# Patient Record
Sex: Female | Born: 1955 | Race: White | Hispanic: No | Marital: Married | Smoking: Current every day smoker
Health system: Southern US, Community
[De-identification: ages and names within clinical notes are randomized; demographics above are authoritative.]

## PROBLEM LIST (undated history)

## (undated) DIAGNOSIS — R7303 Prediabetes: Secondary | ICD-10-CM

## (undated) DIAGNOSIS — R112 Nausea with vomiting, unspecified: Secondary | ICD-10-CM

## (undated) DIAGNOSIS — Z8489 Family history of other specified conditions: Secondary | ICD-10-CM

## (undated) DIAGNOSIS — I10 Essential (primary) hypertension: Secondary | ICD-10-CM

## (undated) DIAGNOSIS — M1990 Unspecified osteoarthritis, unspecified site: Secondary | ICD-10-CM

## (undated) DIAGNOSIS — Z9889 Other specified postprocedural states: Secondary | ICD-10-CM

## (undated) HISTORY — PX: NECK SURGERY: SHX720

## (undated) HISTORY — PX: TONSILLECTOMY: SUR1361

## (undated) HISTORY — PX: SALPINGOOPHORECTOMY: SHX82

## (undated) HISTORY — PX: TUBAL LIGATION: SHX77

## (undated) HISTORY — PX: APPENDECTOMY: SHX54

## (undated) HISTORY — DX: Essential (primary) hypertension: I10

---

## 1997-07-06 HISTORY — PX: LIPOMA EXCISION: SHX5283

## 2004-04-05 ENCOUNTER — Encounter: Payer: Self-pay | Admitting: General Practice

## 2004-05-09 ENCOUNTER — Ambulatory Visit: Payer: Self-pay | Admitting: General Practice

## 2004-05-19 ENCOUNTER — Encounter: Payer: Self-pay | Admitting: Unknown Physician Specialty

## 2004-06-05 ENCOUNTER — Encounter: Payer: Self-pay | Admitting: Unknown Physician Specialty

## 2004-07-06 ENCOUNTER — Encounter: Payer: Self-pay | Admitting: Unknown Physician Specialty

## 2004-08-06 ENCOUNTER — Encounter: Payer: Self-pay | Admitting: Unknown Physician Specialty

## 2004-09-03 ENCOUNTER — Encounter: Payer: Self-pay | Admitting: Unknown Physician Specialty

## 2004-10-04 ENCOUNTER — Encounter: Payer: Self-pay | Admitting: Unknown Physician Specialty

## 2004-11-03 ENCOUNTER — Encounter: Payer: Self-pay | Admitting: Unknown Physician Specialty

## 2004-12-04 ENCOUNTER — Encounter: Payer: Self-pay | Admitting: Unknown Physician Specialty

## 2005-01-03 ENCOUNTER — Encounter: Payer: Self-pay | Admitting: Unknown Physician Specialty

## 2005-02-03 ENCOUNTER — Encounter: Payer: Self-pay | Admitting: Unknown Physician Specialty

## 2005-03-06 ENCOUNTER — Encounter: Payer: Self-pay | Admitting: Unknown Physician Specialty

## 2005-04-05 ENCOUNTER — Encounter: Payer: Self-pay | Admitting: Unknown Physician Specialty

## 2005-05-06 ENCOUNTER — Encounter: Payer: Self-pay | Admitting: Unknown Physician Specialty

## 2005-07-06 HISTORY — PX: KNEE SURGERY: SHX244

## 2008-11-07 ENCOUNTER — Ambulatory Visit: Payer: Self-pay

## 2008-11-20 ENCOUNTER — Encounter: Payer: Self-pay | Admitting: General Practice

## 2008-12-04 ENCOUNTER — Encounter: Payer: Self-pay | Admitting: General Practice

## 2009-01-03 ENCOUNTER — Encounter: Payer: Self-pay | Admitting: General Practice

## 2009-02-03 ENCOUNTER — Encounter: Payer: Self-pay | Admitting: General Practice

## 2009-03-06 ENCOUNTER — Encounter: Payer: Self-pay | Admitting: General Practice

## 2012-07-06 HISTORY — PX: ABDOMINAL HYSTERECTOMY: SHX81

## 2012-07-06 HISTORY — PX: HERNIA REPAIR: SHX51

## 2012-12-02 ENCOUNTER — Ambulatory Visit: Payer: Self-pay | Admitting: General Practice

## 2012-12-06 ENCOUNTER — Ambulatory Visit: Payer: Self-pay | Admitting: Gynecologic Oncology

## 2012-12-14 ENCOUNTER — Ambulatory Visit: Payer: Self-pay | Admitting: Gynecologic Oncology

## 2012-12-14 LAB — BASIC METABOLIC PANEL
BUN: 11 mg/dL (ref 7–18)
Calcium, Total: 9 mg/dL (ref 8.5–10.1)
Chloride: 108 mmol/L — ABNORMAL HIGH (ref 98–107)
Co2: 28 mmol/L (ref 21–32)
EGFR (African American): >60
EGFR (Non-African Amer.): >60
Osmolality: 278 (ref 275–301)
Potassium: 3.6 mmol/L (ref 3.5–5.1)

## 2012-12-14 LAB — CBC
HCT: 42.2 % (ref 35.0–47.0)
HGB: 14.5 g/dL (ref 12.0–16.0)
MCHC: 34.2 g/dL (ref 32.0–36.0)
Platelet: 187 10*3/uL (ref 150–440)
RDW: 14.2 % (ref 11.5–14.5)
WBC: 6.7 10*3/uL (ref 3.6–11.0)

## 2012-12-20 ENCOUNTER — Inpatient Hospital Stay: Payer: Self-pay | Admitting: Surgery

## 2012-12-21 LAB — BASIC METABOLIC PANEL
Chloride: 104 mmol/L (ref 98–107)
Co2: 28 mmol/L (ref 21–32)
EGFR (African American): >60
EGFR (Non-African Amer.): >60
Glucose: 135 mg/dL — ABNORMAL HIGH (ref 65–99)
Osmolality: 274 (ref 275–301)
Potassium: 4 mmol/L (ref 3.5–5.1)
Sodium: 137 mmol/L (ref 136–145)

## 2012-12-21 LAB — CBC WITH DIFFERENTIAL/PLATELET
Basophil #: 0 10*3/uL (ref 0.0–0.1)
Eosinophil #: 0 10*3/uL (ref 0.0–0.7)
HGB: 13 g/dL (ref 12.0–16.0)
Lymphocyte #: 1.1 10*3/uL (ref 1.0–3.6)
MCH: 29.9 pg (ref 26.0–34.0)
MCHC: 33.8 g/dL (ref 32.0–36.0)
MCV: 88 fL (ref 80–100)
Monocyte %: 7.2 %
Neutrophil #: 10.7 10*3/uL — ABNORMAL HIGH (ref 1.4–6.5)
Neutrophil %: 84.2 %
Platelet: 173 10*3/uL (ref 150–440)
RBC: 4.35 10*6/uL (ref 3.80–5.20)
WBC: 12.7 10*3/uL — ABNORMAL HIGH (ref 3.6–11.0)

## 2013-01-03 ENCOUNTER — Ambulatory Visit: Payer: Self-pay | Admitting: Gynecologic Oncology

## 2013-02-03 ENCOUNTER — Ambulatory Visit: Payer: Self-pay | Admitting: Gynecologic Oncology

## 2013-02-16 ENCOUNTER — Ambulatory Visit: Payer: Self-pay | Admitting: Gynecologic Oncology

## 2013-02-25 ENCOUNTER — Emergency Department: Payer: Self-pay | Admitting: Emergency Medicine

## 2013-02-25 LAB — URINALYSIS, COMPLETE
Bilirubin,UR: NEGATIVE
Blood: NEGATIVE
Leukocyte Esterase: NEGATIVE
Nitrite: NEGATIVE
Protein: NEGATIVE
RBC,UR: 1 /HPF (ref 0–5)
WBC UR: 1 /HPF (ref 0–5)

## 2013-02-25 LAB — CBC
HCT: 44.6 % (ref 35.0–47.0)
HGB: 15.2 g/dL (ref 12.0–16.0)
MCH: 30.2 pg (ref 26.0–34.0)
MCV: 89 fL (ref 80–100)
RBC: 5.03 10*6/uL (ref 3.80–5.20)

## 2013-02-25 LAB — COMPREHENSIVE METABOLIC PANEL
Albumin: 3.7 g/dL (ref 3.4–5.0)
Anion Gap: 5 — ABNORMAL LOW (ref 7–16)
BUN: 17 mg/dL (ref 7–18)
Calcium, Total: 9 mg/dL (ref 8.5–10.1)
Co2: 25 mmol/L (ref 21–32)
EGFR (Non-African Amer.): >60
Glucose: 121 mg/dL — ABNORMAL HIGH (ref 65–99)
Osmolality: 282 (ref 275–301)
Potassium: 3.5 mmol/L (ref 3.5–5.1)
Sodium: 140 mmol/L (ref 136–145)
Total Protein: 6.9 g/dL (ref 6.4–8.2)

## 2013-02-25 LAB — TROPONIN I: Troponin-I: <0.02 ng/mL

## 2013-08-01 LAB — LIPID PANEL
CHOLESTEROL: 191 mg/dL (ref 0–200)
HDL: 29 mg/dL — AB (ref 35–70)
Triglycerides: 261 mg/dL — AB (ref 40–160)

## 2013-08-01 LAB — HEPATIC FUNCTION PANEL
ALT: 26 U/L (ref 7–35)
AST: 20 U/L (ref 13–35)

## 2013-08-01 LAB — CBC AND DIFFERENTIAL
HEMATOCRIT: 43 % (ref 36–46)
Hemoglobin: 14.5 g/dL (ref 12.0–16.0)
Neutrophils Absolute: 6 /uL
PLATELETS: 225 10*3/uL (ref 150–399)
WBC: 10.3 10*3/mL

## 2013-08-01 LAB — BASIC METABOLIC PANEL
BUN: 24 mg/dL — AB (ref 4–21)
Creatinine: 1.5 mg/dL — AB (ref 0.5–1.1)
GLUCOSE: 84 mg/dL
Potassium: 3.9 mmol/L (ref 3.4–5.3)
Sodium: 141 mmol/L (ref 137–147)

## 2013-08-01 LAB — TSH: TSH: 3.05 u[IU]/mL (ref 0.41–5.90)

## 2014-10-26 NOTE — Discharge Summary (Signed)
PATIENT NAME:  [name], [name] MR#:  [id number] DATE OF BIRTH:  April 21, 1956  DATE OF ADMISSION:  06/17/201406/17/2014 DATE OF DISCHARGE:  12/24/2012  BRIEF HISTORY:  Ruth Higgins is a 59 year old woman recently identified with a large intra-abdominal mass, suspected to be related to her ovary. It measured approximately 40 cm in width. It appeared to be a large multiseptated lesion. Initial concern was for possible ovarian neoplasm she was seen by Dr. Jacquelyne Balint in the gynecologic oncology division. Dr. Sabra Heck recommended surgical intervention. After appropriate preoperative preparation and informed consent, she was taken to surgery on the morning of 12/20/2012 where she underwent a laparotomy. The lesion was removed without difficulty. A total abdominal hysterectomy and bilateral salpingo-oophorectomy was performed. The patient is admitted to the hospital. Postoperative course was largely unremarkable. She _____ return of bowel function, tolerating a regular diet with no particular complaints. The wounds look good. There is no sign of any infection. She is discharged home today, to be followed in the cancer center in 1 to 2 weeks.   DISCHARGE MEDICATIONS INCLUDE:   1.  Percocet 5/325 every 6 hours p.r.n.  2.  Nicotine patch 7 mg transdermally daily x 2 weeks p.r.n.   FINAL DISCHARGE DIAGNOSIS:  Ovarian tumor. Pathology pending.   SURGERY:  Transabdominal hysterectomy with bilateral salpingo-oophorectomy.  ____________________________ Micheline Maze, MD rle:nts D: 12/24/2012 06:48:00 ET T: 12/24/2012 07:29:17 ET JOB#: [id number]  cc: Weber Cooks, MD Nelda Severe. Burt Ek, MD Rodena Goldmann III, MD, <Dictator>  Rodena Goldmann MD ELECTRONICALLY SIGNED 12/25/2012 20:09

## 2014-10-26 NOTE — Op Note (Signed)
PATIENT NAME:  Ruth Higgins, Ruth Higgins MR#:  [id number] DATE OF BIRTH:  29-Jan-1956  DATE OF PROCEDURE:  12/20/2012  PREOPERATIVE DIAGNOSIS: Mainly cystic pelvic abdominal mass.   POSTOPERATIVE DIAGNOSIS: Simple cystadenoma of the left ovary.   PROCEDURE PERFORMED: Exploratory laparotomy. Total abdominal hysterectomy with bilateral salpingo-oophorectomy and appendectomy.   SURGEON: Weber Cooks, M.D.   ASSISTANT:  Dr. Pat Patrick.  ANESTHESIA: General.   COMPLICATIONS: None.   ESTIMATED BLOOD LOSS: 100 mL.   INDICATION FOR SURGERY: The patient is a 59 year old patient who presented with a large pelvic mass reaching into the abdomen. CT scan revealed a mainly cystic structure without evidence of extra ovarian disease. Therefore, the decision was made to proceed with surgery.   FINDINGS AT TIME OF SURGERY: Large mainly cystic enlargement of the left ovary. No adhesions. No papillations, no excrescences. The tumor was mobile. The right adnexa within normal limits. Uterus within normal limits. No peritoneal lesions or other pathology seen or felt in the pelvis and upper abdomen.   OPERATIVE REPORT: After adequate general anesthesia had been obtained, the patient was prepped and draped in supine position. A midline incision was placed with a sharp knife and carried down through the fascia. The peritoneum was entered. The incision was extended cephalad and caudad. Exploration was performed with the above-mentioned findings. Pelvic cytology was obtained. A Balfour retractor was placed. Then, the cystic tumor was visualized. A pursestring suture was then placed into the cyst through a 5 mm port over 4 liters of fluids were removed without spillage.  Then the left adnexa could be exteriorized. The round ligament on the left side was stitch ligated and transected. The pelvic sidewall was entered. Vessels and ureter were identified. The infundibulopelvic ligament was clamped, cut and cut. The utero-ovarian  ligament was clamped and cut and the tumor was sent for frozen section analysis. The utero-ovarian ligament was simply ligated using 0 Vicryl. The infundibulopelvic ligament was then ligated and stitch ligated again using 0 Vicryl. The bowel was then packed away from the pelvic cavity. The round ligament on the right side was stitch ligated and transected. The pelvic sidewall was entered. Vessels and ureter were identified. The infundibulopelvic ligament was clamped, cut, simply ligated and stitch ligated using 0 Vicryl. The adnexa were mobilized towards the uterus. Then the anterior fold of the peritoneum was incised. The bladder was freed from lower uterine segment, cervix, and upper vagina. Dissection was advanced as necessary. The uterine vessels were skeletonized on either side, clamped, cut and stitch ligated with 0 Vicryl. The uterus was then freed from the remainder of the cardinal and uterosacral ligaments by serially placing clamps and cutting pedicles which were stitch ligated with 0 Vicryl. The last two pedicles contained the vaginal fornix. Thus, the uterus and right adnexa were removed completely. The vagina was closed with interrupted sutures using 0 Vicryl. Irrigation of the pelvis was performed and adequate hemostasis was confirmed.   Frozen section revealed a mucinous tumor. Therefore, the decision was made to proceed with appendectomy. The appendix appeared normal. The mesenteriolum was clamped in pedicles, cut and ligated. The base of the appendix was ligated x 2, using 0 Vicryl. Two pursestring sutures were placed. The appendix was then removed and the stump buried in the pursestring suture. Hemostasis was noted to be adequate.   Lap, sponges and retractors were removed. Again, hemostasis was noted to be adequate in all areas. The fascia was closed with a running loop PDS suture starting from the top  as well as the bottom and joining in the midline. Irrigation of the subcutaneous tissue was  obtained and adequate hemostasis confirmed before it was reapproximated with 2-0 Vicryl. 4-0 Vicryl was used to close the skin in a subcuticular fashion. Dermabond was applied.   The patient tolerated the procedure well and was taken to the recovery room in satisfactory condition. Postoperative urine was clear. Pad, sponge, needle, and instrument counts were correct x 2.     ____________________________ Weber Cooks, MD bem:rw D: 12/21/2012 10:30:34 ET T: 12/21/2012 12:19:44 ET JOB#: [id number]  cc: Weber Cooks, MD, <Dictator> Weber Cooks MD ELECTRONICALLY SIGNED 12/27/2012 8:47

## 2015-03-15 DIAGNOSIS — F419 Anxiety disorder, unspecified: Secondary | ICD-10-CM | POA: Insufficient documentation

## 2015-03-15 DIAGNOSIS — J309 Allergic rhinitis, unspecified: Secondary | ICD-10-CM | POA: Insufficient documentation

## 2015-03-15 DIAGNOSIS — I10 Essential (primary) hypertension: Secondary | ICD-10-CM | POA: Insufficient documentation

## 2015-03-15 DIAGNOSIS — Z780 Asymptomatic menopausal state: Secondary | ICD-10-CM | POA: Insufficient documentation

## 2015-03-15 DIAGNOSIS — E785 Hyperlipidemia, unspecified: Secondary | ICD-10-CM | POA: Insufficient documentation

## 2015-03-15 DIAGNOSIS — Z720 Tobacco use: Secondary | ICD-10-CM | POA: Insufficient documentation

## 2015-03-15 DIAGNOSIS — F320 Major depressive disorder, single episode, mild: Secondary | ICD-10-CM | POA: Insufficient documentation

## 2015-03-15 DIAGNOSIS — M1712 Unilateral primary osteoarthritis, left knee: Secondary | ICD-10-CM | POA: Insufficient documentation

## 2015-03-15 DIAGNOSIS — G4700 Insomnia, unspecified: Secondary | ICD-10-CM | POA: Insufficient documentation

## 2015-03-15 DIAGNOSIS — Z8271 Family history of polycystic kidney: Secondary | ICD-10-CM | POA: Insufficient documentation

## 2015-03-18 ENCOUNTER — Encounter: Payer: Self-pay | Admitting: Family Medicine

## 2015-03-18 ENCOUNTER — Ambulatory Visit (INDEPENDENT_AMBULATORY_CARE_PROVIDER_SITE_OTHER): Payer: 59 | Admitting: Family Medicine

## 2015-03-18 VITALS — BP 140/84 | HR 64 | Temp 98.4°F | Resp 16 | Wt 236.0 lb

## 2015-03-18 DIAGNOSIS — I10 Essential (primary) hypertension: Secondary | ICD-10-CM | POA: Diagnosis not present

## 2015-03-18 DIAGNOSIS — F419 Anxiety disorder, unspecified: Secondary | ICD-10-CM | POA: Diagnosis not present

## 2015-03-18 DIAGNOSIS — R7309 Other abnormal glucose: Secondary | ICD-10-CM | POA: Diagnosis not present

## 2015-03-18 DIAGNOSIS — R7303 Prediabetes: Secondary | ICD-10-CM

## 2015-03-18 MED ORDER — LORAZEPAM 0.5 MG PO TABS: 0.5000 mg | ORAL_TABLET | Freq: Every day | ORAL | Status: DC

## 2015-03-18 NOTE — Progress Notes (Signed)
Patient ID: Ruth Higgins, female   DOB: 1956-06-10, 59 y.o.   MRN: [id number]       Patient: Ruth Higgins Female    DOB: December 10, 1955   59 y.o.   MRN: [id number] Visit Date: 03/18/2015  Today's Provider: [name], MD   Chief Complaint  Patient presents with  . Hypertension    follow-up  . Anxiety    follow-up  . glucose intolerance    follow-up   Subjective:    Hypertension This is a chronic problem. The current episode started more than 1 year ago. The problem has been gradually improving since onset. The problem is controlled. Associated symptoms include anxiety and sweats. Pertinent negatives include no blurred vision, chest pain, headaches, palpitations or shortness of breath. (Anxiety as much" pt stated) Risk factors for coronary artery disease include obesity (pre-diabetes). Past treatments include beta blockers (metoprolol xl 25 mg daily).  Anxiety Presents for follow-up visit. Patient reports no chest pain, insomnia, irritability, nervous/anxious behavior, palpitations or shortness of breath. The quality of sleep is fair.     patient is feeling much less stress since retiring from [employer] [facility] [employer] [employer] [facility] pediatrics in the past month. 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 She is having also marital issues and has moved out and living with her husband of 8 years. She denies any physical or verbal abuse. She is very nonchalant about the situation.  Labs were ordered on 09/20/2014 which were not drawn.  Last bas on 08/01/2013      Allergies  Allergen Reactions  . Ceclor  [Cefaclor] Hives  . Demerol  [Meperidine]   . Erythromycin   . Flagyl  [Metronidazole]   . Hydrochlorothiazide     severe cramping in her legs/knee/calves  . Hydrocodone-Acetaminophen   . Penicillin G Hives  . Penicillins   . Sulfa Antibiotics Swelling   Previous Medications   IBUPROFEN (ADVIL,MOTRIN) 400 MG TABLET    Take by mouth.   LISINOPRIL (PRINIVIL,ZESTRIL) 40 MG TABLET    Take by mouth.   LORAZEPAM (ATIVAN) 0.5 MG TABLET    Take by mouth.   METOPROLOL SUCCINATE (TOPROL-XL) 25 MG 24 HR TABLET    Take by mouth.   VENLAFAXINE (EFFEXOR) 75 MG TABLET    Take by mouth.    Review of Systems  Constitutional: Negative.  Negative for irritability.  HENT: Negative.        After going to sleep, a lot of saliva, at night" pt stated  Eyes: Negative.  Negative for blurred vision.  Respiratory: Positive for cough. Negative for shortness of breath.   Cardiovascular: Positive for leg swelling. Negative for chest pain and palpitations.  Endocrine: Negative.   Allergic/Immunologic: Negative.   Neurological: Negative for headaches.  Hematological: Negative.   Psychiatric/Behavioral: Negative.  The patient is not nervous/anxious and does not have insomnia.     Social History  Substance Use Topics  . Smoking status: Current Every Day Smoker -- 1.00 packs/day for 45 years    Types: Cigarettes  . Smokeless tobacco: Not on file  . Alcohol Use: No   Objective:   BP 140/84 mmHg  Pulse 64  Temp(Src) 98.4 F (36.9 C) (Oral)  Resp 16  Wt 236 lb (107.049 kg)  Physical Exam  Constitutional: She is oriented to person, place, and time. She appears well-developed and well-nourished.  Obese white female in no acute distress. Pear shaped.  HENT:  Head: Normocephalic and atraumatic.  Right Ear: External ear normal.  Left Ear: External ear normal.  Nose: Nose normal.  Eyes: Conjunctivae are normal.  Neck: Neck supple.  Cardiovascular: Normal rate, regular rhythm and normal heart sounds.   Pulmonary/Chest: Effort normal and breath sounds normal.  Abdominal: Soft.  Neurological: She is alert and oriented to person, place, and time.  Skin: Skin is warm and dry.  Psychiatric: She has a normal mood and affect. Her behavior is normal. Thought content normal.        Assessment & Plan:     1. Essential  hypertension Improving  2. Anxiety Markedly improved - LORazepam (ATIVAN) 0.5 MG tablet; Take 1 tablet (0.5 mg total) by mouth at bedtime.  Dispense: 30 tablet; Refill: 5  3. Glucose intolerance (pre-diabetes)  Patient instructed to work hard on diet exercise and weight loss now that she has time from her work schedule.  4. Anxiety, mild  5. Obesity 6. Osteoarthritis       [name], MD  [facility]

## 2015-03-28 ENCOUNTER — Encounter: Payer: Self-pay | Admitting: Family Medicine

## 2015-05-20 ENCOUNTER — Ambulatory Visit (INDEPENDENT_AMBULATORY_CARE_PROVIDER_SITE_OTHER): Payer: 59 | Admitting: Family Medicine

## 2015-05-20 ENCOUNTER — Encounter: Payer: Self-pay | Admitting: Family Medicine

## 2015-05-20 VITALS — BP 132/84 | HR 62 | Temp 97.7°F | Resp 16 | Wt 231.0 lb

## 2015-05-20 DIAGNOSIS — E668 Other obesity: Secondary | ICD-10-CM | POA: Diagnosis not present

## 2015-05-20 DIAGNOSIS — E785 Hyperlipidemia, unspecified: Secondary | ICD-10-CM

## 2015-05-20 DIAGNOSIS — R739 Hyperglycemia, unspecified: Secondary | ICD-10-CM | POA: Diagnosis not present

## 2015-05-20 DIAGNOSIS — F320 Major depressive disorder, single episode, mild: Secondary | ICD-10-CM | POA: Diagnosis not present

## 2015-05-20 DIAGNOSIS — IMO0002 Reserved for concepts with insufficient information to code with codable children: Secondary | ICD-10-CM

## 2015-05-20 DIAGNOSIS — I10 Essential (primary) hypertension: Secondary | ICD-10-CM | POA: Diagnosis not present

## 2015-05-20 NOTE — Progress Notes (Signed)
Patient ID: Ruth Higgins, female   DOB: [birth year], 59 y.o.   MRN: [id number]    Subjective:  HPI  Hypertension, follow-up:  BP Readings from Last 3 Encounters:  05/20/15 132/84  03/18/15 140/84  10/01/14 152/90    She was last seen for hypertension 2 months ago.  BP at that visit was 140/84. Management since that visit includes none. She reports good compliance with treatment. She is not having side effects.  She is exercising. Yoga once a week. Outside blood pressures are not being checked. Patient denies chest pain, chest pressure/discomfort, dyspnea, exertional chest pressure/discomfort, fatigue, irregular heart beat, lower extremity edema and palpitations.   Cardiovascular risk factors include dyslipidemia, hypertension, obesity (BMI >= 30 kg/m2) and smoking/ tobacco exposure.    Wt Readings from Last 3 Encounters:  05/20/15 231 lb (104.781 kg)  03/18/15 236 lb (107.049 kg)  10/01/14 219 lb (99.338 kg)   ------------------------------------------------------------------------ Pt reports that she is tired all the time.     Prior to Admission medications   Medication Sig Start Date End Date Taking? Authorizing Provider  ibuprofen (ADVIL,MOTRIN) 400 MG tablet Take by mouth.   Yes Historical Provider, MD  lisinopril (PRINIVIL,ZESTRIL) 40 MG tablet Take by mouth. 06/12/14  Yes Historical Provider, MD  LORazepam (ATIVAN) 0.5 MG tablet Take 1 tablet (0.5 mg total) by mouth at bedtime. 03/18/15  Yes Bodie Abernethy Maceo Pro., MD  metoprolol succinate (TOPROL-XL) 25 MG 24 hr tablet Take by mouth. 08/21/14  Yes Historical Provider, MD  venlafaxine (EFFEXOR) 75 MG tablet Take by mouth. 06/12/14  Yes Historical Provider, MD    Patient Active Problem List   Diagnosis Date Noted  . Anxiety, mild 03/15/2015  . Allergic rhinitis 03/15/2015  . Depression, major, single episode, mild ([place]) 03/15/2015  . Essential (primary) hypertension 03/15/2015  . Fam hx-polycystic kidney  03/15/2015  . HLD (hyperlipidemia) 03/15/2015  . Cannot sleep 03/15/2015  . Menopause 03/15/2015  . Extreme obesity ([place]) 03/15/2015  . Arthritis of knee, degenerative 03/15/2015  . Adult BMI 30+ 03/15/2015  . Arthritis, degenerative 03/15/2015  . Current tobacco use 03/15/2015    History reviewed. No pertinent past medical history.  Social History   Social History  . Marital Status: Married    Spouse Name: N/A  . Number of Children: N/A  . Years of Education: N/A   Occupational History  . Not on file.   Social History Main Topics  . Smoking status: Current Every Day Smoker -- 1.00 packs/day for 45 years    Types: Cigarettes  . Smokeless tobacco: Not on file  . Alcohol Use: No  . Drug Use: No  . Sexual Activity: Not on file   Other Topics Concern  . Not on file   Social History Narrative    Allergies  Allergen Reactions  . Ceclor  [Cefaclor] Hives  . Demerol  [Meperidine]   . Erythromycin   . Flagyl  [Metronidazole]   . Hydrochlorothiazide     severe cramping in her legs/knee/calves  . Hydrocodone-Acetaminophen   . Penicillin G Hives  . Penicillins   . Sulfa Antibiotics Swelling    Review of Systems  Constitutional: Positive for malaise/fatigue.  HENT: Negative.   Eyes: Negative.   Respiratory: Negative.   Cardiovascular: Negative.   Gastrointestinal: Negative.   Genitourinary: Negative.   Musculoskeletal: Negative.   Skin: Negative.   Neurological: Negative.   Endo/Heme/Allergies: Negative.   Psychiatric/Behavioral: Negative.     Immunization History  Administered Date(s) Administered  .  Tdap 07/26/2013   Objective:  BP 132/84 mmHg  Pulse 62  Temp(Src) 97.7 F (36.5 C) (Oral)  Resp 16  Wt 231 lb (104.781 kg)  Physical Exam  Constitutional: She is oriented to person, place, and time and well-developed, well-nourished, and in no distress.  HENT:  Head: Normocephalic and atraumatic.  Right Ear: External ear normal.  Left Ear: External  ear normal.  Nose: Nose normal.  Eyes: Conjunctivae are normal.  Neck: Neck supple.  Cardiovascular: Normal rate, regular rhythm and normal heart sounds.   Pulmonary/Chest: Effort normal and breath sounds normal.  Abdominal: Soft.  Neurological: She is alert and oriented to person, place, and time.  Skin: Skin is warm and dry.  Psychiatric: Mood, memory, affect and judgment normal.    Lab Results  Component Value Date   WBC 10.3 08/01/2013   HGB 14.5 08/01/2013   HCT 43 08/01/2013   PLT 225 08/01/2013   GLUCOSE 121* 02/25/2013   CHOL 191 08/01/2013   TRIG 261* 08/01/2013   HDL 29* 08/01/2013   TSH 3.05 08/01/2013    CMP     Component Value Date/Time   NA 141 08/01/2013   NA 140 02/25/2013 1746   K 3.9 08/01/2013   K 3.5 02/25/2013 1746   CL 110* 02/25/2013 1746   CO2 25 02/25/2013 1746   GLUCOSE 121* 02/25/2013 1746   BUN 24* 08/01/2013   BUN 17 02/25/2013 1746   CREATININE 1.5* 08/01/2013   CREATININE 0.96 02/25/2013 1746   CALCIUM 9.0 02/25/2013 1746   PROT 6.9 02/25/2013 1746   ALBUMIN 3.7 02/25/2013 1746   AST 20 08/01/2013   AST 21 02/25/2013 1746   ALT 26 08/01/2013   ALT 32 02/25/2013 1746   ALKPHOS 89 02/25/2013 1746   BILITOT 0.4 02/25/2013 1746   GFRNONAA >60 02/25/2013 1746   GFRAA >60 02/25/2013 1746    Assessment and Plan :  1. Essential (primary) hypertension  - TSH - CBC with Differential/Platelet - Comprehensive metabolic panel  2. 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  3. HLD (hyperlipidemia)  - Lipid Panel With LDL/HDL Ratio - Comprehensive metabolic panel  4. Adult BMI 30+/morbid obesity   5. Hyperglycemia  - Hemoglobin A1c - Comprehensive metabolic panel 6.Fatigue Likely multifactorial.Pt does not follow any diet or exercise program. She has retired from her regular job and is encouraged to exercise daily.MDD cpi;d be a major contributor. I have done the exam and reviewed the above chart and it is  accurate to the best of my knowledge.  Miguel Aschoff MD [facility] 05/20/2015 4:30 PM

## 2015-07-03 ENCOUNTER — Other Ambulatory Visit: Payer: Self-pay | Admitting: Family Medicine

## 2015-08-27 DIAGNOSIS — L821 Other seborrheic keratosis: Secondary | ICD-10-CM | POA: Diagnosis not present

## 2015-08-27 DIAGNOSIS — L7211 Pilar cyst: Secondary | ICD-10-CM | POA: Diagnosis not present

## 2015-08-27 DIAGNOSIS — L670 Trichorrhexis nodosa: Secondary | ICD-10-CM | POA: Diagnosis not present

## 2015-09-12 ENCOUNTER — Other Ambulatory Visit: Payer: Self-pay | Admitting: Family Medicine

## 2015-11-18 ENCOUNTER — Ambulatory Visit (INDEPENDENT_AMBULATORY_CARE_PROVIDER_SITE_OTHER): Payer: 59 | Admitting: Family Medicine

## 2015-11-18 VITALS — BP 140/82 | HR 84 | Temp 98.1°F | Resp 16 | Wt 237.0 lb

## 2015-11-18 DIAGNOSIS — Z800 Family history of malignant neoplasm of digestive organs: Secondary | ICD-10-CM | POA: Diagnosis not present

## 2015-11-18 DIAGNOSIS — I10 Essential (primary) hypertension: Secondary | ICD-10-CM

## 2015-11-18 DIAGNOSIS — E785 Hyperlipidemia, unspecified: Secondary | ICD-10-CM

## 2015-11-18 DIAGNOSIS — F320 Major depressive disorder, single episode, mild: Secondary | ICD-10-CM | POA: Diagnosis not present

## 2015-11-18 DIAGNOSIS — F17200 Nicotine dependence, unspecified, uncomplicated: Secondary | ICD-10-CM

## 2015-11-18 DIAGNOSIS — Z720 Tobacco use: Secondary | ICD-10-CM

## 2015-11-18 DIAGNOSIS — R739 Hyperglycemia, unspecified: Secondary | ICD-10-CM

## 2015-11-18 DIAGNOSIS — R140 Abdominal distension (gaseous): Secondary | ICD-10-CM | POA: Diagnosis not present

## 2015-11-18 NOTE — Progress Notes (Signed)
Patient ID: Ruth Higgins, female   DOB: [birth year], 60 y.o.   MRN: [id number]   Ruth Higgins  MRN: [id number] DOB: 07-Jan-1956  Subjective:  HPI   The patient is a 60 year old female who presents for follow up of her hypertension, hyperglycemia, cholesterol and hyperglycemia.  The patient was supposed to have her labs done after the last visit.  She went last night and had some of the tests done but not all.   Patient continues to smoke. She has smoked at least one pack per day for the past 45 years. She gets no regular exercise. Her 43 year old brother was recently diagnosed with colon cancer. She  has always declined screening colonoscopy in past years. Patient Active Problem List   Diagnosis Date Noted  . Hyperglycemia 05/20/2015  . Anxiety, mild 03/15/2015  . Allergic rhinitis 03/15/2015  . Depression, major, single episode, mild ([place]) 03/15/2015  . Essential (primary) hypertension 03/15/2015  . Fam hx-polycystic kidney 03/15/2015  . HLD (hyperlipidemia) 03/15/2015  . Cannot sleep 03/15/2015  . Menopause 03/15/2015  . Extreme obesity ([place]) 03/15/2015  . Arthritis of knee, degenerative 03/15/2015  . Adult BMI 30+ 03/15/2015  . Arthritis, degenerative 03/15/2015  . Current tobacco use 03/15/2015    No past medical history on file.  Social History   Social History  . Marital Status: Married    Spouse Name: N/A  . Number of Children: N/A  . Years of Education: N/A   Occupational History  . Not on file.   Social History Main Topics  . Smoking status: Current Every Day Smoker -- 1.00 packs/day for 45 years    Types: Cigarettes  . Smokeless tobacco: Not on file  . Alcohol Use: No  . Drug Use: No  . Sexual Activity: Not on file   Other Topics Concern  . Not on file   Social History Narrative    Outpatient Prescriptions Prior to Visit  Medication Sig Dispense Refill  . ibuprofen (ADVIL,MOTRIN) 400 MG tablet Take by mouth.    Marland Kitchen lisinopril  (PRINIVIL,ZESTRIL) 40 MG tablet TAKE 1 TABLET BY MOUTH DAILY 30 tablet 12  . LORazepam (ATIVAN) 0.5 MG tablet Take 1 tablet (0.5 mg total) by mouth at bedtime. 30 tablet 5  . metoprolol succinate (TOPROL-XL) 25 MG 24 hr tablet TAKE 1 TABLET BY MOUTH ONCE DAILY 30 tablet 5  . venlafaxine (EFFEXOR) 75 MG tablet TAKE 1 TABLET BY MOUTH DAILY 30 tablet 5   No facility-administered medications prior to visit.    Allergies  Allergen Reactions  . Ceclor  [Cefaclor] Hives  . Demerol  [Meperidine]   . Erythromycin   . Flagyl  [Metronidazole]   . Hydrochlorothiazide     severe cramping in her legs/knee/calves  . Hydrocodone-Acetaminophen   . Penicillin G Hives  . Penicillins   . Sulfa Antibiotics Swelling    Review of Systems  Constitutional: Positive for malaise/fatigue. Negative for fever.  Eyes: Negative.   Respiratory: Positive for cough. Negative for sputum production, shortness of breath and wheezing.   Cardiovascular: Positive for leg swelling. Negative for chest pain, palpitations and orthopnea.  Gastrointestinal: Negative.        Patient complains of bloating  Genitourinary: Negative.   Musculoskeletal: Positive for back pain and joint pain. Negative for myalgias and neck pain.  Skin: Negative.   Neurological: Negative for dizziness, weakness and headaches.  Endo/Heme/Allergies: Negative.   Psychiatric/Behavioral: Negative.    Objective:  BP 140/82 mmHg  Pulse 84  Temp(Src) 98.1 F (36.7 C) (Oral)  Resp 16  Wt 237 lb (107.502 kg)  Physical Exam  Constitutional: She is oriented to person, place, and time and well-developed, well-nourished, and in no distress.  Morbidly obese white female in no acute distress.  HENT:  Head: Normocephalic and atraumatic.  Right Ear: External ear normal.  Left Ear: External ear normal.  Nose: Nose normal.  Eyes: Conjunctivae are normal. Pupils are equal, round, and reactive to light.  Neck: Normal range of motion. Neck supple.    Cardiovascular: Normal rate, regular rhythm and normal heart sounds.   Pulmonary/Chest: Effort normal and breath sounds normal.  Abdominal: Soft.  Musculoskeletal: She exhibits edema (1+ bilaterally).  Neurological: She is alert and oriented to person, place, and time.  Skin: Skin is warm and dry.  Psychiatric: Mood, memory, affect and judgment normal.    Assessment and Plan :   1. Essential (primary) hypertension   2. HLD (hyperlipidemia)  - Lipid Panel With LDL/HDL Ratio  3. Depression, major, single episode, mild ([facility]) Apparently stable.  4. Hyperglycemia  - Hemoglobin A1c  5. Bloating  - Ambulatory referral to General Surgery  6. Family history of colon cancer Patient has declined screening colonoscopy in the past. She agrees to this now. - Ambulatory referral to General Surgery More than 50% of this visit is spent in counseling regarding these issues. 7. Smoker Would like to do a low dose CT chest if insurance will allow. Patient does not really have any interest in stopping smoking.  I have done the exam and reviewed the above chart and it is accurate to the best of my knowledge.  Miguel Aschoff MD [facility] 11/18/2015 4:19 PM

## 2015-11-19 ENCOUNTER — Telehealth: Payer: Self-pay | Admitting: Family Medicine

## 2015-11-19 DIAGNOSIS — Z87891 Personal history of nicotine dependence: Secondary | ICD-10-CM

## 2015-11-19 NOTE — Telephone Encounter (Signed)
Pt states that she would like to go ahead with low dose chest CT because she has been a smoker for years.She states this was talked about at last office visit

## 2015-11-19 NOTE — Telephone Encounter (Signed)
Order put in-aa 

## 2015-11-21 ENCOUNTER — Encounter: Payer: Self-pay | Admitting: General Surgery

## 2015-11-21 ENCOUNTER — Ambulatory Visit (INDEPENDENT_AMBULATORY_CARE_PROVIDER_SITE_OTHER): Payer: 59 | Admitting: General Surgery

## 2015-11-21 VITALS — BP 132/80 | HR 68 | Resp 12 | Ht 61.0 in | Wt 233.0 lb

## 2015-11-21 DIAGNOSIS — R1900 Intra-abdominal and pelvic swelling, mass and lump, unspecified site: Secondary | ICD-10-CM

## 2015-11-21 DIAGNOSIS — R221 Localized swelling, mass and lump, neck: Secondary | ICD-10-CM | POA: Diagnosis not present

## 2015-11-21 NOTE — Patient Instructions (Signed)
Patient has been scheduled for a CT chest/abdomen/pelvis with contrast at [facility] for 11-28-15 at 11 am (arrive 10:30 am). Prep: only water 4 hours prior and pick up prep kit. Patient verbalizes understanding.

## 2015-11-21 NOTE — Progress Notes (Signed)
Patient ID: Ruth Higgins, female   DOB: July 09, 1955, 60 y.o.   MRN: [id number]  Chief Complaint  Patient presents with  . Other    Abdominal pain    HPI Ruth Higgins is a 60 y.o. female here today for an evaluation for abdominal bloating.  The bloating has been going on for at least a year. She feels full all the time. She does eat one meal a day. She feels she is gaining weight.  No nausea. Denies pain. 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 It was removed in 1999 by Dr. Pryor Ochoa. The patient reports that the mass was described as extending down to the top of the left lung. It apparently recurred shortly after her original resection but has remained stable for the last several years.  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  The patient reports she is not one to go to the doctor's. The increasing abdominal girth prompted her desire for assessment. In 2014 a large unilocular cyst was removed that involved the left ovaries. Operative note describes 4 L of fluid. Cytology and pathology were negative for malignancy. The patient reports that she lost "35 pounds" prior to and after surgery. At that time she was experiencing significant early satiety, not clearly noted at this time.  I personally reviewed the patient's history HPI  Past Medical History  Diagnosis Date  . Hypertension     Past Surgical History  Procedure Laterality Date  . Knee surgery Bilateral 2007  . Neck surgery    . Tonsillectomy    . Lipoma excision  1999    of the left side of the neck  . Appendectomy    . Salpingoophorectomy Bilateral   . Tubal ligation    . Abdominal hysterectomy  2014    Dr. Sabra Heck  . Hernia repair  2014    Dr. Pat Patrick    Family History  Problem Relation Age of Onset  . Stroke Mother   . Alzheimer's disease Father   . Heart disease Father   . Hypothyroidism Sister   . Hypertension Brother    . Polycystic kidney disease Brother   . Depression Sister   . Hypertension Sister   . Anxiety disorder Sister     Social History Social History  Substance Use Topics  . Smoking status: Current Every Day Smoker -- 1.00 packs/day for 45 years    Types: Cigarettes  . Smokeless tobacco: None  . Alcohol Use: No    Allergies  Allergen Reactions  . Ceclor  [Cefaclor] Hives  . Demerol  [Meperidine]   . Erythromycin   . Flagyl  [Metronidazole]   . Hydrochlorothiazide     severe cramping in her legs/knee/calves  . Hydrocodone-Acetaminophen   . Penicillin G Hives  . Penicillins   . Sulfa Antibiotics Swelling    Current Outpatient Prescriptions  Medication Sig Dispense Refill  . ibuprofen (ADVIL,MOTRIN) 400 MG tablet Take by mouth.    Marland Kitchen lisinopril (PRINIVIL,ZESTRIL) 40 MG tablet TAKE 1 TABLET BY MOUTH DAILY 30 tablet 12  . LORazepam (ATIVAN) 0.5 MG tablet Take 1 tablet (0.5 mg total) by mouth at bedtime. 30 tablet 5  . metoprolol succinate (TOPROL-XL) 25 MG 24 hr tablet TAKE 1 TABLET BY MOUTH ONCE DAILY 30 tablet 5  . venlafaxine (EFFEXOR) 75 MG tablet TAKE 1 TABLET BY MOUTH DAILY 30 tablet 5   No current facility-administered  medications for this visit.    Review of Systems Review of Systems  Constitutional: Negative.   Respiratory: Negative.   Cardiovascular: Negative.   Gastrointestinal: Positive for abdominal distention. Negative for vomiting and rectal pain.  Endocrine: Negative for cold intolerance.  Genitourinary: Negative.   Musculoskeletal: Negative.   Allergic/Immunologic: Negative.   Neurological: Negative.   Hematological: Negative.   Psychiatric/Behavioral: Negative for agitation.    Blood pressure 132/80, pulse 68, resp. rate 12, height '5\' 1"'$  (1.549 m), weight 233 lb (105.688 kg).  The patient's weight is down 4 pounds from her September 2016 exam with Miguel Aschoff, M.D.  The patient's weight on admission prior to her GYN surgery on 12/14/2012 was 206  pounds  Physical Exam Physical Exam  Constitutional: She is oriented to person, place, and time. She appears well-developed and well-nourished.  HENT:  Head: Normocephalic and atraumatic.  Eyes: Conjunctivae are normal. No scleral icterus.  Neck: Neck supple.    9x5 lipoma left side of neck  Cardiovascular: Normal rate, regular rhythm and normal heart sounds.   Pulmonary/Chest: Effort normal and breath sounds normal.  Abdominal: Soft. Normal appearance. She exhibits distension. She exhibits no mass. There is no tenderness. There is no rebound and no guarding.    Diastasis recti   Musculoskeletal: Normal range of motion. She exhibits no edema.  Lymphadenopathy:    She has no cervical adenopathy.  Neurological: She is alert and oriented to person, place, and time.  Skin: Skin is warm and dry.  Psychiatric: She has a normal mood and affect. Her behavior is normal. Thought content normal.    Data Reviewed 12/09/2012 pathology: Diagnosis:  Part A: LEFT FALLOPIAN TUBE AND OVARY:  - OVARY WITH BENIGN UNILOCULAR MUCINOUS CYSTADENOMA.  - FALLOPIAN TUBE WITH NO PATHOLOGIC CHANGES.  .  Part B: UTERUS, RIGHT TUBE AND OVARY :  - CERVIX AND ENDOCERVIX WITH NABOTHIAN CYSTS AND SQUAMOUS  METAPLASIA.  - BENIGN ENDOMETRIAL POLYP.  - ATROPHIC AND FOCAL WEAKLY PROLIFERATIVE PHASE ENDOMETRIUM AND  SEROSAL ADHESIONS.  - LEIOMYOMATA, LARGEST MEASURES 1.0 CM, NO ATYPIA, NECROSIS OR  INCREASED MITOTIC COUNT.  - OVARY WITH STROMAL HYPERPLASIA.  - FALLOPIAN TUBE WITH NO PATHOLOGIC CHANGES.  - NO HYPERPLASIA OR MALIGNANCY IDENTIFIED.  Marland Kitchen  Part C: APPENDIX:  - BENIGN APPENDIX AND ADJACENT DIVERTICULUM.  - NEGATIVE FOR DYSPLASIA AND MALIGNANCY.   Assessment    Recurrent abdominal distention, some early satiety. Previous benign ovarian cystic lesion status post TAH/BSO.  Recurrent left neck mass, likely lipoma.    Plan    Will arrange for a CT scan to assess both the neck mass which is  unlikely related to the lung as well as the abdomen due to her marked abdominal bloating, weight gain and passed surgical history.     Patient has been scheduled for a CT chest/abdomen/pelvis with contrast at [facility] for 11-28-15 at 11 am (arrive 10:30 am). Prep: only water 4 hours prior and pick up prep kit. Patient verbalizes understanding.    PCP: Dr. Juliette Mangle, [place] Gleason 11/23/2015, 2:37 PM

## 2015-11-23 DIAGNOSIS — R221 Localized swelling, mass and lump, neck: Secondary | ICD-10-CM | POA: Insufficient documentation

## 2015-11-23 DIAGNOSIS — R1900 Intra-abdominal and pelvic swelling, mass and lump, unspecified site: Secondary | ICD-10-CM | POA: Insufficient documentation

## 2015-11-28 ENCOUNTER — Ambulatory Visit
Admission: RE | Admit: 2015-11-28 | Discharge: 2015-11-28 | Disposition: A | Discharge status: Home / Self Care | Payer: 59 | Source: Ambulatory Visit | Attending: General Surgery | Admitting: General Surgery

## 2015-11-28 DIAGNOSIS — R1900 Intra-abdominal and pelvic swelling, mass and lump, unspecified site: Secondary | ICD-10-CM | POA: Insufficient documentation

## 2015-11-28 DIAGNOSIS — D1779 Benign lipomatous neoplasm of other sites: Secondary | ICD-10-CM | POA: Diagnosis not present

## 2015-11-28 DIAGNOSIS — R221 Localized swelling, mass and lump, neck: Secondary | ICD-10-CM | POA: Diagnosis not present

## 2015-11-28 DIAGNOSIS — K5730 Diverticulosis of large intestine without perforation or abscess without bleeding: Secondary | ICD-10-CM | POA: Diagnosis not present

## 2015-11-28 DIAGNOSIS — R140 Abdominal distension (gaseous): Secondary | ICD-10-CM | POA: Diagnosis not present

## 2015-11-28 DIAGNOSIS — D3502 Benign neoplasm of left adrenal gland: Secondary | ICD-10-CM | POA: Diagnosis not present

## 2015-11-28 DIAGNOSIS — R6881 Early satiety: Secondary | ICD-10-CM | POA: Diagnosis not present

## 2015-11-28 DIAGNOSIS — R222 Localized swelling, mass and lump, trunk: Secondary | ICD-10-CM | POA: Diagnosis not present

## 2015-11-28 DIAGNOSIS — K429 Umbilical hernia without obstruction or gangrene: Secondary | ICD-10-CM | POA: Insufficient documentation

## 2015-11-28 LAB — POCT I-STAT CREATININE: CREATININE: 1.1 mg/dL — AB (ref 0.44–1.00)

## 2015-11-28 MED ORDER — IOPAMIDOL (ISOVUE-370) INJECTION 76%
100.0000 mL | Freq: Once | INTRAVENOUS | Status: AC | PRN
  Administered 2015-11-28: 100 mL via INTRAVENOUS

## 2015-12-05 ENCOUNTER — Ambulatory Visit (INDEPENDENT_AMBULATORY_CARE_PROVIDER_SITE_OTHER): Payer: 59 | Admitting: General Surgery

## 2015-12-05 ENCOUNTER — Encounter: Payer: Self-pay | Admitting: General Surgery

## 2015-12-05 VITALS — BP 132/74 | HR 78 | Resp 14 | Ht 61.0 in | Wt 232.0 lb

## 2015-12-05 DIAGNOSIS — R221 Localized swelling, mass and lump, neck: Secondary | ICD-10-CM | POA: Diagnosis not present

## 2015-12-05 DIAGNOSIS — Z1211 Encounter for screening for malignant neoplasm of colon: Secondary | ICD-10-CM | POA: Diagnosis not present

## 2015-12-05 NOTE — Patient Instructions (Signed)
Colonoscopy A colonoscopy is an exam to look at the entire large intestine (colon). This exam can help find problems such as tumors, polyps, inflammation, and areas of bleeding. The exam takes about 1 hour.  LET YOUR HEALTH CARE PROVIDER KNOW ABOUT:   Any allergies you have.  All medicines you are taking, including vitamins, herbs, eye drops, creams, and over-the-counter medicines.  Previous problems you or members of your family have had with the use of anesthetics.  Any blood disorders you have.  Previous surgeries you have had.  Medical conditions you have. RISKS AND COMPLICATIONS  Generally, this is a safe procedure. However, as with any procedure, complications can occur. Possible complications include:  Bleeding.  Tearing or rupture of the colon wall.  Reaction to medicines given during the exam.  Infection (rare). BEFORE THE PROCEDURE   Ask your health care provider about changing or stopping your regular medicines.  You may be prescribed an oral bowel prep. This involves drinking a large amount of medicated liquid, starting the day before your procedure. The liquid will cause you to have multiple loose stools until your stool is almost clear or light green. This cleans out your colon in preparation for the procedure.  Do not eat or drink anything else once you have started the bowel prep, unless your health care provider tells you it is safe to do so.  Arrange for someone to drive you home after the procedure. PROCEDURE   You will be given medicine to help you relax (sedative).  You will lie on your side with your knees bent.  A long, flexible tube with a light and camera on the end (colonoscope) will be inserted through the rectum and into the colon. The camera sends video back to a computer screen as it moves through the colon. The colonoscope also releases carbon dioxide gas to inflate the colon. This helps your health care provider see the area better.  During  the exam, your health care provider may take a small tissue sample (biopsy) to be examined under a microscope if any abnormalities are found.  The exam is finished when the entire colon has been viewed. AFTER THE PROCEDURE   Do not drive for 24 hours after the exam.  You may have a small amount of blood in your stool.  You may pass moderate amounts of gas and have mild abdominal cramping or bloating. This is caused by the gas used to inflate your colon during the exam.  Ask when your test results will be ready and how you will get your results. Make sure you get your test results.   This information is not intended to replace advice given to you by your health care provider. Make sure you discuss any questions you have with your health care provider.   Document Released: 06/19/2000 Document Revised: 04/12/2013 Document Reviewed: 02/27/2013 Elsevier Interactive Patient Education 2016 Elsevier Inc.  

## 2015-12-05 NOTE — Progress Notes (Signed)
Patient ID: DEASIAH MURAMOTO, female   DOB: 12-Apr-1956, 60 y.o.   MRN: [id number]  Chief Complaint  Patient presents with  . Follow-up    HPI AHNI GRUNKE is a 60 y.o. female here today to discuss her ct scan done on 11/28/15. Patient 's brother has rectal cancer.  HPI  Past Medical History  Diagnosis Date  . Hypertension     Past Surgical History  Procedure Laterality Date  . Knee surgery Bilateral 2007  . Neck surgery    . Tonsillectomy    . Lipoma excision  1999    of the left side of the neck  . Appendectomy    . Salpingoophorectomy Bilateral   . Tubal ligation    . Abdominal hysterectomy  2014    Dr. Sabra Heck  . Hernia repair  2014    Dr. Pat Patrick    Family History  Problem Relation Age of Onset  . Stroke Mother   . Alzheimer's disease Father   . Heart disease Father   . Hypothyroidism Sister   . Hypertension Brother   . Polycystic kidney disease Brother   . Depression Sister   . Hypertension Sister   . Anxiety disorder Sister   . Rectal cancer Brother     Social History Social History  Substance Use Topics  . Smoking status: Current Every Day Smoker -- 1.00 packs/day for 45 years    Types: Cigarettes  . Smokeless tobacco: None  . Alcohol Use: No    Allergies  Allergen Reactions  . Ceclor  [Cefaclor] Hives  . Demerol  [Meperidine]   . Erythromycin   . Flagyl  [Metronidazole]   . Hydrochlorothiazide     severe cramping in her legs/knee/calves  . Hydrocodone-Acetaminophen   . Penicillin G Hives  . Penicillins   . Sulfa Antibiotics Swelling    Current Outpatient Prescriptions  Medication Sig Dispense Refill  . ibuprofen (ADVIL,MOTRIN) 400 MG tablet Take by mouth.    Marland Kitchen lisinopril (PRINIVIL,ZESTRIL) 40 MG tablet TAKE 1 TABLET BY MOUTH DAILY 30 tablet 12  . LORazepam (ATIVAN) 0.5 MG tablet Take 1 tablet (0.5 mg total) by mouth at bedtime. 30 tablet 5  . metoprolol succinate (TOPROL-XL) 25 MG 24 hr tablet TAKE 1 TABLET BY MOUTH ONCE DAILY 30 tablet 5   . venlafaxine (EFFEXOR) 75 MG tablet TAKE 1 TABLET BY MOUTH DAILY 30 tablet 5   No current facility-administered medications for this visit.    Review of Systems Review of Systems  Constitutional: Negative.   Respiratory: Negative.   Cardiovascular: Negative.     Blood pressure 132/74, pulse 78, resp. rate 14, height 5\' 1"  (1.549 m), weight 232 lb (105.235 kg).  Physical Exam Physical Exam  Constitutional: She is oriented to person, place, and time. She appears well-developed and well-nourished.  Eyes: Conjunctivae are normal. No scleral icterus.  Neck: Neck supple.  Cardiovascular: Normal rate, regular rhythm and normal heart sounds.   Pulmonary/Chest: Effort normal and breath sounds normal.  Lymphadenopathy:    She has no cervical adenopathy.  Neurological: She is alert and oriented to person, place, and time.  Skin: Skin is warm and dry.    Data Reviewed CT of the chest and abdomen reviewed. Lipoma in the left supraclavicular fossa poorly imaged. Solitary renal cyst. No evidence of recurrent GYN pathology.  Assessment    Early satiety, unrelated to any intra-abdominal process by CT imaging.  Stable left supraclavicular lipoma, asymptomatic.    Plan    The patient's  older brother was recently diagnosed with stage I, T1 carcinoma of the rectum. She's been encouraged to consider colonoscopy. Due to her upper GI symptoms, EGD would be completed at the same time.    Colonoscopy and upper  with possible biopsy/polypectomy prn: Information regarding the procedure, including its potential risks and complications (including but not limited to perforation of the bowel, which may require emergency surgery to repair, and bleeding) was verbally given to the patient. Educational information regarding lower intestinal endoscopy was given to the patient. Written instructions for how to complete the bowel prep using Miralax were provided. The importance of drinking ample fluids to avoid  dehydration as a result of the prep emphasized.  Patient to contact the office when she would like to arrange an upper and lower endoscopy. This patient was given prep instructions today. Miralax prescription will be sent in once date arranged.   This patient will not require a pre-op visit.   PCP:  Cranford Mon, Richard L This information has been scribed by Gaspar Cola CMA.     Robert Bellow 12/06/2015, 10:39 AM

## 2015-12-06 DIAGNOSIS — Z1211 Encounter for screening for malignant neoplasm of colon: Secondary | ICD-10-CM | POA: Insufficient documentation

## 2015-12-06 DIAGNOSIS — Z0000 Encounter for general adult medical examination without abnormal findings: Secondary | ICD-10-CM | POA: Insufficient documentation

## 2015-12-10 ENCOUNTER — Other Ambulatory Visit: Payer: Self-pay | Admitting: Family Medicine

## 2016-03-10 ENCOUNTER — Other Ambulatory Visit: Payer: Self-pay | Admitting: Family Medicine

## 2016-03-10 NOTE — Telephone Encounter (Signed)
LOV 11/18/2015. Has appointment 05/18/2016. Renaldo Fiddler, CMA

## 2016-05-18 ENCOUNTER — Ambulatory Visit (INDEPENDENT_AMBULATORY_CARE_PROVIDER_SITE_OTHER): Payer: 59 | Admitting: Family Medicine

## 2016-05-18 ENCOUNTER — Encounter: Payer: Self-pay | Admitting: Family Medicine

## 2016-05-18 VITALS — BP 118/82 | Temp 98.2°F | Resp 16 | Wt 232.0 lb

## 2016-05-18 DIAGNOSIS — Z720 Tobacco use: Secondary | ICD-10-CM

## 2016-05-18 DIAGNOSIS — E785 Hyperlipidemia, unspecified: Secondary | ICD-10-CM | POA: Diagnosis not present

## 2016-05-18 DIAGNOSIS — L659 Nonscarring hair loss, unspecified: Secondary | ICD-10-CM | POA: Diagnosis not present

## 2016-05-18 DIAGNOSIS — R739 Hyperglycemia, unspecified: Secondary | ICD-10-CM

## 2016-05-18 DIAGNOSIS — I10 Essential (primary) hypertension: Secondary | ICD-10-CM | POA: Diagnosis not present

## 2016-05-18 DIAGNOSIS — F419 Anxiety disorder, unspecified: Secondary | ICD-10-CM | POA: Diagnosis not present

## 2016-05-18 NOTE — Progress Notes (Signed)
Patient: Ruth Higgins Female    DOB: 1955/08/19   60 y.o.   MRN: [id number] Visit Date: 05/18/2016  Today's Provider: [name], MD   Chief Complaint  Patient presents with  . Hypertension  . Depression   Subjective:    HPI  Hypertension, follow-up:  BP Readings from Last 3 Encounters:  05/18/16 118/82  12/05/15 132/74  11/21/15 132/80    She was last seen for hypertension 6 months ago.  BP at that visit was 132/74. Management since that visit includes no changes. She reports good compliance with treatment. She is not having side effects.  She is not exercising. She is adherent to low salt diet.   Outside blood pressures are checked occasionally. She is experiencing none.  Patient denies fatigue.   Cardiovascular risk factors include dyslipidemia and hypertension.   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 I stressed to her that I would not refill her medications that she did not have this done. Once this is done I will refill everything that is not controlled for 1 year.      Allergies  Allergen Reactions  . Ceclor  [Cefaclor] Hives  . Demerol  [Meperidine]   . Erythromycin   . Flagyl  [Metronidazole]   . Hydrochlorothiazide     severe cramping in her legs/knee/calves  . Hydrocodone-Acetaminophen   . Penicillin G Hives  . Penicillins   . Sulfa Antibiotics Swelling     Current Outpatient Prescriptions:  .  ibuprofen (ADVIL,MOTRIN) 400 MG tablet, Take by mouth., Disp: , Rfl:  .  lisinopril (PRINIVIL,ZESTRIL) 40 MG tablet, TAKE 1 TABLET BY MOUTH DAILY, Disp: 30 tablet, Rfl: 12 .  LORazepam (ATIVAN) 0.5 MG tablet, TAKE 1 TABLET BY MOUTH EVERY AT BEDTIME, Disp: 30 tablet, Rfl: 5 .  metoprolol  succinate (TOPROL-XL) 25 MG 24 hr tablet, TAKE 1 TABLET BY MOUTH ONCE DAILY, Disp: 30 tablet, Rfl: 5 .  venlafaxine (EFFEXOR) 75 MG tablet, TAKE 1 TABLET BY MOUTH DAILY, Disp: 30 tablet, Rfl: 5  Review of Systems  Constitutional: Negative.   Eyes: Negative.   Respiratory: Negative.   Cardiovascular: Negative.   Endocrine: Negative.   Allergic/Immunologic: Negative.   Hematological: Negative.   Psychiatric/Behavioral: Negative.     Social History  Substance Use Topics  . Smoking status: Current Every Day Smoker    Packs/day: 1.00    Years: 45.00    Types: Cigarettes  . Smokeless tobacco: Not on file  . Alcohol use No   Objective:   BP 118/82 (BP Location: Left Arm, Patient Position: Sitting, Cuff Size: Large)   Temp 98.2 F (36.8 C)   Resp 16   Wt 232 lb (105.2 kg)   BMI 43.84 kg/m   Physical Exam  Constitutional: She is oriented to person, place, and time. She appears well-developed and well-nourished.  HENT:  Head: Normocephalic and atraumatic.  Eyes: Conjunctivae are normal. No scleral icterus.  Neck: No thyromegaly present.  Cardiovascular: Normal rate, regular rhythm and normal heart sounds.   Pulmonary/Chest: Effort normal and breath sounds normal.  Abdominal: Soft.  Neurological: She is alert and oriented to person, place, and time.  Skin: Skin is warm and dry.  Psychiatric: She has a normal mood and affect. Her behavior is normal. Judgment and thought content normal.        Assessment & Plan:     1. Essential (primary) hypertension  - Comprehensive metabolic panel  2. Anxiety, mild  - TSH  3. Current tobacco use   4. Hyperglycemia/Prediabetes 8 for dietary changes stress to patient. - Hemoglobin A1c  5. Hyperlipidemia, unspecified hyperlipidemia type  - Lipid panel  6. Hair loss  - TSH - VITAMIN D 25 Hydroxy (Vit-D Deficiency, Fractures) 7.Obesity 8. Multiple drug allergies 9. Osteoarthritis    I have done the exam and reviewed the  above chart and it is accurate to the best of my knowledge. Development worker, community has been used in this note in any air is in the dictation or transcription are unintentional.   [name], MD  [place]

## 2016-05-18 NOTE — Patient Instructions (Signed)
To wean off Effexor, take every other day all through November. Then, every 3rd day in December until Christmas.

## 2016-05-19 ENCOUNTER — Other Ambulatory Visit
Admission: RE | Admit: 2016-05-19 | Discharge: 2016-05-19 | Disposition: A | Discharge status: Home / Self Care | Payer: 59 | Source: Ambulatory Visit | Attending: Family Medicine | Admitting: Family Medicine

## 2016-05-19 DIAGNOSIS — F419 Anxiety disorder, unspecified: Secondary | ICD-10-CM | POA: Diagnosis not present

## 2016-05-19 DIAGNOSIS — E785 Hyperlipidemia, unspecified: Secondary | ICD-10-CM | POA: Diagnosis not present

## 2016-05-19 DIAGNOSIS — I10 Essential (primary) hypertension: Secondary | ICD-10-CM | POA: Insufficient documentation

## 2016-05-19 DIAGNOSIS — R739 Hyperglycemia, unspecified: Secondary | ICD-10-CM | POA: Diagnosis not present

## 2016-05-19 DIAGNOSIS — L659 Nonscarring hair loss, unspecified: Secondary | ICD-10-CM | POA: Insufficient documentation

## 2016-05-19 LAB — COMPREHENSIVE METABOLIC PANEL
ALBUMIN: 4 g/dL (ref 3.5–5.0)
ALT: 27 U/L (ref 14–54)
AST: 21 U/L (ref 15–41)
Alkaline Phosphatase: 74 U/L (ref 38–126)
Anion gap: 7 (ref 5–15)
BUN: 14 mg/dL (ref 6–20)
CHLORIDE: 107 mmol/L (ref 101–111)
CO2: 26 mmol/L (ref 22–32)
Calcium: 8.9 mg/dL (ref 8.9–10.3)
Creatinine, Ser: 0.94 mg/dL (ref 0.44–1.00)
GFR calc Af Amer: >60 mL/min (ref >60)
GLUCOSE: 139 mg/dL — AB (ref 65–99)
POTASSIUM: 3.8 mmol/L (ref 3.5–5.1)
SODIUM: 140 mmol/L (ref 135–145)
Total Bilirubin: 0.8 mg/dL (ref 0.3–1.2)
Total Protein: 7.1 g/dL (ref 6.5–8.1)

## 2016-05-19 LAB — LIPID PANEL
Cholesterol: 221 mg/dL — ABNORMAL HIGH (ref 0–200)
HDL: 28 mg/dL — ABNORMAL LOW (ref >40)
LDL CALC: 139 mg/dL — AB (ref 0–99)
Total CHOL/HDL Ratio: 7.9 RATIO
Triglycerides: 269 mg/dL — ABNORMAL HIGH (ref <150)
VLDL: 54 mg/dL — AB (ref 0–40)

## 2016-05-19 LAB — TSH: TSH: 2.909 u[IU]/mL (ref 0.350–4.500)

## 2016-05-20 ENCOUNTER — Telehealth: Payer: Self-pay

## 2016-05-20 DIAGNOSIS — R7303 Prediabetes: Secondary | ICD-10-CM

## 2016-05-20 LAB — HEMOGLOBIN A1C
Hgb A1c MFr Bld: 6.1 % — ABNORMAL HIGH (ref 4.8–5.6)
MEAN PLASMA GLUCOSE: 128 mg/dL

## 2016-05-20 LAB — VITAMIN D 25 HYDROXY (VIT D DEFICIENCY, FRACTURES): VIT D 25 HYDROXY: 8.8 ng/mL — AB (ref 30.0–100.0)

## 2016-05-20 MED ORDER — VITAMIN D (ERGOCALCIFEROL) 1.25 MG (50000 UNIT) PO CAPS: 50000.0000 [IU] | ORAL_CAPSULE | ORAL | 11 refills | Status: DC

## 2016-05-20 NOTE — Telephone Encounter (Signed)
-----   Message from Jerrol Banana., MD sent at 05/20/2016  8:24 AM EST ----- If this blood work is fasting patient is now diabetic. My recommendation is referral to lifestyle Center. Vitamin D is also low so would start 50,000 units weekly. Recheck 3 months for repeat A1c

## 2016-05-20 NOTE — Telephone Encounter (Signed)
Patient has been advised. KW 

## 2016-06-04 ENCOUNTER — Encounter: Payer: Self-pay | Admitting: Family Medicine

## 2016-06-04 ENCOUNTER — Ambulatory Visit (INDEPENDENT_AMBULATORY_CARE_PROVIDER_SITE_OTHER): Payer: 59 | Admitting: Family Medicine

## 2016-06-04 ENCOUNTER — Other Ambulatory Visit: Payer: Self-pay | Admitting: Family Medicine

## 2016-06-04 VITALS — BP 124/78 | HR 78 | Temp 97.8°F | Resp 16 | Wt 240.0 lb

## 2016-06-04 DIAGNOSIS — B9789 Other viral agents as the cause of diseases classified elsewhere: Secondary | ICD-10-CM | POA: Diagnosis not present

## 2016-06-04 DIAGNOSIS — J069 Acute upper respiratory infection, unspecified: Secondary | ICD-10-CM

## 2016-06-04 DIAGNOSIS — M7989 Other specified soft tissue disorders: Secondary | ICD-10-CM | POA: Diagnosis not present

## 2016-06-04 MED ORDER — FUROSEMIDE 20 MG PO TABS: 20.0000 mg | ORAL_TABLET | Freq: Every day | ORAL | 0 refills | Status: DC

## 2016-06-04 MED ORDER — HYDROCOD POLST-CPM POLST ER 10-8 MG/5ML PO SUER: 5.0000 mL | Freq: Two times a day (BID) | ORAL | 0 refills | Status: DC | PRN

## 2016-06-04 NOTE — Patient Instructions (Signed)
Let me know if your sinus congestion is not improving.

## 2016-06-04 NOTE — Progress Notes (Signed)
Subjective:     Patient ID: Ruth Higgins, female   DOB: March 02, 1956, 60 y.o.   MRN: [id number]  HPI  Chief Complaint  Patient presents with  . URI    x 2 weeks. C/O cough (mostly dry, worse at night), voice changes, nasal congestion. Afebrile. Pt also repors her ears were painful after flying to [place] and back. Pt also c/o LLE edema.   States she has persistent sinus pressure with clear PND and accompanying cough. Has used Tussionex in the past with improvement. States she has significant left knee arthritis and her left lower leg became more swollen during her recent trip. Wishes Lasix to help reduce swelling.    Review of Systems     Objective:   Physical Exam  Constitutional: She appears well-developed and well-nourished. No distress.  Musculoskeletal: Edema: no pitting edema of left lower extremity   Ears: T.M's intact without inflammation Throat: no tonsillar enlargement or exudate Neck: no cervical adenopathy Lungs: clear     Assessment:    1. Viral upper respiratory tract infection - chlorpheniramine-HYDROcodone (TUSSIONEX PENNKINETIC ER) 10-8 MG/5ML SUER; Take 5 mLs by mouth every 12 (twelve) hours as needed for cough.  Dispense: 120 mL; Refill: 0  2. Left leg swelling - furosemide (LASIX) 20 MG tablet; Take 1 tablet (20 mg total) by mouth daily. As needed for leg swelling Dispense: 7 tablet; Refill: 0    Plan:    Further f/u if not improving

## 2016-06-11 ENCOUNTER — Encounter: Payer: Self-pay | Admitting: Dietician

## 2016-06-11 ENCOUNTER — Encounter: Payer: 59 | Attending: Family Medicine | Admitting: Dietician

## 2016-06-11 VITALS — Ht 61.0 in | Wt 237.3 lb

## 2016-06-11 DIAGNOSIS — Z713 Dietary counseling and surveillance: Secondary | ICD-10-CM | POA: Diagnosis not present

## 2016-06-11 DIAGNOSIS — R7303 Prediabetes: Secondary | ICD-10-CM | POA: Diagnosis not present

## 2016-06-11 DIAGNOSIS — IMO0001 Reserved for inherently not codable concepts without codable children: Secondary | ICD-10-CM

## 2016-06-11 NOTE — Progress Notes (Signed)
Medical Nutrition Therapy: Visit start time: 1330  end time: 1430  Assessment:  Diagnosis: prediabers Past medical history: HTN, hysterectomy and cyst removal (30lb per patient), degenerative arthritis in knees Psychosocial issues/ stress concerns: sleep issues, has stopped venlafaxine Preferred learning method:  . Hands-on  Current weight: 237.3lbs with shoes  Height: 5 1  Medications, supplements: reviewed list in chart with patient  Progress and evaluation: Patient reports weight gain over past several years, despite working to control caloric intake. Has recently reduced amount of salad dressing used; avoids adding salt. Reports abdominal bloating even when eating very little during the day. Works Fri-Sunday nights, 6:30pm-7am. Some additional fill-in hours. Mother lives in home, some issue is making sure mother has a meal without food waste, so often gets takeout food, occasionally rotisserie chicken from grocery. Patient's main goal for MNT is weight loss to improve blood pressure, blood sugar, and knee pain. She feels that recently elevated BG might have been due to increased sugar intake and decreased physical activity for the 2 weeks prior to testing.    Physical activity: yoga once a week for 1 hour. Exercise options limited due to knees Sleep: sleeps about 8am - 1pm usually interrupted. Sleeps during the night on weekdays, but sleep is also interrupted several times a night.   Dietary Intake:  Usual eating pattern includes 1-2 meals and 0-1 snacks per day.  Dining out frequency: 10 meals per week.  Breakfast: 7-8am ham biscuit 2 times a week, otherwise skips. Drinks regular coffee with 1/2 and 1/2 and 3tsp. sugar, then sleeps   Snack: 1pm sips tea after sleeping Lunch: mother might cook a crock pot meal. occ hot dog with chili, slaw, mustard; sub at [employer], salad with grilled chicken taco salad, sandwich at [employer]. Leftovers, sometimes potatoes Snack: none; sometimes lite  meal at 5pm such as potatoes, soup and crackers Supper: 8-9pm shares cooked meal with coworker, such as spaghetti, chicken casserole, little bread.   Snack: none Beverages: averaging 1-2 glasses sweet tea. No sodas, does not like artificial sweeteners, low water intake  Nutrition Care Education: Topics covered: weight management, blood sugar control, hypertension Basic nutrition: basic food groups, appropriate nutrient balance, appropriate meal and snack schedule, general nutrition guidelines    Weight control: benefits of weight control, calorie needs for weight loss calculated at 1300kcal daily. Provided guidance on protein, carbohydrate and fat needs, importance of making low fat and low sugar food choices and limiting added fats and sugars; discussed meal options; discussed benefits of tracking food intake, particularly since there is no regular eating pattern at this point.  Blood sugar control: appropriate meal and snack schedule, appropriate carb intake and balance Hypertension:  importance of controlling BP, identifying high sodium foods, identifying food sources of, potassium, magnesium   Nutritional Diagnosis:  [place]-2.2 Altered nutrition-related laboratory As related to hyperglycemia.  As evidenced by MD diagnosis, patient report. [place]-3.3 Overweight/obesity As related to erratic eating pattern, excess calories, inadequate sleep, inactivity.  As evidenced by patient report.  Intervention: Patient often will have nothing to eat between 8:30 pm when she has break at work, to 3-5pm the next day.    Eating schedule is very sporadic; advised keeping food diary to increase eating awareness and help establish more structured eating.   Instruction as noted above.   Set goals with patient direction.   Education Materials given:  . Food lists/ Planning A Balanced Meal . Sample meal pattern/ menus . Snacking handout . Goals/ instructions  Learner/ who was taught:  . Patient   Level of  understanding: Marland Kitchen Verbalizes/ demonstrates competency  Demonstrated degree of understanding via:   Teach back Learning barriers: . None  Willingness to learn/ readiness for change: . Eager, change in progress  Monitoring and Evaluation:  Dietary intake, exercise, blood pressure and blood sugar control, and body weight      follow up: 07/16/16

## 2016-06-11 NOTE — Patient Instructions (Signed)
   Plan to eat at least a small something every 3-5 hours during the time you are awake.   Keep a diary of what and how much you eat. Record everything, including drinks and "nibbles".   Reduce portions of salad dressings and mayonnaise.   Work on strategies to improve sleep -- try decaf coffee especially before going to sleep or drink water before going to sleep and coffee when you wake up.

## 2016-07-16 ENCOUNTER — Ambulatory Visit: Payer: 59 | Admitting: Dietician

## 2016-07-24 ENCOUNTER — Other Ambulatory Visit: Payer: Self-pay | Admitting: Family Medicine

## 2016-08-07 ENCOUNTER — Encounter: Payer: Self-pay | Admitting: Dietician

## 2016-08-07 NOTE — Progress Notes (Signed)
Patient cancelled her follow-up appointment for 07/16/16, and has not yet rescheduled. Sent discharge letter to MD.

## 2016-08-13 DIAGNOSIS — M84375A Stress fracture, left foot, initial encounter for fracture: Secondary | ICD-10-CM | POA: Diagnosis not present

## 2016-08-13 DIAGNOSIS — M79672 Pain in left foot: Secondary | ICD-10-CM | POA: Diagnosis not present

## 2016-08-18 ENCOUNTER — Other Ambulatory Visit: Payer: Self-pay | Admitting: Family Medicine

## 2016-09-03 DIAGNOSIS — M79672 Pain in left foot: Secondary | ICD-10-CM | POA: Diagnosis not present

## 2016-09-03 DIAGNOSIS — M84375D Stress fracture, left foot, subsequent encounter for fracture with routine healing: Secondary | ICD-10-CM | POA: Diagnosis not present

## 2016-09-07 DIAGNOSIS — H5213 Myopia, bilateral: Secondary | ICD-10-CM | POA: Diagnosis not present

## 2016-10-01 DIAGNOSIS — M84375D Stress fracture, left foot, subsequent encounter for fracture with routine healing: Secondary | ICD-10-CM | POA: Diagnosis not present

## 2016-10-01 DIAGNOSIS — M79672 Pain in left foot: Secondary | ICD-10-CM | POA: Diagnosis not present

## 2016-11-17 ENCOUNTER — Ambulatory Visit (INDEPENDENT_AMBULATORY_CARE_PROVIDER_SITE_OTHER): Payer: 59 | Admitting: Family Medicine

## 2016-11-17 ENCOUNTER — Encounter: Payer: Self-pay | Admitting: Family Medicine

## 2016-11-17 VITALS — BP 136/84 | HR 78 | Temp 97.9°F | Resp 16 | Wt 237.0 lb

## 2016-11-17 DIAGNOSIS — Z720 Tobacco use: Secondary | ICD-10-CM

## 2016-11-17 DIAGNOSIS — H01005 Unspecified blepharitis left lower eyelid: Secondary | ICD-10-CM | POA: Diagnosis not present

## 2016-11-17 DIAGNOSIS — R7303 Prediabetes: Secondary | ICD-10-CM | POA: Diagnosis not present

## 2016-11-17 DIAGNOSIS — H01002 Unspecified blepharitis right lower eyelid: Secondary | ICD-10-CM | POA: Diagnosis not present

## 2016-11-17 DIAGNOSIS — I10 Essential (primary) hypertension: Secondary | ICD-10-CM

## 2016-11-17 DIAGNOSIS — G4700 Insomnia, unspecified: Secondary | ICD-10-CM

## 2016-11-17 DIAGNOSIS — J309 Allergic rhinitis, unspecified: Secondary | ICD-10-CM | POA: Diagnosis not present

## 2016-11-17 DIAGNOSIS — E785 Hyperlipidemia, unspecified: Secondary | ICD-10-CM | POA: Diagnosis not present

## 2016-11-17 MED ORDER — LORAZEPAM 0.5 MG PO TABS: 0.5000 mg | ORAL_TABLET | Freq: Three times a day (TID) | ORAL | 3 refills | Status: DC | PRN

## 2016-11-17 MED ORDER — LORATADINE 10 MG PO TABS: 10.0000 mg | ORAL_TABLET | Freq: Every day | ORAL | 11 refills | Status: DC

## 2016-11-17 MED ORDER — MONTELUKAST SODIUM 10 MG PO TABS: 10.0000 mg | ORAL_TABLET | Freq: Every day | ORAL | 3 refills | Status: DC

## 2016-11-17 NOTE — Progress Notes (Signed)
Patient: Ruth Higgins Female    DOB: 09/16/1955   61 y.o.   MRN: [id number] Visit Date: 11/17/2016  Today's Provider: [name], MD   Chief Complaint  Patient presents with  . Hypertension  . Diabetes   Subjective:    HPI  Hypertension, follow-up:  BP Readings from Last 3 Encounters:  11/17/16 136/84  06/04/16 124/78  05/18/16 118/82    She was last seen for hypertension 6 months ago.  BP at that visit was 124/78. Management since that visit includes no changes. She reports good compliance with treatment. She is not having side effects.  She is not exercising. She is adherent to low salt diet.   Outside blood pressures are checked occasionally. She is experiencing none.  Patient denies exertional chest pressure/discomfort and palpitations.   Cardiovascular risk factors include diabetes mellitus.      Weight trend: stable Wt Readings from Last 3 Encounters:  11/17/16 237 lb (107.5 kg)  06/11/16 237 lb 4.8 oz (107.6 kg)  06/04/16 240 lb (108.9 kg)    Current diet: well balanced    Diabetes Mellitus Type II, Follow-up:   Lab Results  Component Value Date   HGBA1C 6.1 (H) 05/19/2016    Last seen for diabetes 6 months ago.  Management since then includes referred to [facility] for new onset diabetes. She reports good compliance with treatment. She is not having side effects.  Current symptoms include none and have been stable. Home blood sugar records: not being checked  Episodes of hypoglycemia? no   Current Insulin Regimen: none Most Recent Eye Exam: 10/2016.  Weight trend: stable Prior visit with dietician: yes - in 06/2016. Current diet: well balanced Current exercise: walking at work, but no regular exercise.   Pertinent Labs:    Component Value Date/Time   CHOL 221 (H) 05/19/2016 0845   TRIG 269 (H) 05/19/2016 0845   HDL 28 (L) 05/19/2016 0845   LDLCALC 139 (H) 05/19/2016 0845   CREATININE 0.94 05/19/2016 0845     CREATININE 0.96 02/25/2013 1746    Wt Readings from Last 3 Encounters:  11/17/16 237 lb (107.5 kg)  06/11/16 237 lb 4.8 oz (107.6 kg)  06/04/16 240 lb (108.9 kg)           Allergies  Allergen Reactions  . Ceclor  [Cefaclor] Hives  . Demerol  [Meperidine]   . Erythromycin   . Flagyl  [Metronidazole]   . Hydrochlorothiazide     severe cramping in her legs/knee/calves  . Hydrocodone-Acetaminophen   . Penicillin G Hives  . Penicillins   . Sulfa Antibiotics Swelling     Current Outpatient Prescriptions:  .  ibuprofen (ADVIL,MOTRIN) 400 MG tablet, Take by mouth., Disp: , Rfl:  .  lisinopril (PRINIVIL,ZESTRIL) 40 MG tablet, TAKE 1 TABLET BY MOUTH DAILY, Disp: 30 tablet, Rfl: 12 .  LORazepam (ATIVAN) 0.5 MG tablet, TAKE 1 TABLET BY MOUTH EVERY AT BEDTIME, Disp: 30 tablet, Rfl: 5 .  metoprolol succinate (TOPROL-XL) 25 MG 24 hr tablet, TAKE 1 TABLET BY MOUTH ONCE DAILY, Disp: 90 tablet, Rfl: 3 .  Vitamin D, Ergocalciferol, (DRISDOL) 50000 units CAPS capsule, Take 1 capsule (50,000 Units total) by mouth every 7 (seven) days., Disp: 4 capsule, Rfl: 11 .  chlorpheniramine-HYDROcodone (TUSSIONEX PENNKINETIC ER) 10-8 MG/5ML SUER, Take 5 mLs by mouth every 12 (twelve) hours as needed for cough. (Patient not taking: Reported on 11/17/2016), Disp: 120 mL, Rfl: 0 .  furosemide (LASIX) 20  MG tablet, Take 1 tablet (20 mg total) by mouth daily. As needed for leg swellin (Patient not taking: Reported on 06/11/2016), Disp: 7 tablet, Rfl: 0  Review of Systems  Constitutional: Negative.   Eyes: Positive for pain, redness and itching.  Respiratory: Negative.   Cardiovascular: Negative for chest pain, palpitations and leg swelling.  Musculoskeletal: Positive for arthralgias.  Neurological: Negative for headaches.    Social History  Substance Use Topics  . Smoking status: Current Every Day Smoker    Packs/day: 1.00    Years: 45.00    Types: Cigarettes  . Smokeless tobacco: Never Used      Comment: smokes due to stress; has quit several times  . Alcohol use No   Objective:   BP 136/84 (BP Location: Right Arm, Patient Position: Sitting, Cuff Size: Normal)   Pulse 78   Temp 97.9 F (36.6 C)   Resp 16   Wt 237 lb (107.5 kg)   SpO2 96%   BMI 44.78 kg/m  Vitals:   11/17/16 1611  BP: 136/84  Pulse: 78  Resp: 16  Temp: 97.9 F (36.6 C)  SpO2: 96%  Weight: 237 lb (107.5 kg)     Physical Exam  Constitutional: She is oriented to person, place, and time. She appears well-developed and well-nourished.  HENT:  Head: Normocephalic and atraumatic.  Right Ear: External ear normal.  Left Ear: External ear normal.  Nose: Nose normal.  Eyes: Conjunctivae are normal. Pupils are equal, round, and reactive to light.  Cardiovascular: Normal rate, regular rhythm and normal heart sounds.   Pulmonary/Chest: Effort normal and breath sounds normal.  Abdominal: Soft.  Musculoskeletal: Normal range of motion.  Neurological: She is alert and oriented to person, place, and time.  Skin: Skin is warm and dry.  Psychiatric: She has a normal mood and affect. Her behavior is normal. Judgment and thought content normal.        Assessment & Plan:     1. Essential (primary) hypertension Stable. Continue current medications.  - Comprehensive metabolic panel  2. Hyperlipidemia, unspecified hyperlipidemia type F/U pending report. Consider starting on STATIN.  - Lipid panel  3. Pre-diabetes - Hemoglobin A1c  4. Blepharitis of lower eyelids of both eyes, unspecified type Patient will F/U with Ophthalmology   5. Allergic rhinitis, unspecified seasonality, unspecified trigger - montelukast (SINGULAIR) 10 MG tablet; Take 1 tablet (10 mg total) by mouth at bedtime.  Dispense: 30 tablet; Refill: 3 - loratadine (CLARITIN) 10 MG tablet; Take 1 tablet (10 mg total) by mouth daily.  Dispense: 30 tablet; Refill: 11  6. Current tobacco use Encouraged to stop smoking.   7. Insomnia,  unspecified type - LORazepam (ATIVAN) 0.5 MG tablet; Take 1 tablet (0.5 mg total) by mouth every 8 (eight) hours as needed for anxiety.  Dispense: 30 tablet; Refill: 3       I have done the exam and reviewed the above chart and it is accurate to the best of my knowledge. Development worker, community has been used in this note in any air is in the dictation or transcription are unintentional.  [name], MD  [place]

## 2016-11-23 ENCOUNTER — Telehealth: Payer: Self-pay | Admitting: *Deleted

## 2016-11-23 NOTE — Telephone Encounter (Signed)
Received referral for low dose lung cancer screening CT scan. Message left at phone number listed in EMR for patient to call me back to facilitate scheduling scan.  

## 2016-11-24 ENCOUNTER — Telehealth: Payer: Self-pay | Admitting: *Deleted

## 2016-11-24 DIAGNOSIS — Z87891 Personal history of nicotine dependence: Secondary | ICD-10-CM

## 2016-11-24 NOTE — Telephone Encounter (Signed)
Received referral for initial lung cancer screening scan. Contacted patient and obtained smoking history,(current, 47 pack year) as well as answering questions related to screening process. Patient denies signs of lung cancer such as weight loss or hemoptysis. Patient denies comorbidity that would prevent curative treatment if lung cancer were found. Patient is scheduled for shared decision making visit and CT scan on 12/01/16.

## 2016-12-01 ENCOUNTER — Encounter: Payer: Self-pay | Admitting: Oncology

## 2016-12-01 ENCOUNTER — Inpatient Hospital Stay: Payer: 59 | Attending: Oncology | Admitting: Oncology

## 2016-12-01 ENCOUNTER — Ambulatory Visit
Admission: RE | Admit: 2016-12-01 | Discharge: 2016-12-01 | Disposition: A | Discharge status: Home / Self Care | Payer: 59 | Source: Ambulatory Visit | Attending: Oncology | Admitting: Oncology

## 2016-12-01 DIAGNOSIS — Z87891 Personal history of nicotine dependence: Secondary | ICD-10-CM | POA: Diagnosis not present

## 2016-12-01 DIAGNOSIS — F17210 Nicotine dependence, cigarettes, uncomplicated: Secondary | ICD-10-CM | POA: Diagnosis not present

## 2016-12-01 DIAGNOSIS — D1722 Benign lipomatous neoplasm of skin and subcutaneous tissue of left arm: Secondary | ICD-10-CM | POA: Diagnosis not present

## 2016-12-01 DIAGNOSIS — Z122 Encounter for screening for malignant neoplasm of respiratory organs: Secondary | ICD-10-CM | POA: Insufficient documentation

## 2016-12-01 NOTE — Progress Notes (Signed)
In accordance with CMS guidelines, patient has met eligibility criteria including age, absence of signs or symptoms of lung cancer.  Social History  Substance Use Topics  . Smoking status: Current Every Day Smoker    Packs/day: 1.00    Years: 47.00    Types: Cigarettes  . Smokeless tobacco: Never Used     Comment: smokes due to stress; has quit several times  . Alcohol use No     A shared decision-making session was conducted prior to the performance of CT scan. This includes one or more decision aids, includes benefits and harms of screening, follow-up diagnostic testing, over-diagnosis, false positive rate, and total radiation exposure.  Counseling on the importance of adherence to annual lung cancer LDCT screening, impact of co-morbidities, and ability or willingness to undergo diagnosis and treatment is imperative for compliance of the program.  Counseling on the importance of continued smoking cessation for former smokers; the importance of smoking cessation for current smokers, and information about tobacco cessation interventions have been given to patient including [place] and 1800 quit [place] programs.  Written order for lung cancer screening with LDCT has been given to the patient and any and all questions have been answered to the best of my abilities.   Yearly follow up will be coordinated by Burgess Estelle, Thoracic Navigator.

## 2016-12-03 ENCOUNTER — Telehealth: Payer: Self-pay | Admitting: *Deleted

## 2016-12-03 DIAGNOSIS — M84375D Stress fracture, left foot, subsequent encounter for fracture with routine healing: Secondary | ICD-10-CM | POA: Diagnosis not present

## 2016-12-03 DIAGNOSIS — M79672 Pain in left foot: Secondary | ICD-10-CM | POA: Diagnosis not present

## 2016-12-03 NOTE — Telephone Encounter (Signed)
Notified patient of LDCT lung cancer screening program results with recommendation for 12 month follow up imaging. Also notified of incidental findings noted below and is encouraged to discuss further with PCP who will receive a copy of this note and/or the CT report. Patient verbalizes understanding.   IMPRESSION: Lung-RADS 2, benign appearance or behavior. Continue annual screening with low-dose chest CT without contrast in 12 months.  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

## 2016-12-22 ENCOUNTER — Other Ambulatory Visit
Admission: RE | Admit: 2016-12-22 | Discharge: 2016-12-22 | Disposition: A | Discharge status: Home / Self Care | Payer: 59 | Source: Ambulatory Visit | Attending: Family Medicine | Admitting: Family Medicine

## 2016-12-22 DIAGNOSIS — E785 Hyperlipidemia, unspecified: Secondary | ICD-10-CM | POA: Diagnosis not present

## 2016-12-22 DIAGNOSIS — R7303 Prediabetes: Secondary | ICD-10-CM | POA: Diagnosis not present

## 2016-12-22 DIAGNOSIS — I10 Essential (primary) hypertension: Secondary | ICD-10-CM | POA: Diagnosis not present

## 2016-12-22 LAB — COMPREHENSIVE METABOLIC PANEL
ALT: 20 U/L (ref 14–54)
AST: 19 U/L (ref 15–41)
Albumin: 3.8 g/dL (ref 3.5–5.0)
Alkaline Phosphatase: 63 U/L (ref 38–126)
Anion gap: 8 (ref 5–15)
BUN: 14 mg/dL (ref 6–20)
CALCIUM: 8.9 mg/dL (ref 8.9–10.3)
CO2: 25 mmol/L (ref 22–32)
Chloride: 110 mmol/L (ref 101–111)
Creatinine, Ser: 0.95 mg/dL (ref 0.44–1.00)
GFR calc non Af Amer: >60 mL/min (ref >60)
Glucose, Bld: 115 mg/dL — ABNORMAL HIGH (ref 65–99)
POTASSIUM: 3.4 mmol/L — AB (ref 3.5–5.1)
SODIUM: 143 mmol/L (ref 135–145)
Total Bilirubin: 0.8 mg/dL (ref 0.3–1.2)
Total Protein: 6.7 g/dL (ref 6.5–8.1)

## 2016-12-22 LAB — LIPID PANEL
Cholesterol: 209 mg/dL — ABNORMAL HIGH (ref 0–200)
HDL: 31 mg/dL — ABNORMAL LOW (ref >40)
LDL CALC: 140 mg/dL — AB (ref 0–99)
TRIGLYCERIDES: 189 mg/dL — AB (ref <150)
Total CHOL/HDL Ratio: 6.7 RATIO
VLDL: 38 mg/dL (ref 0–40)

## 2016-12-23 LAB — HEMOGLOBIN A1C
HEMOGLOBIN A1C: 6.3 % — AB (ref 4.8–5.6)
MEAN PLASMA GLUCOSE: 134 mg/dL

## 2016-12-23 NOTE — Progress Notes (Signed)
Advised  ED 

## 2017-01-28 DIAGNOSIS — L821 Other seborrheic keratosis: Secondary | ICD-10-CM | POA: Diagnosis not present

## 2017-01-28 DIAGNOSIS — L7211 Pilar cyst: Secondary | ICD-10-CM | POA: Diagnosis not present

## 2017-03-23 ENCOUNTER — Encounter: Payer: Self-pay | Admitting: Family Medicine

## 2017-03-23 ENCOUNTER — Ambulatory Visit (INDEPENDENT_AMBULATORY_CARE_PROVIDER_SITE_OTHER): Payer: 59 | Admitting: Family Medicine

## 2017-03-23 VITALS — BP 138/82 | HR 82 | Temp 98.0°F | Resp 16 | Wt 235.8 lb

## 2017-03-23 DIAGNOSIS — R7303 Prediabetes: Secondary | ICD-10-CM | POA: Diagnosis not present

## 2017-03-23 DIAGNOSIS — F17200 Nicotine dependence, unspecified, uncomplicated: Secondary | ICD-10-CM | POA: Diagnosis not present

## 2017-03-23 DIAGNOSIS — Z1231 Encounter for screening mammogram for malignant neoplasm of breast: Secondary | ICD-10-CM | POA: Diagnosis not present

## 2017-03-23 DIAGNOSIS — I10 Essential (primary) hypertension: Secondary | ICD-10-CM

## 2017-03-23 DIAGNOSIS — E785 Hyperlipidemia, unspecified: Secondary | ICD-10-CM | POA: Diagnosis not present

## 2017-03-23 DIAGNOSIS — Z5320 Procedure and treatment not carried out because of patient's decision for unspecified reasons: Secondary | ICD-10-CM

## 2017-03-23 DIAGNOSIS — E876 Hypokalemia: Secondary | ICD-10-CM

## 2017-03-23 DIAGNOSIS — Z1239 Encounter for other screening for malignant neoplasm of breast: Secondary | ICD-10-CM

## 2017-03-23 NOTE — Progress Notes (Signed)
Ruth Higgins  MRN: [id number] DOB: May 04, 1956  Subjective:  HPI   Patient is here for 4 months follow up. Last office visit was on 11/17/16. Had lab work done on 12/22/16 through work at [facility]. DM: patient is not checking her sugar. No numbness or tingling sensation. Lab Results  Component Value Date   HGBA1C 6.3 (H) 12/22/2016   B/P: patient is not checking her b/p. No chest pain or tightness issues. BP Readings from Last 3 Encounters:  03/23/17 138/82  11/17/16 136/84  06/04/16 124/78   Wt Readings from Last 3 Encounters:  03/23/17 235 lb 12.8 oz (107 kg)  12/01/16 234 lb (106.1 kg)  11/17/16 237 lb (107.5 kg)    Patient Active Problem List   Diagnosis Date Noted  . Personal history of tobacco use, presenting hazards to health 12/01/2016  . Encounter for screening colonoscopy 12/06/2015  . Abdominal mass 11/23/2015  . Neck mass 11/23/2015  . Hyperglycemia 05/20/2015  . Anxiety, mild 03/15/2015  . Allergic rhinitis 03/15/2015  . Depression, major, single episode, mild ([place]) 03/15/2015  . Essential (primary) hypertension 03/15/2015  . Fam hx-polycystic kidney 03/15/2015  . HLD (hyperlipidemia) 03/15/2015  . Cannot sleep 03/15/2015  . Menopause 03/15/2015  . Extreme obesity 03/15/2015  . Arthritis of knee, degenerative 03/15/2015  . Adult BMI 30+ 03/15/2015  . Arthritis, degenerative 03/15/2015  . Current tobacco use 03/15/2015    Past Medical History:  Diagnosis Date  . Hypertension     Social History   Social History  . Marital status: Married    Spouse name: N/A  . Number of children: N/A  . Years of education: N/A   Occupational History  . Not on file.   Social History Main Topics  . Smoking status: Current Every Day Smoker    Packs/day: 1.00    Years: 47.00    Types: Cigarettes  . Smokeless tobacco: Never Used     Comment: smokes due to stress; has quit several times  . Alcohol use No  . Drug use: No  . Sexual activity: Not on file    Other Topics Concern  . Not on file   Social History Narrative  . No narrative on file    Outpatient Encounter Prescriptions as of 03/23/2017  Medication Sig Note  . ibuprofen (ADVIL,MOTRIN) 400 MG tablet Take by mouth. 03/15/2015: Medication taken as needed.  Received from: [facility]  . lisinopril (PRINIVIL,ZESTRIL) 40 MG tablet TAKE 1 TABLET BY MOUTH DAILY   . loratadine (CLARITIN) 10 MG tablet Take 1 tablet (10 mg total) by mouth daily.   Marland Kitchen LORazepam (ATIVAN) 0.5 MG tablet Take 1 tablet (0.5 mg total) by mouth every 8 (eight) hours as needed for anxiety.   . metoprolol succinate (TOPROL-XL) 25 MG 24 hr tablet TAKE 1 TABLET BY MOUTH ONCE DAILY   . montelukast (SINGULAIR) 10 MG tablet Take 1 tablet (10 mg total) by mouth at bedtime.   . Vitamin D, Ergocalciferol, (DRISDOL) 50000 units CAPS capsule Take 1 capsule (50,000 Units total) by mouth every 7 (seven) days. (Patient not taking: Reported on 03/23/2017)   . [DISCONTINUED] chlorpheniramine-HYDROcodone (TUSSIONEX PENNKINETIC ER) 10-8 MG/5ML SUER Take 5 mLs by mouth every 12 (twelve) hours as needed for cough. (Patient not taking: Reported on 11/17/2016)   . [DISCONTINUED] furosemide (LASIX) 20 MG tablet Take 1 tablet (20 mg total) by mouth daily. As needed for leg swellin (Patient not taking: Reported on 06/11/2016)    No facility-administered encounter medications  on file as of 03/23/2017.     Allergies  Allergen Reactions  . Ceclor  [Cefaclor] Hives  . Demerol  [Meperidine]   . Erythromycin   . Flagyl  [Metronidazole]   . Hydrochlorothiazide     severe cramping in her legs/knee/calves  . Hydrocodone-Acetaminophen   . Penicillin G Hives  . Penicillins   . Sulfa Antibiotics Swelling    Review of Systems  Constitutional: Negative.   HENT: Positive for congestion.   Respiratory: Negative.   Cardiovascular: Negative.   Gastrointestinal: Negative.   Genitourinary: Positive for frequency (at night).   Musculoskeletal: Positive for joint pain.  Neurological: Positive for headaches.  Endo/Heme/Allergies: Negative.   Psychiatric/Behavioral: Negative for depression. The patient has insomnia.     Objective:  BP 138/82   Pulse 82   Temp 98 F (36.7 C)   Resp 16   Wt 235 lb 12.8 oz (107 kg)   BMI 44.55 kg/m   Physical Exam  Constitutional: She is oriented to person, place, and time and well-developed, well-nourished, and in no distress.  HENT:  Head: Normocephalic and atraumatic.  Right Ear: External ear normal.  Left Ear: External ear normal.  Nose: Nose normal.  Eyes: Pupils are equal, round, and reactive to light. Conjunctivae are normal.  Neck: Normal range of motion. Neck supple.  Cardiovascular: Normal rate, regular rhythm, normal heart sounds and intact distal pulses.  Exam reveals no gallop.   No murmur heard. Pulmonary/Chest: Effort normal and breath sounds normal. No respiratory distress. She has no wheezes.  Abdominal: Soft.  Neurological: She is alert and oriented to person, place, and time. GCS score is 15.  Skin: Skin is warm and dry.  Psychiatric: Mood, memory, affect and judgment normal.   Assessment and Plan :  1. Pre-diabetes Lab ordered to check A1C through her work. Pending results. Last A1C on 12/22/16 was 6.3. Work on habits.  2. Essential (primary) hypertension Stable. Patient declined getting Hepatitis C screening. 3. Hyperlipidemia, unspecified hyperlipidemia type 4. Colonoscopy refused Patient states she will contact Dr Fleet Contras and have colonoscopy done. Patient encouraged to get this done.  patient is noncompliant with almost  any recommendations on health maintenance and routine care 5. Breast cancer screening Order placed. - MM SCREENING BREAST TOMO BILATERAL; Future  6. Tobacco use disorder Patient encouraged to quit.   7. Low potassium Patient told to work on increasing potassium in her diet. Will follow. Patient does not want to take  Potassium supplements orally at this time.   HPI, Exam and A&P transcribed by Tiffany Kocher, RMA under direction and in the presence of Miguel Aschoff, MD. I have done the exam and reviewed the chart and it is accurate to the best of my knowledge. Development worker, community has been used and  any errors in dictation or transcription are unintentional. Miguel Aschoff M.D. [facility]

## 2017-03-23 NOTE — Patient Instructions (Signed)
Get mammogram and colonoscopy done please.

## 2017-05-24 ENCOUNTER — Other Ambulatory Visit
Admission: RE | Admit: 2017-05-24 | Discharge: 2017-05-24 | Disposition: A | Discharge status: Home / Self Care | Payer: 59 | Source: Ambulatory Visit | Attending: Family Medicine | Admitting: Family Medicine

## 2017-05-24 ENCOUNTER — Telehealth: Payer: Self-pay

## 2017-05-24 DIAGNOSIS — R7303 Prediabetes: Secondary | ICD-10-CM | POA: Diagnosis not present

## 2017-05-24 DIAGNOSIS — G4700 Insomnia, unspecified: Secondary | ICD-10-CM

## 2017-05-24 DIAGNOSIS — E876 Hypokalemia: Secondary | ICD-10-CM | POA: Insufficient documentation

## 2017-05-24 LAB — HEMOGLOBIN A1C
HEMOGLOBIN A1C: 6.3 % — AB (ref 4.8–5.6)
Mean Plasma Glucose: 134.11 mg/dL

## 2017-05-24 LAB — POTASSIUM: POTASSIUM: 3.7 mmol/L (ref 3.5–5.1)

## 2017-05-24 NOTE — Telephone Encounter (Signed)
-----   Message from Jerrol Banana., MD sent at 05/24/2017  1:49 PM EST ----- Stable.

## 2017-05-24 NOTE — Telephone Encounter (Signed)
Advised patient of results. Patient is requesting 90 day supply of all medications.

## 2017-05-25 MED ORDER — METOPROLOL SUCCINATE ER 25 MG PO TB24: 25.0000 mg | ORAL_TABLET | Freq: Every day | ORAL | 3 refills | Status: DC

## 2017-05-25 MED ORDER — LORAZEPAM 0.5 MG PO TABS: 0.5000 mg | ORAL_TABLET | Freq: Three times a day (TID) | ORAL | 0 refills | Status: DC | PRN

## 2017-05-25 MED ORDER — VITAMIN D (ERGOCALCIFEROL) 1.25 MG (50000 UNIT) PO CAPS: 50000.0000 [IU] | ORAL_CAPSULE | ORAL | 3 refills | Status: DC

## 2017-05-25 MED ORDER — LISINOPRIL 40 MG PO TABS: 40.0000 mg | ORAL_TABLET | Freq: Every day | ORAL | 3 refills | Status: DC

## 2017-05-25 NOTE — Telephone Encounter (Signed)
Done, called in Lorazepam for 90 day supply with no refill-Sabree Nuon V Shabria Egley, RMA

## 2017-05-25 NOTE — Telephone Encounter (Signed)
Ok --3 rf on everything but no rf on lorazepam.

## 2017-06-01 ENCOUNTER — Ambulatory Visit (INDEPENDENT_AMBULATORY_CARE_PROVIDER_SITE_OTHER): Payer: 59 | Admitting: Family Medicine

## 2017-06-01 ENCOUNTER — Encounter: Payer: Self-pay | Admitting: Family Medicine

## 2017-06-01 VITALS — BP 134/100 | HR 65 | Temp 97.8°F | Resp 16 | Wt 234.2 lb

## 2017-06-01 DIAGNOSIS — H1031 Unspecified acute conjunctivitis, right eye: Secondary | ICD-10-CM | POA: Diagnosis not present

## 2017-06-01 MED ORDER — POLYMYXIN B-TRIMETHOPRIM 10000-0.1 UNIT/ML-% OP SOLN: OPHTHALMIC | 0 refills | Status: DC

## 2017-06-01 NOTE — Patient Instructions (Signed)
Use frequent warm compresses several x day. If eye still crusting tomorrow, start the eye drops.

## 2017-06-01 NOTE — Progress Notes (Signed)
Subjective:     Patient ID: Ruth Higgins, female   DOB: 11/24/1955, 61 y.o.   MRN: [id number] Chief Complaint  Patient presents with  . Eye Drainage    Patient comes in office today with concerns of possible conjuctivitis in both her eyes since Thursday. Patient reports itching and crusting of lids in the AM, patient states that this morning she had pulled thick yellow discharge from her right eye.    HPI No change in vision or contact use. States eyelid is less swollen today.  Review of Systems     Objective:   Physical Exam  Constitutional: She appears well-developed and well-nourished. No distress.  Eyes: Pupils are equal, round, and reactive to light. Right eye exhibits no discharge. Left eye exhibits no discharge.  No sclera injection bilaterally.       Assessment:    1. Acute bacterial conjunctivitis of right eye; may resolve with warm compresses - trimethoprim-polymyxin b (POLYTRIM) ophthalmic solution; Two drops 3 x day in the right eye  Dispense: 10 mL; Refill: 0    Plan:    Start eye drops if not improving tomorrow AM. Start frequent warm compresses today.

## 2017-06-24 DIAGNOSIS — J069 Acute upper respiratory infection, unspecified: Secondary | ICD-10-CM | POA: Diagnosis not present

## 2017-08-17 ENCOUNTER — Ambulatory Visit: Payer: 59 | Admitting: Family Medicine

## 2017-08-17 VITALS — BP 152/84 | HR 72 | Temp 97.8°F | Resp 16 | Wt 233.0 lb

## 2017-08-17 DIAGNOSIS — I10 Essential (primary) hypertension: Secondary | ICD-10-CM | POA: Diagnosis not present

## 2017-08-17 DIAGNOSIS — Z720 Tobacco use: Secondary | ICD-10-CM | POA: Diagnosis not present

## 2017-08-17 DIAGNOSIS — G4700 Insomnia, unspecified: Secondary | ICD-10-CM | POA: Diagnosis not present

## 2017-08-17 DIAGNOSIS — J309 Allergic rhinitis, unspecified: Secondary | ICD-10-CM

## 2017-08-17 MED ORDER — VITAMIN D (ERGOCALCIFEROL) 1.25 MG (50000 UNIT) PO CAPS: 50000.0000 [IU] | ORAL_CAPSULE | ORAL | 3 refills | Status: DC

## 2017-08-17 MED ORDER — LORATADINE 10 MG PO TABS
10.0000 mg | ORAL_TABLET | Freq: Every day | ORAL | 11 refills | Status: DC
Start: 2017-08-17 — End: 2017-11-04

## 2017-08-17 MED ORDER — LORAZEPAM 0.5 MG PO TABS: 0.5000 mg | ORAL_TABLET | Freq: Two times a day (BID) | ORAL | 3 refills | Status: DC | PRN

## 2017-08-17 MED ORDER — LISINOPRIL 40 MG PO TABS: 40.0000 mg | ORAL_TABLET | Freq: Every day | ORAL | 3 refills | Status: DC

## 2017-08-17 MED ORDER — METOPROLOL SUCCINATE ER 50 MG PO TB24: 50.0000 mg | ORAL_TABLET | Freq: Every day | ORAL | 3 refills | Status: DC

## 2017-08-17 MED ORDER — METOPROLOL SUCCINATE ER 25 MG PO TB24: 25.0000 mg | ORAL_TABLET | Freq: Every day | ORAL | 3 refills | Status: DC

## 2017-08-17 NOTE — Progress Notes (Signed)
Ruth Higgins  MRN: [id number] DOB: 08-Apr-1956  Subjective:  HPI   The patient is a 62 year old female who presents for follow up of her hypertension.  She was last seen on 06/01/17 for an acute visit and her last chronic visit was on 03/23/17.   She does not check her blood pressure of glucose outside of the office.  She does not want to have labs checked but every 6 months instead of every 3 months.     Patient Active Problem List   Diagnosis Date Noted  . Personal history of tobacco use, presenting hazards to health 12/01/2016  . Encounter for screening colonoscopy 12/06/2015  . Abdominal mass 11/23/2015  . Neck mass 11/23/2015  . Hyperglycemia 05/20/2015  . Anxiety, mild 03/15/2015  . Allergic rhinitis 03/15/2015  . Depression, major, single episode, mild ([place]) 03/15/2015  . Essential (primary) hypertension 03/15/2015  . Fam hx-polycystic kidney 03/15/2015  . HLD (hyperlipidemia) 03/15/2015  . Cannot sleep 03/15/2015  . Menopause 03/15/2015  . Extreme obesity 03/15/2015  . Arthritis of knee, degenerative 03/15/2015  . Adult BMI 30+ 03/15/2015  . Arthritis, degenerative 03/15/2015  . Current tobacco use 03/15/2015    Past Medical History:  Diagnosis Date  . Hypertension     Social History   Socioeconomic History  . Marital status: Married    Spouse name: Not on file  . Number of children: Not on file  . Years of education: Not on file  . Highest education level: Not on file  Social Needs  . Financial resource strain: Not on file  . Food insecurity - worry: Not on file  . Food insecurity - inability: Not on file  . Transportation needs - medical: Not on file  . Transportation needs - non-medical: Not on file  Occupational History  . Not on file  Tobacco Use  . Smoking status: Current Every Day Smoker    Packs/day: 1.00    Years: 47.00    Pack years: 47.00    Types: Cigarettes  . Smokeless tobacco: Never Used  . Tobacco comment: smokes due to  stress; has quit several times  Substance and Sexual Activity  . Alcohol use: No    Alcohol/week: 0.0 oz  . Drug use: No  . Sexual activity: Not on file  Other Topics Concern  . Not on file  Social History Narrative  . Not on file    Outpatient Encounter Medications as of 08/17/2017  Medication Sig Note  . ibuprofen (ADVIL,MOTRIN) 400 MG tablet Take by mouth. 03/15/2015: Medication taken as needed.  Received from: [facility]  . lisinopril (PRINIVIL,ZESTRIL) 40 MG tablet Take 1 tablet (40 mg total) by mouth daily.   Marland Kitchen loratadine (CLARITIN) 10 MG tablet Take 1 tablet (10 mg total) by mouth daily.   Marland Kitchen LORazepam (ATIVAN) 0.5 MG tablet Take 1 tablet (0.5 mg total) by mouth every 8 (eight) hours as needed for anxiety.   . metoprolol succinate (TOPROL-XL) 25 MG 24 hr tablet Take 1 tablet (25 mg total) by mouth daily.   . Vitamin D, Ergocalciferol, (DRISDOL) 50000 units CAPS capsule Take 1 capsule (50,000 Units total) by mouth every 7 (seven) days.   . [DISCONTINUED] montelukast (SINGULAIR) 10 MG tablet Take 1 tablet (10 mg total) by mouth at bedtime.   . [DISCONTINUED] trimethoprim-polymyxin b (POLYTRIM) ophthalmic solution Two drops 3 x day in the right eye    No facility-administered encounter medications on file as of 08/17/2017.  Allergies  Allergen Reactions  . Ceclor  [Cefaclor] Hives  . Demerol  [Meperidine]   . Erythromycin   . Flagyl  [Metronidazole]   . Hydrochlorothiazide     severe cramping in her legs/knee/calves  . Hydrocodone-Acetaminophen   . Penicillin G Hives  . Penicillins   . Sulfa Antibiotics Swelling    Review of Systems  Constitutional: Negative for fever and malaise/fatigue.  Respiratory: Negative for cough, shortness of breath and wheezing.   Cardiovascular: Negative for chest pain, palpitations and leg swelling.  Gastrointestinal: Negative.   Genitourinary: Negative for frequency.  Musculoskeletal: Negative.   Neurological: Negative  for dizziness, weakness and headaches.  Endo/Heme/Allergies: Negative.  Negative for polydipsia.  Psychiatric/Behavioral: Negative.     Objective:  BP (!) 152/84 (BP Location: Left Arm, Patient Position: Sitting, Cuff Size: Large)   Pulse 72   Temp 97.8 F (36.6 C) (Oral)   Resp 16   Wt 233 lb (105.7 kg)   BMI 44.02 kg/m   Physical Exam  Constitutional: She is oriented to person, place, and time and well-developed, well-nourished, and in no distress.  Obese WF NAD  HENT:  Head: Normocephalic and atraumatic.  Eyes: Conjunctivae are normal.  Neck: Neck supple. No thyromegaly present.  Cardiovascular: Normal rate, regular rhythm and normal heart sounds.  Pulmonary/Chest: Effort normal and breath sounds normal.  Abdominal: Soft.  Neurological: She is alert and oriented to person, place, and time. Gait normal. GCS score is 15.  Skin: Skin is warm and dry.  Psychiatric: Mood, memory and affect normal.    Assessment and Plan :  1. Insomnia, unspecified type  - LORazepam (ATIVAN) 0.5 MG tablet; Take 1 tablet (0.5 mg total) by mouth 2 (two) times daily as needed for anxiety.  Dispense: 60 tablet; Refill: 3  2. Allergic rhinitis, unspecified seasonality, unspecified trigger  - loratadine (CLARITIN) 10 MG tablet; Take 1 tablet (10 mg total) by mouth daily.  Dispense: 30 tablet; Refill: 11 3.Tobacco Abuse Pt encouraged to stop. 4.Obesity 5.HTN Pt will follow.Increase Metoprolol to 50mg  daily. CPE 3-6 months.  I have done the exam and reviewed the chart and it is accurate to the best of my knowledge. Development worker, community has been used and  any errors in dictation or transcription are unintentional. Miguel Aschoff M.D. [facility]

## 2017-11-04 ENCOUNTER — Ambulatory Visit (INDEPENDENT_AMBULATORY_CARE_PROVIDER_SITE_OTHER): Payer: 59 | Admitting: Family Medicine

## 2017-11-04 ENCOUNTER — Encounter: Payer: Self-pay | Admitting: Family Medicine

## 2017-11-04 VITALS — BP 182/130 | HR 64 | Temp 98.0°F | Resp 16 | Ht 61.0 in | Wt 226.0 lb

## 2017-11-04 DIAGNOSIS — Z6841 Body Mass Index (BMI) 40.0 and over, adult: Secondary | ICD-10-CM

## 2017-11-04 DIAGNOSIS — R739 Hyperglycemia, unspecified: Secondary | ICD-10-CM

## 2017-11-04 DIAGNOSIS — Z720 Tobacco use: Secondary | ICD-10-CM

## 2017-11-04 DIAGNOSIS — I10 Essential (primary) hypertension: Secondary | ICD-10-CM | POA: Diagnosis not present

## 2017-11-04 DIAGNOSIS — E785 Hyperlipidemia, unspecified: Secondary | ICD-10-CM

## 2017-11-04 DIAGNOSIS — Z0000 Encounter for general adult medical examination without abnormal findings: Secondary | ICD-10-CM

## 2017-11-04 NOTE — Assessment & Plan Note (Signed)
Discussed low-carb diet Recheck A1c 

## 2017-11-04 NOTE — Assessment & Plan Note (Addendum)
Quite elevated today Patient truly believes this is related to anxiety about physical today She is taking medication with good compliance Will come back for BP check in few weeks Is asymptomatic but knows red flag symptoms to seek care for

## 2017-11-04 NOTE — Progress Notes (Signed)
Patient: Ruth Higgins, Female    DOB: [birth year], 62 y.o.   MRN: [id number] Visit Date: 11/04/2017  Today's Provider: Lavon Paganini, MD   I, Martha Clan, CMA, am acting as scribe for Lavon Paganini, MD.  Chief Complaint  Patient presents with  . Annual Exam   Subjective:    Annual physical exam Ruth Higgins is a 62 y.o. female who presents today for health maintenance and complete physical. She feels fairly well. She is c/o sinus pressure due to allergies. She also states she has a chronic cough with lisinopril. She reports exercising none, outside of work. Has difficulty exercising due to knee pain. She reports she is sleeping poorly. Wakes up every 2-3 hours.  Last mammogram- never Last colonoscopy- never; is going to call Dr. Bary Castilla when she is ready for this. Last pap- S/P hysterectomy on 12/20/2012. She is experiencing menopause sx, including heat intolerance. She states heat will cause her BP to elevate. -----------------------------------------------------------------   Review of Systems  Constitutional: Negative.   HENT: Positive for congestion, sinus pressure and sneezing. Negative for dental problem, drooling, ear discharge, ear pain, facial swelling, hearing loss, mouth sores, nosebleeds, postnasal drip, rhinorrhea, sinus pain, sore throat, tinnitus, trouble swallowing and voice change.   Eyes: Positive for itching. Negative for photophobia, pain, discharge, redness and visual disturbance.  Respiratory: Positive for cough. Negative for apnea, choking, chest tightness, shortness of breath, wheezing and stridor.   Cardiovascular: Negative.   Gastrointestinal: Positive for abdominal distention and constipation. Negative for abdominal pain, anal bleeding, blood in stool, diarrhea, nausea, rectal pain and vomiting.  Endocrine: Positive for heat intolerance. Negative for cold intolerance, polydipsia, polyphagia and polyuria.  Genitourinary: Negative.     Musculoskeletal: Positive for arthralgias. Negative for back pain, gait problem, joint swelling, myalgias, neck pain and neck stiffness.  Skin: Negative.   Allergic/Immunologic: Negative.   Neurological: Negative.   Hematological: Negative.   Psychiatric/Behavioral: Positive for sleep disturbance. Negative for agitation, behavioral problems, confusion, decreased concentration, dysphoric mood, hallucinations, self-injury and suicidal ideas. The patient is nervous/anxious. The patient is not hyperactive.     Social History      She  reports that she has been smoking cigarettes.  She has a 47.00 pack-year smoking history. She has never used smokeless tobacco. She reports that she does not drink alcohol or use drugs.       Social History   Socioeconomic History  . Marital status: Married    Spouse name: Not on file  . Number of children: Not on file  . Years of education: Not on file  . Highest education level: Not on file  Occupational History  . Not on file  Social Needs  . Financial resource strain: Not on file  . Food insecurity:    Worry: Not on file    Inability: Not on file  . Transportation needs:    Medical: Not on file    Non-medical: Not on file  Tobacco Use  . Smoking status: Current Every Day Smoker    Packs/day: 1.00    Years: 47.00    Pack years: 47.00    Types: Cigarettes  . Smokeless tobacco: Never Used  . Tobacco comment: smokes due to stress; has quit several times  Substance and Sexual Activity  . Alcohol use: No    Alcohol/week: 0.0 oz  . Drug use: No  . Sexual activity: Not on file  Lifestyle  . Physical activity:    Days  per week: Not on file    Minutes per session: Not on file  . Stress: Not on file  Relationships  . Social connections:    Talks on phone: Not on file    Gets together: Not on file    Attends religious service: Not on file    Active member of club or organization: Not on file    Attends meetings of clubs or organizations: Not  on file    Relationship status: Not on file  Other Topics Concern  . Not on file  Social History Narrative  . Not on file    Past Medical History:  Diagnosis Date  . Hypertension      Patient Active Problem List   Diagnosis Date Noted  . Personal history of tobacco use, presenting hazards to health 12/01/2016  . Encounter for screening colonoscopy 12/06/2015  . Abdominal mass 11/23/2015  . Neck mass 11/23/2015  . Hyperglycemia 05/20/2015  . Anxiety, mild 03/15/2015  . Allergic rhinitis 03/15/2015  . Depression, major, single episode, mild ([place]) 03/15/2015  . Essential (primary) hypertension 03/15/2015  . Fam hx-polycystic kidney 03/15/2015  . HLD (hyperlipidemia) 03/15/2015  . Cannot sleep 03/15/2015  . Menopause 03/15/2015  . Extreme obesity 03/15/2015  . Arthritis of knee, degenerative 03/15/2015  . Adult BMI 30+ 03/15/2015  . Arthritis, degenerative 03/15/2015  . Current tobacco use 03/15/2015    Past Surgical History:  Procedure Laterality Date  . ABDOMINAL HYSTERECTOMY  2014   Dr. Sabra Heck  . APPENDECTOMY    . HERNIA REPAIR  2014   Dr. Pat Patrick  . KNEE SURGERY Bilateral 2007  . LIPOMA EXCISION  1999   of the left side of the neck  . NECK SURGERY    . SALPINGOOPHORECTOMY Bilateral   . TONSILLECTOMY    . TUBAL LIGATION      Family History        Family Status  Relation Name Status  . Father  Deceased  . Sister  Alive  . Brother  Alive  . Sister  Alive  . Mother  Alive  . Brother  (Not Specified)        Her family history includes Alzheimer's disease in her father; Anxiety disorder in her sister; Depression in her sister; Heart disease in her father; Hypertension in her brother and sister; Hypothyroidism in her sister; Polycystic kidney disease in her brother; Rectal cancer in her brother; Stroke in her mother.      Allergies  Allergen Reactions  . Ceclor  [Cefaclor] Hives  . Demerol  [Meperidine]   . Erythromycin   . Flagyl  [Metronidazole]   .  Hydrochlorothiazide     severe cramping in her legs/knee/calves  . Hydrocodone-Acetaminophen   . Penicillin G Hives  . Penicillins   . Sulfa Antibiotics Swelling     Current Outpatient Medications:  .  ibuprofen (ADVIL,MOTRIN) 400 MG tablet, Take by mouth., Disp: , Rfl:  .  lisinopril (PRINIVIL,ZESTRIL) 40 MG tablet, Take 1 tablet (40 mg total) by mouth daily., Disp: 90 tablet, Rfl: 3 .  loratadine (CLARITIN) 10 MG tablet, Take 1 tablet (10 mg total) by mouth daily., Disp: 30 tablet, Rfl: 11 .  LORazepam (ATIVAN) 0.5 MG tablet, Take 1 tablet (0.5 mg total) by mouth 2 (two) times daily as needed for anxiety., Disp: 60 tablet, Rfl: 3 .  metoprolol succinate (TOPROL-XL) 50 MG 24 hr tablet, Take 1 tablet (50 mg total) by mouth daily., Disp: 90 tablet, Rfl: 3 .  Vitamin D,  Ergocalciferol, (DRISDOL) 50000 units CAPS capsule, Take 1 capsule (50,000 Units total) by mouth every 7 (seven) days., Disp: 12 capsule, Rfl: 3   Patient Care Team: Jerrol Banana., MD as PCP - General (Family Medicine) Jerrol Banana., MD (Family Medicine) Bary Castilla Forest Gleason, MD (General Surgery)      Objective:   Vitals: There were no vitals taken for this visit.  There were no vitals filed for this visit.   Physical Exam   Depression Screen PHQ 2/9 Scores 03/23/2017 06/11/2016  PHQ - 2 Score 0 0  PHQ- 9 Score 0 -      Assessment & Plan:     Routine Health Maintenance and Physical Exam  Exercise Activities and Dietary recommendations Goals    None      Immunization History  Administered Date(s) Administered  . Tdap 07/26/2013    Health Maintenance  Topic Date Due  . HIV Screening  05/24/1970  . MAMMOGRAM  05/24/2005  . COLONOSCOPY  05/24/2005  . Hepatitis C Screening  03/09/2018 (Originally [birth year])  . INFLUENZA VACCINE  02/03/2018  . TETANUS/TDAP  07/27/2023    Discussed health benefits of physical activity, and encouraged her to engage in regular exercise appropriate  for her age and condition.    -------------------------------------------------------------------- Patient declines mammogram, colonoscopy or other screenings at this time.  She believes that she previously had Hep C screening.  She will look for this result.  She states that she will eventually get her screenings but is anxious and needs to prep herself beforehand.  Problem List Items Addressed This Visit      Cardiovascular and Mediastinum   Essential (primary) hypertension    Quite elevated today Patient truly believes this is related to anxiety about physical today She is taking medication with good compliance Will come back for BP check in few weeks Is asymptomatic but knows red flag symptoms to seek care for        Other   HLD (hyperlipidemia)    Recheck lipid panel  Will calculate ASCVD risk      Relevant Orders   Comprehensive metabolic panel   Lipid panel   Obesity    Discussed diet and exercise      Current tobacco use    Encouraged cessation      Hyperglycemia    Discussed low carb diet Recheck A1c      Relevant Orders   Hemoglobin A1c    Other Visit Diagnoses    Encounter for annual physical exam    -  Primary       Return in about 3 months (around 02/04/2018) for PCP chronic disease f/u.   The entirety of the information documented in the History of Present Illness, Review of Systems and Physical Exam were personally obtained by me. Portions of this information were initially documented by Raquel Sarna Ratchford, CMA and reviewed by me for thoroughness and accuracy.    Virginia Crews, MD, MPH [facility] 11/04/2017 10:10 AM

## 2017-11-04 NOTE — Patient Instructions (Addendum)
Preventive Care 40-64 Years, Female Preventive care refers to lifestyle choices and visits with your health care provider that can promote health and wellness. What does preventive care include?  A yearly physical exam. This is also called an annual well check.  Dental exams once or twice a year.  Routine eye exams. Ask your health care provider how often you should have your eyes checked.  Personal lifestyle choices, including: ? Daily care of your teeth and gums. ? Regular physical activity. ? Eating a healthy diet. ? Avoiding tobacco and drug use. ? Limiting alcohol use. ? Practicing safe sex. ? Taking low-dose aspirin daily starting at age 58. ? Taking vitamin and mineral supplements as recommended by your health care provider. What happens during an annual well check? The services and screenings done by your health care provider during your annual well check will depend on your age, overall health, lifestyle risk factors, and family history of disease. Counseling Your health care provider may ask you questions about your:  Alcohol use.  Tobacco use.  Drug use.  Emotional well-being.  Home and relationship well-being.  Sexual activity.  Eating habits.  Work and work Statistician.  Method of birth control.  Menstrual cycle.  Pregnancy history.  Screening You may have the following tests or measurements:  Height, weight, and BMI.  Blood pressure.  Lipid and cholesterol levels. These may be checked every 5 years, or more frequently if you are over 81 years old.  Skin check.  Lung cancer screening. You may have this screening every year starting at age 78 if you have a 30-pack-year history of smoking and currently smoke or have quit within the past 15 years.  Fecal occult blood test (FOBT) of the stool. You may have this test every year starting at age 65.  Flexible sigmoidoscopy or colonoscopy. You may have a sigmoidoscopy every 5 years or a colonoscopy  every 10 years starting at age 30.  Hepatitis C blood test.  Hepatitis B blood test.  Sexually transmitted disease (STD) testing.  Diabetes screening. This is done by checking your blood sugar (glucose) after you have not eaten for a while (fasting). You may have this done every 1-3 years.  Mammogram. This may be done every 1-2 years. Talk to your health care provider about when you should start having regular mammograms. This may depend on whether you have a family history of breast cancer.  BRCA-related cancer screening. This may be done if you have a family history of breast, ovarian, tubal, or peritoneal cancers.  Pelvic exam and Pap test. This may be done every 3 years starting at age 80. Starting at age 36, this may be done every 5 years if you have a Pap test in combination with an HPV test.  Bone density scan. This is done to screen for osteoporosis. You may have this scan if you are at high risk for osteoporosis.  Discuss your test results, treatment options, and if necessary, the need for more tests with your health care provider. Vaccines Your health care provider may recommend certain vaccines, such as:  Influenza vaccine. This is recommended every year.  Tetanus, diphtheria, and acellular pertussis (Tdap, Td) vaccine. You may need a Td booster every 10 years.  Varicella vaccine. You may need this if you have not been vaccinated.  Zoster vaccine. You may need this after age 5.  Measles, mumps, and rubella (MMR) vaccine. You may need at least one dose of MMR if you were born in  1958 or later. You may also need a second dose.  Pneumococcal 13-valent conjugate (PCV13) vaccine. You may need this if you have certain conditions and were not previously vaccinated.  Pneumococcal polysaccharide (PPSV23) vaccine. You may need one or two doses if you smoke cigarettes or if you have certain conditions.  Meningococcal vaccine. You may need this if you have certain  conditions.  Hepatitis A vaccine. You may need this if you have certain conditions or if you travel or work in places where you may be exposed to hepatitis A.  Hepatitis B vaccine. You may need this if you have certain conditions or if you travel or work in places where you may be exposed to hepatitis B.  Haemophilus influenzae type b (Hib) vaccine. You may need this if you have certain conditions.  Talk to your health care provider about which screenings and vaccines you need and how often you need them. This information is not intended to replace advice given to you by your health care provider. Make sure you discuss any questions you have with your health care provider. Document Released: 07/19/2015 Document Revised: 03/11/2016 Document Reviewed: 04/23/2015 Elsevier Interactive Patient Education  2018 [employer].      Diet Recommendations for Diabetes   Starchy (carb) foods include: Bread, rice, pasta, potatoes, corn, crackers, bagels, muffins, all baked goods.  (Fruits, milk, and yogurt also have carbohydrate, but most of these foods will not spike your blood sugar as the starchy foods will.)  A few fruits do cause high blood sugars; use small portions of bananas (limit to 1/2 at a time), grapes, watermelon, and most tropical fruits.    Protein foods include: Meat, fish, poultry, eggs, dairy foods, and beans such as pinto and kidney beans (beans also provide carbohydrate).   1. Eat at least 3 meals and 1-2 snacks per day. Never go more than 4-5 hours while awake without eating. Eat breakfast within the first hour of getting up.   2. Limit starchy foods to TWO per meal and ONE per snack. ONE portion of a starchy  food is equal to the following:   - ONE slice of bread (or its equivalent, such as half of a hamburger bun).   - 1/2 cup of a "scoopable" starchy food such as potatoes or rice.   - 15 grams of carbohydrate as shown on food label.  3. Include at every meal: a protein food, a  carb food, and vegetables and/or fruit.   - Obtain twice as many veg's as protein or carbohydrate foods for both lunch and dinner.   - Fresh or frozen veg's are best.   - Try to keep frozen veg's on hand for a quick vegetable serving.

## 2017-11-04 NOTE — Assessment & Plan Note (Signed)
Recheck lipid panel  Will calculate ASCVD risk

## 2017-11-04 NOTE — Assessment & Plan Note (Signed)
Discussed diet and exercise 

## 2017-11-04 NOTE — Assessment & Plan Note (Signed)
Encouraged cessation.

## 2017-11-16 DIAGNOSIS — G8929 Other chronic pain: Secondary | ICD-10-CM | POA: Diagnosis not present

## 2017-11-16 DIAGNOSIS — M170 Bilateral primary osteoarthritis of knee: Secondary | ICD-10-CM | POA: Diagnosis not present

## 2017-11-16 DIAGNOSIS — M25561 Pain in right knee: Secondary | ICD-10-CM | POA: Diagnosis not present

## 2017-11-16 DIAGNOSIS — M25562 Pain in left knee: Secondary | ICD-10-CM | POA: Diagnosis not present

## 2017-11-24 ENCOUNTER — Telehealth: Payer: Self-pay | Admitting: *Deleted

## 2017-11-24 DIAGNOSIS — Z87891 Personal history of nicotine dependence: Secondary | ICD-10-CM

## 2017-11-24 DIAGNOSIS — Z122 Encounter for screening for malignant neoplasm of respiratory organs: Secondary | ICD-10-CM

## 2017-11-24 NOTE — Telephone Encounter (Signed)
Notified patient that annual lung cancer screening low dose CT scan is due currently or will be in near future. Confirmed that patient is within the age range of 55-77, and asymptomatic, (no signs or symptoms of lung cancer). Patient denies illness that would prevent curative treatment for lung cancer if found. Verified smoking history, (current, 48 pack year). The shared decision making visit was done 12/01/16. Patient is agreeable for CT scan being scheduled.

## 2017-12-01 ENCOUNTER — Ambulatory Visit
Admission: RE | Admit: 2017-12-01 | Discharge: 2017-12-01 | Disposition: A | Discharge status: Home / Self Care | Payer: 59 | Source: Ambulatory Visit | Attending: Oncology | Admitting: Oncology

## 2017-12-01 DIAGNOSIS — D3502 Benign neoplasm of left adrenal gland: Secondary | ICD-10-CM | POA: Insufficient documentation

## 2017-12-01 DIAGNOSIS — Z87891 Personal history of nicotine dependence: Secondary | ICD-10-CM | POA: Diagnosis not present

## 2017-12-01 DIAGNOSIS — F17210 Nicotine dependence, cigarettes, uncomplicated: Secondary | ICD-10-CM | POA: Diagnosis not present

## 2017-12-01 DIAGNOSIS — Z122 Encounter for screening for malignant neoplasm of respiratory organs: Secondary | ICD-10-CM | POA: Diagnosis not present

## 2017-12-06 ENCOUNTER — Encounter: Payer: Self-pay | Admitting: *Deleted

## 2018-01-16 NOTE — Discharge Instructions (Signed)
°  Instructions after Total Knee Replacement ° ° Kinleigh Nault P. Albertine Lafoy, Jr., M.D.    ° Dept. of Orthopaedics & Sports Medicine ° [facility] ° [address] ° [place], [place]  [address] ° Phone: [phone]   Fax: [phone] ° °  °DIET: °• Drink plenty of non-alcoholic fluids. °• Resume your normal diet. Include foods high in fiber. ° °ACTIVITY:  °• You may use crutches or a walker with weight-bearing as tolerated, unless instructed otherwise. °• You may be weaned off of the walker or crutches by your Physical Therapist.  °• Do NOT place pillows under the knee. Anything placed under the knee could limit your ability to straighten the knee.   °• Continue doing gentle exercises. Exercising will reduce the pain and swelling, increase motion, and prevent muscle weakness.   °• Please continue to use the TED compression stockings for 6 weeks. You may remove the stockings at night, but should reapply them in the morning. °• Do not drive or operate any equipment until instructed. ° °WOUND CARE:  °• Continue to use the PolarCare or ice packs periodically to reduce pain and swelling. °• You may bathe or shower after the staples are removed at the first office visit following surgery. ° °MEDICATIONS: °• You may resume your regular medications. °• Please take the pain medication as prescribed on the medication. °• Do not take pain medication on an empty stomach. °• You have been given a prescription for a blood thinner (Lovenox or Coumadin). Please take the medication as instructed. (NOTE: After completing a 2 week course of Lovenox, take one Enteric-coated aspirin once a day. This along with elevation will help reduce the possibility of phlebitis in your operated leg.) °• Do not drive or drink alcoholic beverages when taking pain medications. ° °CALL THE OFFICE FOR: °• Temperature above 101 degrees °• Excessive bleeding or drainage on the dressing. °• Excessive swelling, coldness, or paleness of the toes. °• Persistent  nausea and vomiting. ° °FOLLOW-UP:  °• You should have an appointment to return to the office in 10-14 days after surgery. °• Arrangements have been made for continuation of Physical Therapy (either home therapy or outpatient therapy). °  °

## 2018-01-18 ENCOUNTER — Telehealth: Payer: Self-pay

## 2018-01-18 ENCOUNTER — Other Ambulatory Visit: Payer: Self-pay

## 2018-01-18 ENCOUNTER — Other Ambulatory Visit: Payer: Self-pay | Admitting: *Deleted

## 2018-01-18 DIAGNOSIS — R739 Hyperglycemia, unspecified: Secondary | ICD-10-CM

## 2018-01-18 DIAGNOSIS — E785 Hyperlipidemia, unspecified: Secondary | ICD-10-CM

## 2018-01-18 NOTE — Telephone Encounter (Signed)
error 

## 2018-01-19 ENCOUNTER — Other Ambulatory Visit: Payer: Self-pay

## 2018-01-19 ENCOUNTER — Encounter
Admission: RE | Admit: 2018-01-19 | Discharge: 2018-01-19 | Disposition: A | Discharge status: Home / Self Care | Payer: 59 | Source: Ambulatory Visit | Attending: Orthopedic Surgery | Admitting: Orthopedic Surgery

## 2018-01-19 DIAGNOSIS — R001 Bradycardia, unspecified: Secondary | ICD-10-CM | POA: Insufficient documentation

## 2018-01-19 DIAGNOSIS — Z01810 Encounter for preprocedural cardiovascular examination: Secondary | ICD-10-CM | POA: Diagnosis not present

## 2018-01-19 DIAGNOSIS — I10 Essential (primary) hypertension: Secondary | ICD-10-CM | POA: Insufficient documentation

## 2018-01-19 DIAGNOSIS — Z01812 Encounter for preprocedural laboratory examination: Secondary | ICD-10-CM | POA: Diagnosis not present

## 2018-01-19 HISTORY — DX: Unspecified osteoarthritis, unspecified site: M19.90

## 2018-01-19 HISTORY — DX: Other specified postprocedural states: R11.2

## 2018-01-19 HISTORY — DX: Prediabetes: R73.03

## 2018-01-19 HISTORY — DX: Family history of other specified conditions: Z84.89

## 2018-01-19 HISTORY — DX: Other specified postprocedural states: Z98.890

## 2018-01-19 LAB — HEMOGLOBIN A1C
HEMOGLOBIN A1C: 6.3 % — AB (ref 4.8–5.6)
Mean Plasma Glucose: 134.11 mg/dL

## 2018-01-19 LAB — LIPID PANEL
Cholesterol: 207 mg/dL — ABNORMAL HIGH (ref 0–200)
HDL: 34 mg/dL — ABNORMAL LOW (ref >40)
LDL CALC: 143 mg/dL — AB (ref 0–99)
Total CHOL/HDL Ratio: 6.1 RATIO
Triglycerides: 150 mg/dL — ABNORMAL HIGH (ref <150)
VLDL: 30 mg/dL (ref 0–40)

## 2018-01-19 LAB — PROTIME-INR
INR: 0.89
Prothrombin Time: 12 seconds (ref 11.4–15.2)

## 2018-01-19 LAB — TYPE AND SCREEN
ABO/RH(D): B NEG
Antibody Screen: NEGATIVE

## 2018-01-19 LAB — COMPREHENSIVE METABOLIC PANEL
ALBUMIN: 3.9 g/dL (ref 3.5–5.0)
ALK PHOS: 74 U/L (ref 38–126)
ALT: 19 U/L (ref 0–44)
AST: 17 U/L (ref 15–41)
Anion gap: 7 (ref 5–15)
BUN: 15 mg/dL (ref 8–23)
CHLORIDE: 108 mmol/L (ref 98–111)
CO2: 26 mmol/L (ref 22–32)
CREATININE: 0.81 mg/dL (ref 0.44–1.00)
Calcium: 8.8 mg/dL — ABNORMAL LOW (ref 8.9–10.3)
GFR calc non Af Amer: >60 mL/min (ref >60)
GLUCOSE: 123 mg/dL — AB (ref 70–99)
Potassium: 3.5 mmol/L (ref 3.5–5.1)
SODIUM: 141 mmol/L (ref 135–145)
Total Bilirubin: 0.9 mg/dL (ref 0.3–1.2)
Total Protein: 7.1 g/dL (ref 6.5–8.1)

## 2018-01-19 LAB — APTT: aPTT: 31 seconds (ref 24–36)

## 2018-01-19 LAB — SEDIMENTATION RATE: SED RATE: 7 mm/h (ref 0–30)

## 2018-01-19 LAB — URINALYSIS, ROUTINE W REFLEX MICROSCOPIC
Bilirubin Urine: NEGATIVE
GLUCOSE, UA: NEGATIVE mg/dL
HGB URINE DIPSTICK: NEGATIVE
KETONES UR: NEGATIVE mg/dL
Leukocytes, UA: NEGATIVE
Nitrite: NEGATIVE
PROTEIN: NEGATIVE mg/dL
Specific Gravity, Urine: 1.017 (ref 1.005–1.030)
pH: 6 (ref 5.0–8.0)

## 2018-01-19 LAB — SURGICAL PCR SCREEN
MRSA, PCR: NEGATIVE
STAPHYLOCOCCUS AUREUS: NEGATIVE

## 2018-01-19 LAB — C-REACTIVE PROTEIN: CRP: <0.8 mg/dL (ref <1.0)

## 2018-01-19 LAB — CBC
HCT: 44.2 % (ref 35.0–47.0)
HEMOGLOBIN: 14.7 g/dL (ref 12.0–16.0)
MCH: 29.6 pg (ref 26.0–34.0)
MCHC: 33.3 g/dL (ref 32.0–36.0)
MCV: 88.8 fL (ref 80.0–100.0)
PLATELETS: 194 10*3/uL (ref 150–440)
RBC: 4.98 MIL/uL (ref 3.80–5.20)
RDW: 14.3 % (ref 11.5–14.5)
WBC: 8.6 10*3/uL (ref 3.6–11.0)

## 2018-01-19 NOTE — Patient Instructions (Addendum)
  Your procedure is scheduled on: Monday January 31, 2018 Report to Same Day Surgery 2nd floor medical mall ([place] Entrance-take elevator on left to 2nd floor.  Check in with surgery information desk.) To find out your arrival time please call [phone] between 1PM - 3PM on Friday January 28, 2018  Remember: Instructions that are not followed completely may result in serious medical risk, up to and including death, or upon the discretion of your surgeon and anesthesiologist your surgery may need to be rescheduled.    _x___ 1. Do not eat food (including mints, candies, chewing gum) after midnight the night before your procedure. You may drink water up to 2 hours before you are scheduled to arrive at the hospital for your procedure.  Do not drink anything within 2 hours of your scheduled arrival to the hospital.    __x__ 2. No Alcohol for 24 hours before or after surgery.   __x__3. No Smoking or e-cigarettes for 24 prior to surgery.  Do not use any chewable tobacco products for at least 6 hour prior to surgery   __x__ 4. Notify your doctor if there is any change in your medical condition     (cold, fever, infections).   __x__5. On the morning of surgery brush your teeth with toothpaste and water.  You may rinse your mouth with mouth wash if you wish.  Do not swallow any toothpaste or mouthwash.   Do not wear jewelry, make-up, hairpins, clips or nail polish.  Do not wear lotions, powders, deodorant, or perfumes.   Do not shave 48 hours prior to surgery.   Do not bring valuables to the hospital.    [facility] is not responsible for any belongings or valuables.               Contacts, dentures or bridgework may not be worn into surgery.  Leave your suitcase in the car. After surgery it may be brought to your room.  For patients admitted to the hospital, discharge time is determined by your treatment team.  Please read over the following fact sheets that you were given:   [facility]  Preparing for Surgery and or MRSA Information   _x___ Take anti-hypertensive listed below, cardiac, seizure, asthma, anti-reflux and psychiatric medicines. These include:  1. Metoprolol/Toprol   Do not take Lisinopril/Prinivil on the day of surgery.   _x___ Use CHG Soap or sage wipes as directed on instruction sheet   _x___ Follow recommendations from Cardiologist, Pulmonologist or PCP regarding stopping Aspirin, Coumadin, Plavix ,Eliquis, Effient, or Pradaxa, and Pletal.  _x___ Monday January 24, 2018 (7 days before surgery): Stop Anti-inflammatories such as Advil, Aleve, Ibuprofen, Motrin, Naproxen, Naprosyn, Goodies powders or aspirin products. OK to take Tylenol and Celebrex.   _x___ Stop supplements until after surgery.  But may continue Vitamin D, Vitamin B, and multivitamin.

## 2018-01-20 LAB — URINE CULTURE
Culture: NO GROWTH
Special Requests: NORMAL

## 2018-01-30 DIAGNOSIS — E669 Obesity, unspecified: Secondary | ICD-10-CM | POA: Diagnosis not present

## 2018-01-30 DIAGNOSIS — Z881 Allergy status to other antibiotic agents status: Secondary | ICD-10-CM | POA: Diagnosis not present

## 2018-01-30 DIAGNOSIS — Z6841 Body Mass Index (BMI) 40.0 and over, adult: Secondary | ICD-10-CM | POA: Diagnosis not present

## 2018-01-30 DIAGNOSIS — M170 Bilateral primary osteoarthritis of knee: Secondary | ICD-10-CM | POA: Diagnosis not present

## 2018-01-30 DIAGNOSIS — F17210 Nicotine dependence, cigarettes, uncomplicated: Secondary | ICD-10-CM | POA: Diagnosis not present

## 2018-01-30 DIAGNOSIS — Z888 Allergy status to other drugs, medicaments and biological substances status: Secondary | ICD-10-CM | POA: Diagnosis not present

## 2018-01-30 DIAGNOSIS — I10 Essential (primary) hypertension: Secondary | ICD-10-CM | POA: Diagnosis not present

## 2018-01-30 DIAGNOSIS — Z885 Allergy status to narcotic agent status: Secondary | ICD-10-CM | POA: Diagnosis not present

## 2018-01-30 DIAGNOSIS — Z880 Allergy status to penicillin: Secondary | ICD-10-CM | POA: Diagnosis not present

## 2018-01-30 LAB — ABO/RH: ABO/RH(D): B NEG

## 2018-01-30 MED ORDER — TRANEXAMIC ACID 1000 MG/10ML IV SOLN
1000.0000 mg | INTRAVENOUS | Status: DC
  Filled 2018-01-30: qty 10

## 2018-01-30 MED ORDER — CLINDAMYCIN PHOSPHATE 900 MG/50ML IV SOLN: 900.0000 mg | INTRAVENOUS | Status: DC

## 2018-01-31 ENCOUNTER — Other Ambulatory Visit: Payer: Self-pay

## 2018-01-31 ENCOUNTER — Encounter
Admission: RE | Disposition: A | Discharge status: Skilled Nursing Facility | Payer: Self-pay | Source: Home / Self Care | Attending: Orthopedic Surgery

## 2018-01-31 ENCOUNTER — Inpatient Hospital Stay: Payer: 59 | Admitting: Anesthesiology

## 2018-01-31 ENCOUNTER — Inpatient Hospital Stay: Payer: 59

## 2018-01-31 ENCOUNTER — Inpatient Hospital Stay
Admission: RE | Admit: 2018-01-31 | Discharge: 2018-02-02 | DRG: 470 | Disposition: A | Discharge status: Skilled Nursing Facility | Payer: 59 | Attending: Orthopedic Surgery | Admitting: Orthopedic Surgery

## 2018-01-31 ENCOUNTER — Encounter: Payer: Self-pay | Admitting: Orthopedic Surgery

## 2018-01-31 DIAGNOSIS — I10 Essential (primary) hypertension: Secondary | ICD-10-CM | POA: Diagnosis not present

## 2018-01-31 DIAGNOSIS — Z880 Allergy status to penicillin: Secondary | ICD-10-CM | POA: Diagnosis not present

## 2018-01-31 DIAGNOSIS — M170 Bilateral primary osteoarthritis of knee: Principal | ICD-10-CM | POA: Diagnosis present

## 2018-01-31 DIAGNOSIS — Z882 Allergy status to sulfonamides status: Secondary | ICD-10-CM

## 2018-01-31 DIAGNOSIS — F17210 Nicotine dependence, cigarettes, uncomplicated: Secondary | ICD-10-CM | POA: Diagnosis present

## 2018-01-31 DIAGNOSIS — R262 Difficulty in walking, not elsewhere classified: Secondary | ICD-10-CM | POA: Diagnosis not present

## 2018-01-31 DIAGNOSIS — Z96651 Presence of right artificial knee joint: Secondary | ICD-10-CM

## 2018-01-31 DIAGNOSIS — Z885 Allergy status to narcotic agent status: Secondary | ICD-10-CM | POA: Diagnosis not present

## 2018-01-31 DIAGNOSIS — Z888 Allergy status to other drugs, medicaments and biological substances status: Secondary | ICD-10-CM

## 2018-01-31 DIAGNOSIS — Z96659 Presence of unspecified artificial knee joint: Secondary | ICD-10-CM

## 2018-01-31 DIAGNOSIS — Z720 Tobacco use: Secondary | ICD-10-CM | POA: Diagnosis not present

## 2018-01-31 DIAGNOSIS — J309 Allergic rhinitis, unspecified: Secondary | ICD-10-CM | POA: Diagnosis not present

## 2018-01-31 DIAGNOSIS — Z471 Aftercare following joint replacement surgery: Secondary | ICD-10-CM | POA: Diagnosis not present

## 2018-01-31 DIAGNOSIS — E669 Obesity, unspecified: Secondary | ICD-10-CM | POA: Diagnosis present

## 2018-01-31 DIAGNOSIS — Z87892 Personal history of anaphylaxis: Secondary | ICD-10-CM

## 2018-01-31 DIAGNOSIS — Z881 Allergy status to other antibiotic agents status: Secondary | ICD-10-CM

## 2018-01-31 DIAGNOSIS — Z90710 Acquired absence of both cervix and uterus: Secondary | ICD-10-CM

## 2018-01-31 DIAGNOSIS — M6281 Muscle weakness (generalized): Secondary | ICD-10-CM | POA: Diagnosis not present

## 2018-01-31 DIAGNOSIS — Z6841 Body Mass Index (BMI) 40.0 and over, adult: Secondary | ICD-10-CM | POA: Diagnosis not present

## 2018-01-31 DIAGNOSIS — E785 Hyperlipidemia, unspecified: Secondary | ICD-10-CM | POA: Diagnosis not present

## 2018-01-31 DIAGNOSIS — M1712 Unilateral primary osteoarthritis, left knee: Secondary | ICD-10-CM | POA: Diagnosis not present

## 2018-01-31 DIAGNOSIS — M1711 Unilateral primary osteoarthritis, right knee: Secondary | ICD-10-CM | POA: Diagnosis not present

## 2018-01-31 DIAGNOSIS — Z87891 Personal history of nicotine dependence: Secondary | ICD-10-CM | POA: Diagnosis not present

## 2018-01-31 HISTORY — PX: KNEE ARTHROPLASTY: SHX992

## 2018-01-31 LAB — GLUCOSE, CAPILLARY
GLUCOSE-CAPILLARY: 113 mg/dL — AB (ref 70–99)
Glucose-Capillary: 151 mg/dL — ABNORMAL HIGH (ref 70–99)

## 2018-01-31 SURGERY — ARTHROPLASTY, KNEE, TOTAL, USING IMAGELESS COMPUTER-ASSISTED NAVIGATION
Anesthesia: Spinal | Site: Knee | Laterality: Right

## 2018-01-31 MED ORDER — PROPOFOL 500 MG/50ML IV EMUL
INTRAVENOUS | Status: AC
  Filled 2018-01-31: qty 50

## 2018-01-31 MED ORDER — BISACODYL 10 MG RE SUPP
10.0000 mg | Freq: Every day | RECTAL | Status: DC | PRN
  Filled 2018-01-31: qty 1

## 2018-01-31 MED ORDER — CLINDAMYCIN PHOSPHATE 600 MG/50ML IV SOLN
600.0000 mg | Freq: Four times a day (QID) | INTRAVENOUS | Status: AC
  Administered 2018-01-31 – 2018-02-01 (×4): 600 mg via INTRAVENOUS
  Filled 2018-01-31 (×4): qty 50

## 2018-01-31 MED ORDER — ONDANSETRON HCL 4 MG PO TABS: 4.0000 mg | ORAL_TABLET | Freq: Four times a day (QID) | ORAL | Status: DC | PRN

## 2018-01-31 MED ORDER — NEOMYCIN-POLYMYXIN B GU 40-200000 IR SOLN
Status: DC | PRN
  Administered 2018-01-31: 4 mL

## 2018-01-31 MED ORDER — ONDANSETRON HCL 4 MG/2ML IJ SOLN
INTRAMUSCULAR | Status: DC | PRN
  Administered 2018-01-31: 4 mg via INTRAVENOUS

## 2018-01-31 MED ORDER — ONDANSETRON HCL 4 MG/2ML IJ SOLN
INTRAMUSCULAR | Status: AC
  Filled 2018-01-31: qty 2

## 2018-01-31 MED ORDER — FENTANYL CITRATE (PF) 100 MCG/2ML IJ SOLN
INTRAMUSCULAR | Status: AC
  Administered 2018-01-31: 25 ug via INTRAVENOUS
  Filled 2018-01-31: qty 2

## 2018-01-31 MED ORDER — ACETAMINOPHEN 10 MG/ML IV SOLN
INTRAVENOUS | Status: AC
  Filled 2018-01-31: qty 100

## 2018-01-31 MED ORDER — TRANEXAMIC ACID 1000 MG/10ML IV SOLN
1000.0000 mg | Freq: Once | INTRAVENOUS | Status: AC
  Administered 2018-01-31: 1000 mg via INTRAVENOUS
  Filled 2018-01-31: qty 1100

## 2018-01-31 MED ORDER — DEXAMETHASONE SODIUM PHOSPHATE 10 MG/ML IJ SOLN
8.0000 mg | Freq: Once | INTRAMUSCULAR | Status: AC
  Administered 2018-01-31: 8 mg via INTRAVENOUS

## 2018-01-31 MED ORDER — BUPIVACAINE HCL (PF) 0.25 % IJ SOLN
INTRAMUSCULAR | Status: DC | PRN
  Administered 2018-01-31: 60 mL

## 2018-01-31 MED ORDER — LORAZEPAM 0.5 MG PO TABS: 0.5000 mg | ORAL_TABLET | Freq: Two times a day (BID) | ORAL | Status: DC | PRN

## 2018-01-31 MED ORDER — GABAPENTIN 300 MG PO CAPS
300.0000 mg | ORAL_CAPSULE | Freq: Every day | ORAL | Status: DC
  Administered 2018-01-31 – 2018-02-01 (×2): 300 mg via ORAL
  Filled 2018-01-31 (×2): qty 1

## 2018-01-31 MED ORDER — BUPIVACAINE HCL (PF) 0.25 % IJ SOLN
INTRAMUSCULAR | Status: AC
  Filled 2018-01-31: qty 30

## 2018-01-31 MED ORDER — SCOPOLAMINE 1 MG/3DAYS TD PT72
1.0000 | MEDICATED_PATCH | TRANSDERMAL | Status: DC
  Administered 2018-01-31: 1.5 mg via TRANSDERMAL

## 2018-01-31 MED ORDER — BUPIVACAINE LIPOSOME 1.3 % IJ SUSP
INTRAMUSCULAR | Status: AC
  Filled 2018-01-31: qty 20

## 2018-01-31 MED ORDER — DIPHENHYDRAMINE HCL 12.5 MG/5ML PO ELIX: 12.5000 mg | ORAL_SOLUTION | ORAL | Status: DC | PRN

## 2018-01-31 MED ORDER — METOPROLOL SUCCINATE ER 50 MG PO TB24
50.0000 mg | ORAL_TABLET | Freq: Every day | ORAL | Status: DC
  Administered 2018-02-01: 50 mg via ORAL
  Filled 2018-01-31 (×2): qty 1

## 2018-01-31 MED ORDER — FLEET ENEMA 7-19 GM/118ML RE ENEM: 1.0000 | ENEMA | Freq: Once | RECTAL | Status: DC | PRN

## 2018-01-31 MED ORDER — PHENOL 1.4 % MT LIQD
1.0000 | OROMUCOSAL | Status: DC | PRN
  Filled 2018-01-31: qty 177

## 2018-01-31 MED ORDER — ENOXAPARIN SODIUM 40 MG/0.4ML ~~LOC~~ SOLN
40.0000 mg | Freq: Two times a day (BID) | SUBCUTANEOUS | Status: DC
  Administered 2018-02-01 – 2018-02-02 (×3): 40 mg via SUBCUTANEOUS
  Filled 2018-01-31 (×3): qty 0.4

## 2018-01-31 MED ORDER — SODIUM CHLORIDE 0.9 % IJ SOLN
INTRAMUSCULAR | Status: AC
  Filled 2018-01-31: qty 100

## 2018-01-31 MED ORDER — CLINDAMYCIN PHOSPHATE 900 MG/50ML IV SOLN
INTRAVENOUS | Status: DC | PRN
  Administered 2018-01-31: 900 mg via INTRAVENOUS

## 2018-01-31 MED ORDER — ONDANSETRON HCL 4 MG/2ML IJ SOLN: 4.0000 mg | Freq: Once | INTRAMUSCULAR | Status: DC | PRN

## 2018-01-31 MED ORDER — CHLORHEXIDINE GLUCONATE 4 % EX LIQD: 60.0000 mL | Freq: Once | CUTANEOUS | Status: DC

## 2018-01-31 MED ORDER — MAGNESIUM HYDROXIDE 400 MG/5ML PO SUSP
30.0000 mL | Freq: Every day | ORAL | Status: DC
  Administered 2018-02-01 – 2018-02-02 (×2): 30 mL via ORAL
  Filled 2018-01-31 (×2): qty 30

## 2018-01-31 MED ORDER — LISINOPRIL 20 MG PO TABS
40.0000 mg | ORAL_TABLET | Freq: Every day | ORAL | Status: DC
  Administered 2018-01-31: 40 mg via ORAL
  Filled 2018-01-31: qty 2

## 2018-01-31 MED ORDER — BUPIVACAINE HCL (PF) 0.5 % IJ SOLN
INTRAMUSCULAR | Status: DC | PRN
  Administered 2018-01-31: 3 mL

## 2018-01-31 MED ORDER — METOCLOPRAMIDE HCL 10 MG PO TABS: 5.0000 mg | ORAL_TABLET | Freq: Three times a day (TID) | ORAL | Status: DC | PRN

## 2018-01-31 MED ORDER — METOPROLOL TARTRATE 5 MG/5ML IV SOLN
INTRAVENOUS | Status: AC
  Filled 2018-01-31: qty 5

## 2018-01-31 MED ORDER — CLINDAMYCIN PHOSPHATE 900 MG/50ML IV SOLN
INTRAVENOUS | Status: AC
  Filled 2018-01-31: qty 50

## 2018-01-31 MED ORDER — SODIUM CHLORIDE 0.9 % IV SOLN
INTRAVENOUS | Status: DC
  Administered 2018-01-31: 100 mL/h via INTRAVENOUS
  Administered 2018-02-01 (×2): via INTRAVENOUS

## 2018-01-31 MED ORDER — MIDAZOLAM HCL 2 MG/2ML IJ SOLN
INTRAMUSCULAR | Status: AC
  Filled 2018-01-31: qty 2

## 2018-01-31 MED ORDER — ACETAMINOPHEN 10 MG/ML IV SOLN
1000.0000 mg | Freq: Four times a day (QID) | INTRAVENOUS | Status: AC
  Administered 2018-01-31 – 2018-02-02 (×4): 1000 mg via INTRAVENOUS
  Filled 2018-01-31 (×4): qty 100

## 2018-01-31 MED ORDER — SODIUM CHLORIDE 0.9 % IV SOLN
INTRAVENOUS | Status: DC | PRN
  Administered 2018-01-31: 60 mL

## 2018-01-31 MED ORDER — GABAPENTIN 300 MG PO CAPS
300.0000 mg | ORAL_CAPSULE | Freq: Once | ORAL | Status: AC
  Administered 2018-01-31: 300 mg via ORAL

## 2018-01-31 MED ORDER — PENTAFLUOROPROP-TETRAFLUOROETH EX AERO
INHALATION_SPRAY | CUTANEOUS | Status: DC | PRN
  Filled 2018-01-31: qty 120

## 2018-01-31 MED ORDER — OXYCODONE HCL 5 MG PO TABS
5.0000 mg | ORAL_TABLET | ORAL | Status: DC | PRN
  Administered 2018-01-31 – 2018-02-02 (×2): 5 mg via ORAL
  Filled 2018-01-31 (×2): qty 1

## 2018-01-31 MED ORDER — METOPROLOL TARTRATE 5 MG/5ML IV SOLN
INTRAVENOUS | Status: DC | PRN
  Administered 2018-01-31: 2 mg via INTRAVENOUS
  Administered 2018-01-31 (×2): 1 mg via INTRAVENOUS

## 2018-01-31 MED ORDER — CLINDAMYCIN PHOSPHATE 900 MG/50ML IV SOLN: INTRAVENOUS | Status: DC | PRN

## 2018-01-31 MED ORDER — FERROUS SULFATE 325 (65 FE) MG PO TABS
325.0000 mg | ORAL_TABLET | Freq: Two times a day (BID) | ORAL | Status: DC
  Administered 2018-02-01 – 2018-02-02 (×3): 325 mg via ORAL
  Filled 2018-01-31 (×3): qty 1

## 2018-01-31 MED ORDER — TRAMADOL HCL 50 MG PO TABS
50.0000 mg | ORAL_TABLET | ORAL | Status: DC | PRN
  Administered 2018-01-31 – 2018-02-02 (×5): 100 mg via ORAL
  Filled 2018-01-31 (×2): qty 2
  Filled 2018-01-31: qty 1
  Filled 2018-01-31 (×3): qty 2

## 2018-01-31 MED ORDER — NEOMYCIN-POLYMYXIN B GU 40-200000 IR SOLN
Status: AC
Start: 2018-01-31 — End: ?
  Filled 2018-01-31: qty 20

## 2018-01-31 MED ORDER — FAMOTIDINE 20 MG PO TABS
20.0000 mg | ORAL_TABLET | Freq: Once | ORAL | Status: AC
  Administered 2018-01-31: 20 mg via ORAL

## 2018-01-31 MED ORDER — PANTOPRAZOLE SODIUM 40 MG PO TBEC
40.0000 mg | DELAYED_RELEASE_TABLET | Freq: Two times a day (BID) | ORAL | Status: DC
  Administered 2018-01-31 – 2018-02-02 (×4): 40 mg via ORAL
  Filled 2018-01-31 (×4): qty 1

## 2018-01-31 MED ORDER — FENTANYL CITRATE (PF) 100 MCG/2ML IJ SOLN
INTRAMUSCULAR | Status: AC
  Filled 2018-01-31: qty 2

## 2018-01-31 MED ORDER — METOCLOPRAMIDE HCL 5 MG/ML IJ SOLN: 5.0000 mg | Freq: Three times a day (TID) | INTRAMUSCULAR | Status: DC | PRN

## 2018-01-31 MED ORDER — CELECOXIB 200 MG PO CAPS
200.0000 mg | ORAL_CAPSULE | Freq: Two times a day (BID) | ORAL | Status: DC
  Administered 2018-01-31 – 2018-02-02 (×4): 200 mg via ORAL
  Filled 2018-01-31 (×4): qty 1

## 2018-01-31 MED ORDER — ONDANSETRON HCL 4 MG/2ML IJ SOLN: 4.0000 mg | Freq: Four times a day (QID) | INTRAMUSCULAR | Status: DC | PRN

## 2018-01-31 MED ORDER — OXYCODONE HCL 5 MG PO TABS
10.0000 mg | ORAL_TABLET | ORAL | Status: DC | PRN
  Administered 2018-02-01 (×2): 10 mg via ORAL
  Filled 2018-01-31 (×2): qty 2

## 2018-01-31 MED ORDER — BUPIVACAINE HCL (PF) 0.5 % IJ SOLN
INTRAMUSCULAR | Status: AC
  Filled 2018-01-31: qty 10

## 2018-01-31 MED ORDER — SCOPOLAMINE 1 MG/3DAYS TD PT72
MEDICATED_PATCH | TRANSDERMAL | Status: AC
  Filled 2018-01-31: qty 1

## 2018-01-31 MED ORDER — ACETAMINOPHEN 10 MG/ML IV SOLN
INTRAVENOUS | Status: DC | PRN
  Administered 2018-01-31: 1000 mg via INTRAVENOUS

## 2018-01-31 MED ORDER — TRANEXAMIC ACID 1000 MG/10ML IV SOLN
INTRAVENOUS | Status: DC | PRN
  Administered 2018-01-31: 1000 mg via INTRAVENOUS

## 2018-01-31 MED ORDER — CELECOXIB 200 MG PO CAPS
ORAL_CAPSULE | ORAL | Status: AC
  Administered 2018-01-31: 400 mg via ORAL
  Filled 2018-01-31: qty 2

## 2018-01-31 MED ORDER — CELECOXIB 200 MG PO CAPS
400.0000 mg | ORAL_CAPSULE | Freq: Once | ORAL | Status: AC
  Administered 2018-01-31: 400 mg via ORAL

## 2018-01-31 MED ORDER — SENNOSIDES-DOCUSATE SODIUM 8.6-50 MG PO TABS
1.0000 | ORAL_TABLET | Freq: Two times a day (BID) | ORAL | Status: DC
  Administered 2018-01-31 – 2018-02-02 (×4): 1 via ORAL
  Filled 2018-01-31 (×4): qty 1

## 2018-01-31 MED ORDER — PROPOFOL 500 MG/50ML IV EMUL
INTRAVENOUS | Status: DC | PRN
  Administered 2018-01-31: 40 ug/kg/min via INTRAVENOUS

## 2018-01-31 MED ORDER — METOCLOPRAMIDE HCL 10 MG PO TABS
10.0000 mg | ORAL_TABLET | Freq: Three times a day (TID) | ORAL | Status: DC
  Administered 2018-01-31 – 2018-02-02 (×7): 10 mg via ORAL
  Filled 2018-01-31 (×7): qty 1

## 2018-01-31 MED ORDER — MENTHOL 3 MG MT LOZG
1.0000 | LOZENGE | OROMUCOSAL | Status: DC | PRN
  Filled 2018-01-31: qty 9

## 2018-01-31 MED ORDER — FENTANYL CITRATE (PF) 100 MCG/2ML IJ SOLN
25.0000 ug | INTRAMUSCULAR | Status: DC | PRN
  Administered 2018-01-31 (×2): 25 ug via INTRAVENOUS

## 2018-01-31 MED ORDER — HYDROMORPHONE HCL 1 MG/ML IJ SOLN: 0.5000 mg | INTRAMUSCULAR | Status: DC | PRN

## 2018-01-31 MED ORDER — GABAPENTIN 300 MG PO CAPS
ORAL_CAPSULE | ORAL | Status: AC
  Administered 2018-01-31: 300 mg via ORAL
  Filled 2018-01-31: qty 1

## 2018-01-31 MED ORDER — FAMOTIDINE 20 MG PO TABS
ORAL_TABLET | ORAL | Status: AC
  Administered 2018-01-31: 20 mg via ORAL
  Filled 2018-01-31: qty 1

## 2018-01-31 MED ORDER — FENTANYL CITRATE (PF) 100 MCG/2ML IJ SOLN
INTRAMUSCULAR | Status: DC | PRN
  Administered 2018-01-31: 50 ug via INTRAVENOUS

## 2018-01-31 MED ORDER — ALUM & MAG HYDROXIDE-SIMETH 200-200-20 MG/5ML PO SUSP: 30.0000 mL | ORAL | Status: DC | PRN

## 2018-01-31 MED ORDER — NICOTINE 21 MG/24HR TD PT24
21.0000 mg | MEDICATED_PATCH | Freq: Every day | TRANSDERMAL | Status: DC
  Administered 2018-02-01 – 2018-02-02 (×2): 21 mg via TRANSDERMAL
  Filled 2018-01-31 (×2): qty 1

## 2018-01-31 MED ORDER — MIDAZOLAM HCL 5 MG/5ML IJ SOLN
INTRAMUSCULAR | Status: DC | PRN
  Administered 2018-01-31 (×2): 1 mg via INTRAVENOUS

## 2018-01-31 MED ORDER — ACETAMINOPHEN 325 MG PO TABS: 325.0000 mg | ORAL_TABLET | Freq: Four times a day (QID) | ORAL | Status: DC | PRN

## 2018-01-31 MED ORDER — DEXAMETHASONE SODIUM PHOSPHATE 10 MG/ML IJ SOLN
INTRAMUSCULAR | Status: AC
  Filled 2018-01-31: qty 1

## 2018-01-31 MED ORDER — LIDOCAINE HCL (PF) 2 % IJ SOLN
INTRAMUSCULAR | Status: AC
Start: 2018-01-31 — End: ?
  Filled 2018-01-31: qty 10

## 2018-01-31 MED ORDER — SODIUM CHLORIDE 0.9 % IV SOLN
INTRAVENOUS | Status: DC
  Administered 2018-01-31 (×2): via INTRAVENOUS

## 2018-01-31 MED ORDER — LACTATED RINGERS IV SOLN: INTRAVENOUS | Status: DC

## 2018-01-31 SURGICAL SUPPLY — 65 items
BATTERY INSTRU NAVIGATION (MISCELLANEOUS) ×12 IMPLANT
BLADE SAGITTAL 25.0X1.19X90 (BLADE) ×2 IMPLANT
BLADE SAGITTAL 25.0X1.19X90MM (BLADE) ×1
BLADE SAW 1/2 (BLADE) ×3 IMPLANT
BLADE SAW 70X12.5 (BLADE) IMPLANT
BONE CEMENT GENTAMICIN (Cement) ×6 IMPLANT
CANISTER SUCT 1200ML W/VALVE (MISCELLANEOUS) ×3 IMPLANT
CANISTER SUCT 3000ML PPV (MISCELLANEOUS) ×6 IMPLANT
CAPT KNEE TOTAL 3 ATTUNE ×3 IMPLANT
CEMENT BONE GENTAMICIN 40 (Cement) ×2 IMPLANT
COOLER POLAR GLACIER W/PUMP (MISCELLANEOUS) ×3 IMPLANT
CUFF TOURN 24 STER (MISCELLANEOUS) IMPLANT
CUFF TOURN 30 STER DUAL PORT (MISCELLANEOUS) ×3 IMPLANT
DRAPE SHEET LG 3/4 BI-LAMINATE (DRAPES) ×3 IMPLANT
DRSG DERMACEA 8X12 NADH (GAUZE/BANDAGES/DRESSINGS) ×3 IMPLANT
DRSG OPSITE POSTOP 4X14 (GAUZE/BANDAGES/DRESSINGS) ×3 IMPLANT
DRSG TEGADERM 4X4.75 (GAUZE/BANDAGES/DRESSINGS) ×3 IMPLANT
DURAPREP 26ML APPLICATOR (WOUND CARE) ×6 IMPLANT
ELECT CAUTERY BLADE 6.4 (BLADE) ×3 IMPLANT
ELECT REM PT RETURN 9FT ADLT (ELECTROSURGICAL) ×3
ELECTRODE REM PT RTRN 9FT ADLT (ELECTROSURGICAL) ×1 IMPLANT
EX-PIN ORTHOLOCK NAV 4X150 (PIN) ×6 IMPLANT
GLOVE BIOGEL M STRL SZ7.5 (GLOVE) ×6 IMPLANT
GLOVE BIOGEL PI IND STRL 9 (GLOVE) ×1 IMPLANT
GLOVE BIOGEL PI INDICATOR 9 (GLOVE) ×2
GLOVE INDICATOR 8.0 STRL GRN (GLOVE) ×3 IMPLANT
GLOVE SURG SYN 9.0  PF PI (GLOVE) ×2
GLOVE SURG SYN 9.0 PF PI (GLOVE) ×1 IMPLANT
GOWN STRL REUS W/ TWL LRG LVL3 (GOWN DISPOSABLE) ×2 IMPLANT
GOWN STRL REUS W/TWL 2XL LVL3 (GOWN DISPOSABLE) ×3 IMPLANT
GOWN STRL REUS W/TWL LRG LVL3 (GOWN DISPOSABLE) ×4
HEMOVAC 400CC 10FR (MISCELLANEOUS) ×3 IMPLANT
HOLDER FOLEY CATH W/STRAP (MISCELLANEOUS) ×3 IMPLANT
HOOD PEEL AWAY FLYTE STAYCOOL (MISCELLANEOUS) ×6 IMPLANT
KIT TURNOVER KIT A (KITS) ×3 IMPLANT
KNIFE SCULPS 14X20 (INSTRUMENTS) ×3 IMPLANT
LABEL OR SOLS (LABEL) ×3 IMPLANT
NDL SAFETY ECLIPSE 18X1.5 (NEEDLE) ×1 IMPLANT
NEEDLE HYPO 18GX1.5 SHARP (NEEDLE) ×2
NEEDLE SPNL 20GX3.5 QUINCKE YW (NEEDLE) ×6 IMPLANT
NS IRRIG 500ML POUR BTL (IV SOLUTION) ×3 IMPLANT
PACK TOTAL KNEE (MISCELLANEOUS) ×3 IMPLANT
PAD WRAPON POLAR KNEE (MISCELLANEOUS) ×1 IMPLANT
PIN FIXATION 1/8DIA X 3INL (PIN) ×9 IMPLANT
PULSAVAC PLUS IRRIG FAN TIP (DISPOSABLE) ×3
SOL .9 NS 3000ML IRR  AL (IV SOLUTION) ×2
SOL .9 NS 3000ML IRR UROMATIC (IV SOLUTION) ×1 IMPLANT
SOL PREP PVP 2OZ (MISCELLANEOUS) ×3
SOLUTION PREP PVP 2OZ (MISCELLANEOUS) ×1 IMPLANT
SPONGE DRAIN TRACH 4X4 STRL 2S (GAUZE/BANDAGES/DRESSINGS) ×3 IMPLANT
STAPLER SKIN PROX 35W (STAPLE) ×3 IMPLANT
STOCKINETTE IMPERV 14X48 (MISCELLANEOUS) ×3 IMPLANT
STRAP TIBIA SHORT (MISCELLANEOUS) ×3 IMPLANT
SUCTION FRAZIER HANDLE 10FR (MISCELLANEOUS) ×2
SUCTION TUBE FRAZIER 10FR DISP (MISCELLANEOUS) ×1 IMPLANT
SUT VIC AB 0 CT1 36 (SUTURE) ×3 IMPLANT
SUT VIC AB 1 CT1 36 (SUTURE) ×6 IMPLANT
SUT VIC AB 2-0 CT2 27 (SUTURE) ×3 IMPLANT
SYR 20CC LL (SYRINGE) ×3 IMPLANT
SYR 30ML LL (SYRINGE) ×6 IMPLANT
TIP FAN IRRIG PULSAVAC PLUS (DISPOSABLE) ×1 IMPLANT
TOWEL OR 17X26 4PK STRL BLUE (TOWEL DISPOSABLE) ×3 IMPLANT
TOWER CARTRIDGE SMART MIX (DISPOSABLE) ×3 IMPLANT
TRAY FOLEY MTR SLVR 16FR STAT (SET/KITS/TRAYS/PACK) ×3 IMPLANT
WRAPON POLAR PAD KNEE (MISCELLANEOUS) ×3

## 2018-01-31 NOTE — Transfer of Care (Signed)
Immediate Anesthesia Transfer of Care Note  Patient: Ruth Higgins  Procedure(s) Performed: COMPUTER ASSISTED TOTAL KNEE ARTHROPLASTY (Right Knee)  Patient Location: PACU  Anesthesia Type:Spinal  Level of Consciousness: oriented, drowsy and patient cooperative  Airway & Oxygen Therapy: Patient Spontanous Breathing and Patient connected to nasal cannula oxygen  Post-op Assessment: Report given to RN and Post -op Vital signs reviewed and stable  Post vital signs: Reviewed and stable  Last Vitals:  Vitals Value Taken Time  BP 129/70 01/31/2018  7:26 PM  Temp    Pulse 64 01/31/2018  7:28 PM  Resp 23 01/31/2018  7:28 PM  SpO2 96 % 01/31/2018  7:28 PM  Vitals shown include unvalidated device data.  Last Pain:  Vitals:   01/31/18 1334  TempSrc: Temporal  PainSc:          Complications: No apparent anesthesia complications

## 2018-01-31 NOTE — Op Note (Signed)
OPERATIVE NOTE  DATE OF SURGERY:  01/31/2018  PATIENT NAME:  Ruth Higgins   DOB: 11-11-1955  MRN: [id number]  PRE-OPERATIVE DIAGNOSIS: Degenerative arthrosis of the right knee, primary  POST-OPERATIVE DIAGNOSIS:  Same  PROCEDURE:  Right total knee arthroplasty using computer-assisted navigation  SURGEON:  Marciano Sequin. M.D.  ASSISTANT:  Vance Peper, PA (present and scrubbed throughout the case, critical for assistance with exposure, retraction, instrumentation, and closure)  ANESTHESIA: spinal  ESTIMATED BLOOD LOSS: 50 mL  FLUIDS REPLACED: 1500 mL of crystalloid  TOURNIQUET TIME: 73 minutes  DRAINS: 2 medium Hemovac drains  SOFT TISSUE RELEASES: Anterior cruciate ligament, posterior cruciate ligament, deep medial collateral ligament, patellofemoral ligament  IMPLANTS UTILIZED: DePuy Attune size 5N posterior stabilized femoral component (cemented), size 4 rotating platform tibial component (cemented), 35 mm medialized dome patella (cemented), and a 5 mm stabilized rotating platform polyethylene insert.  INDICATIONS FOR SURGERY: Ruth Higgins is a 62 y.o. year old female with a long history of progressive knee pain. X-rays demonstrated severe degenerative changes in tricompartmental fashion. The patient had not seen any significant improvement despite conservative nonsurgical intervention. After discussion of the risks and benefits of surgical intervention, the patient expressed understanding of the risks benefits and agree with plans for total knee arthroplasty.   The risks, benefits, and alternatives were discussed at length including but not limited to the risks of infection, bleeding, nerve injury, stiffness, blood clots, the need for revision surgery, cardiopulmonary complications, among others, and they were willing to proceed.  PROCEDURE IN DETAIL: The patient was brought into the operating room and, after adequate spinal anesthesia was achieved, a tourniquet was  placed on the patient's upper thigh. The patient's knee and leg were cleaned and prepped with alcohol and DuraPrep and draped in the usual sterile fashion. A "timeout" was performed as per usual protocol. The lower extremity was exsanguinated using an Esmarch, and the tourniquet was inflated to 300 mmHg. An anterior longitudinal incision was made followed by a standard mid vastus approach. The deep fibers of the medial collateral ligament were elevated in a subperiosteal fashion off of the medial flare of the tibia so as to maintain a continuous soft tissue sleeve. The patella was subluxed laterally and the patellofemoral ligament was incised. Inspection of the knee demonstrated severe degenerative changes with full-thickness loss of articular cartilage. Osteophytes were debrided using a rongeur. Anterior and posterior cruciate ligaments were excised. Two 4.0 mm Schanz pins were inserted in the femur and into the tibia for attachment of the array of trackers used for computer-assisted navigation. Hip center was identified using a circumduction technique. Distal landmarks were mapped using the computer. The distal femur and proximal tibia were mapped using the computer. The distal femoral cutting guide was positioned using computer-assisted navigation so as to achieve a 5 distal valgus cut. The femur was sized and it was felt that a size 5N femoral component was appropriate. A size 5 femoral cutting guide was positioned and the anterior cut was performed and verified using the computer. This was followed by completion of the posterior and chamfer cuts. Femoral cutting guide for the central box was then positioned in the center box cut was performed.  Attention was then directed to the proximal tibia. Medial and lateral menisci were excised. The extramedullary tibial cutting guide was positioned using computer-assisted navigation so as to achieve a 0 varus-valgus alignment and 3 posterior slope. The cut was  performed and verified using the computer. The proximal tibia  was sized and it was felt that a size 4 tibial tray was appropriate. Tibial and femoral trials were inserted followed by insertion of a 5 mm polyethylene insert. This allowed for excellent mediolateral soft tissue balancing both in flexion and in full extension. Finally, the patella was cut and prepared so as to accommodate a 35 mm medialized dome patella. A patella trial was placed and the knee was placed through a range of motion with excellent patellar tracking appreciated. The femoral trial was removed after debridement of posterior osteophytes. The central post-hole for the tibial component was reamed followed by insertion of a keel punch. Tibial trials were then removed. Cut surfaces of bone were irrigated with copious amounts of normal saline with antibiotic solution using pulsatile lavage and then suctioned dry. Polymethylmethacrylate cement with gentamicin was prepared in the usual fashion using a vacuum mixer. Cement was applied to the cut surface of the proximal tibia as well as along the undersurface of a size 4 rotating platform tibial component. Tibial component was positioned and impacted into place. Excess cement was removed using [employer] fast streamer. Cement was then applied to the cut surfaces of the femur as well as along the posterior flanges of the size 5N femoral component. The femoral component was positioned and impacted into place. Excess cement was removed using [employer] fast streamer. A 5 mm polyethylene trial was inserted and the knee was brought into full extension with steady axial compression applied. Finally, cement was applied to the backside of a 35 mm medialized dome patella and the patellar component was positioned and patellar clamp applied. Excess cement was removed using [employer] fast streamer. After adequate curing of the cement, the tourniquet was deflated after a total tourniquet time of 73 minutes. Hemostasis was achieved using  electrocautery. The knee was irrigated with copious amounts of normal saline with antibiotic solution using pulsatile lavage and then suctioned dry. 20 mL of 1.3% Exparel and 60 mL of 0.25% Marcaine in 40 mL of normal saline was injected along the posterior capsule, medial and lateral gutters, and along the arthrotomy site. A 5 mm stabilized rotating platform polyethylene insert was inserted and the knee was placed through a range of motion with excellent mediolateral soft tissue balancing appreciated and excellent patellar tracking noted. 2 medium drains were placed in the wound bed and brought out through separate stab incisions. The medial parapatellar portion of the incision was reapproximated using interrupted sutures of #1 Vicryl. Subcutaneous tissue was approximated in layers using first #0 Vicryl followed #2-0 Vicryl. The skin was approximated with skin staples. A sterile dressing was applied.  The patient tolerated the procedure well and was transported to the recovery room in stable condition.    Makenly Larabee P. Holley Bouche., M.D.

## 2018-01-31 NOTE — Anesthesia Post-op Follow-up Note (Signed)
Anesthesia QCDR form completed.        

## 2018-01-31 NOTE — Progress Notes (Signed)
Anticoagulation monitoring(Lovenox):  62 yo female ordered Lovenox 30 mg Q12h  There were no vitals filed for this visit. BMI 42.23    Lab Results  Component Value Date   CREATININE 0.81 01/19/2018   CREATININE 0.95 12/22/2016   CREATININE 0.94 05/19/2016   Estimated Creatinine Clearance: 78.7 mL/min (by C-G formula based on SCr of 0.81 mg/dL). Hemoglobin & Hematocrit     Component Value Date/Time   HGB 14.7 01/19/2018 0831   HGB 15.2 02/25/2013 1746   HCT 44.2 01/19/2018 0831   HCT 44.6 02/25/2013 1746     Per Protocol for Patient with estCrcl > 30 ml/min and BMI > 40, will transition to Lovenox 40 mg Q12h.

## 2018-01-31 NOTE — Anesthesia Procedure Notes (Signed)
Date/Time: 01/31/2018 4:43 PM Performed by: Johnna Acosta, CRNA Pre-anesthesia Checklist: Patient identified, Emergency Drugs available, Suction available, Patient being monitored and Timeout performed Patient Re-evaluated:Patient Re-evaluated prior to induction Oxygen Delivery Method: Simple face mask Preoxygenation: Pre-oxygenation with 100% oxygen

## 2018-01-31 NOTE — Anesthesia Preprocedure Evaluation (Addendum)
Anesthesia Evaluation  Patient identified by MRN, date of birth, ID band Patient awake    Reviewed: Allergy & Precautions, NPO status , Patient's Chart, lab work & pertinent test results  History of Anesthesia Complications (+) PONV  Airway Mallampati: III       Dental   Pulmonary neg sleep apnea, neg COPD, Current Smoker,           Cardiovascular hypertension, Pt. on medications and Pt. on home beta blockers (-) Past MI and (-) CHF (-) dysrhythmias (-) Valvular Problems/Murmurs     Neuro/Psych neg Seizures Anxiety Depression    GI/Hepatic Neg liver ROS, neg GERD  ,  Endo/Other  neg diabetes  Renal/GU negative Renal ROS     Musculoskeletal   Abdominal   Peds  Hematology   Anesthesia Other Findings   Reproductive/Obstetrics                            Anesthesia Physical Anesthesia Plan  ASA: III  Anesthesia Plan: Spinal   Post-op Pain Management:    Induction:   PONV Risk Score and Plan:   Airway Management Planned:   Additional Equipment:   Intra-op Plan:   Post-operative Plan:   Informed Consent: I have reviewed the patients History and Physical, chart, labs and discussed the procedure including the risks, benefits and alternatives for the proposed anesthesia with the patient or authorized representative who has indicated his/her understanding and acceptance.     Plan Discussed with:   Anesthesia Plan Comments:         Anesthesia Quick Evaluation

## 2018-01-31 NOTE — Anesthesia Procedure Notes (Signed)
Spinal  Patient location during procedure: OR Start time: 01/31/2018 4:35 PM End time: 01/31/2018 4:41 PM Staffing Anesthesiologist: Gunnar Fusi, MD Resident/CRNA: Johnna Acosta, CRNA Performed: anesthesiologist and resident/CRNA  Preanesthetic Checklist Completed: patient identified, site marked, surgical consent, pre-op evaluation, timeout performed, IV checked, risks and benefits discussed and monitors and equipment checked Spinal Block Patient position: sitting Prep: ChloraPrep Patient monitoring: heart rate, continuous pulse ox, blood pressure and cardiac monitor Approach: midline Location: L3-4 Injection technique: single-shot Needle Needle type: Whitacre and Introducer  Needle gauge: 24 G Needle length: 9 cm Assessment Sensory level: T10 Additional Notes Negative paresthesia. Negative blood return. Positive free-flowing CSF. Expiration date of kit checked and confirmed. Patient tolerated procedure well, without complications.

## 2018-01-31 NOTE — H&P (Signed)
The patient has been re-examined, and the chart reviewed, and there have been no interval changes to the documented history and physical.    The risks, benefits, and alternatives have been discussed at length. The patient expressed understanding of the risks benefits and agreed with plans for surgical intervention.  Aujanae Mccullum P. Chaia Ikard, Jr. M.D.    

## 2018-02-01 ENCOUNTER — Encounter: Payer: Self-pay | Admitting: Orthopedic Surgery

## 2018-02-01 ENCOUNTER — Encounter
Admission: RE | Admit: 2018-02-01 | Discharge: 2018-02-01 | Disposition: A | Discharge status: Home / Self Care | Payer: 59 | Source: Ambulatory Visit | Attending: Internal Medicine | Admitting: Internal Medicine

## 2018-02-01 ENCOUNTER — Other Ambulatory Visit: Payer: Self-pay

## 2018-02-01 MED ORDER — FLUCONAZOLE 50 MG PO TABS
250.0000 mg | ORAL_TABLET | Freq: Once | ORAL | Status: AC
  Administered 2018-02-01: 250 mg via ORAL
  Filled 2018-02-01: qty 1

## 2018-02-01 MED ORDER — OXYCODONE HCL 5 MG PO TABS: 5.0000 mg | ORAL_TABLET | ORAL | 0 refills | Status: DC | PRN

## 2018-02-01 MED ORDER — ENOXAPARIN SODIUM 40 MG/0.4ML ~~LOC~~ SOLN: 40.0000 mg | SUBCUTANEOUS | 0 refills | Status: DC

## 2018-02-01 MED ORDER — SODIUM CHLORIDE 0.9 % IV BOLUS
500.0000 mL | Freq: Once | INTRAVENOUS | Status: AC
  Administered 2018-02-01: 500 mL via INTRAVENOUS

## 2018-02-01 MED ORDER — TRAMADOL HCL 50 MG PO TABS: 50.0000 mg | ORAL_TABLET | Freq: Four times a day (QID) | ORAL | 0 refills | Status: DC | PRN

## 2018-02-01 NOTE — Progress Notes (Signed)
   Subjective: 1 Day Post-Op Procedure(s) (LRB): COMPUTER ASSISTED TOTAL KNEE ARTHROPLASTY (Right) Patient reports pain as mild.   Patient is well, and has had no acute complaints or problems We will start therapy today.  Plan is to go Home after hospital stay. no nausea and no vomiting Patient denies any chest pains or shortness of breath. Patient states that she has sit at the edge of the bed and dangle her leg during the night Slept most the night. Voicing no complaints Denies any numbness, tingling or burning sensation to the lower extremity  Objective: Vital signs in last 24 hours: Temp:  [96.9 F (36.1 C)-98.7 F (37.1 C)] 98.7 F (37.1 C) (07/30 0343) Pulse Rate:  [46-79] 46 (07/30 0343) Resp:  [12-25] 18 (07/30 0343) BP: (103-154)/(56-82) 103/58 (07/30 0343) SpO2:  [90 %-98 %] 94 % (07/30 0343) Weight:  [99.8 kg (220 lb)] 99.8 kg (220 lb) (07/30 0517) Heels are non tender and elevated off the bed using rolled towels along with bone foam under operative heel  Intake/Output from previous day: 07/29 0701 - 07/30 0700 In: 3157.5 [P.O.:510; I.V.:2497.5; IV Piggyback:150] Out: 1100 [Urine:850; Drains:200; Blood:50] Intake/Output this shift: No intake/output data recorded.  No results for input(s): HGB in the last 72 hours. No results for input(s): WBC, RBC, HCT, PLT in the last 72 hours. No results for input(s): NA, K, CL, CO2, BUN, CREATININE, GLUCOSE, CALCIUM in the last 72 hours. No results for input(s): LABPT, INR in the last 72 hours.  EXAM General - Patient is Alert, Appropriate and Oriented Extremity - Neurologically intact Neurovascular intact Sensation intact distally Intact pulses distally Dorsiflexion/Plantar flexion intact Compartment soft Dressing - dressing C/D/I Motor Function - intact, moving foot and toes well on exam.  Able to do straight leg raise on her own  Past Medical History:  Diagnosis Date  . Arthritis   . Family history of adverse  reaction to anesthesia    brother hard to wake up after surgery  . Hypertension   . PONV (postoperative nausea and vomiting) 1980s  . Pre-diabetes     Assessment/Plan: 1 Day Post-Op Procedure(s) (LRB): COMPUTER ASSISTED TOTAL KNEE ARTHROPLASTY (Right) Active Problems:   S/P total knee arthroplasty  Estimated body mass index is 41.57 kg/m as calculated from the following:   Height as of this encounter: 5\' 1"  (1.549 m).   Weight as of this encounter: 99.8 kg (220 lb). Advance diet Up with therapy D/C IV fluids Plan for discharge tomorrow Discharge home with home health  Labs: None DVT Prophylaxis - Lovenox, Foot Pumps and TED hose Weight-Bearing as tolerated to right leg Begin working on bowel movement  Jon R. [place] 02/01/2018, 7:08 AM

## 2018-02-01 NOTE — Evaluation (Signed)
Physical Therapy Evaluation Patient Details Name: Ruth Higgins MRN: [id number] DOB: 1956/06/09 Today's Date: 02/01/2018   History of Present Illness  pt presents to hospital on 01/31/18 for an elective R TKA  performed by Dr. Marry Guan. Pt has a past medical history that includes HTN, anxiety, depression, and tobacco use    Clinical Impression  Pt is a pleasant 62 year old female who was admitted for a R TKR performed by Dr. Marry Guan. Pt performs bed mobility, transfers, and ambulation with CGA to min assist and a RW. Pt demonstrates deficits with strength, ROM, and mobility. Pt demonstrates ability to perform 10 SLRs with independence, therefore does not require KI for mobility. Pt sensation WNL on B LE. Pt  able to perform AROM of all LE motions including extension of toes. Pt states that pain was 1/10 at the onset of therapy and remained controlled throughout. Pt was transferred with min assistance sit>stand at Rw. Pt amb 3' using walker transferred and stand>sit in chair at bedside. Pt AAROM of R knee 0-75 degrees. Pt was instructed verbally and through demonstration HEP. Pt tolerated treatment well. Pt would benefit from continued skilled therapy to improve toward PLOF. PT will continue to work with pt BID during admission. At this time PT recommends d/c to SNF.     Follow Up Recommendations SNF    Equipment Recommendations  Rolling walker with 5" wheels;3in1 (PT)    Recommendations for Other Services       Precautions / Restrictions Precautions Precautions: None Restrictions Weight Bearing Restrictions: Yes RLE Weight Bearing: Weight bearing as tolerated      Mobility  Bed Mobility Overal bed mobility: Modified Independent             General bed mobility comments: requires increased upper extremity support and time to perform  Transfers Overall transfer level: Needs assistance Equipment used: Rolling walker (2 wheeled) Transfers: Sit to/from Stand Sit to Stand: Min  assist         General transfer comment: pt min assist sit<>stand at RW. Pt demonstrates safe use of RW and upper extremities. Able to maintain upright posture with CGA once standing,  Ambulation/Gait Ambulation/Gait assistance: Min guard Gait Distance (Feet): 3 Feet Assistive device: Rolling walker (2 wheeled) Gait Pattern/deviations: Shuffle     General Gait Details: pt amb 3' to chair beside bed with RW. Pt requires CGA and continued verbal cuing for safe use of DME  Stairs            Wheelchair Mobility    Modified Rankin (Stroke Patients Only)       Balance Overall balance assessment: Needs assistance   Sitting balance-Leahy Scale: Good Sitting balance - Comments: able to sit EOB without UE support and B feet flat on floor without gross LOB     Standing balance-Leahy Scale: Fair Standing balance comment: able to stand with UE support on RW and no gross LOB.                             Pertinent Vitals/Pain Pain Assessment: 0-10 Pain Score: 1  Pain Location: R knee Pain Descriptors / Indicators: Sharp Pain Intervention(s): Monitored during session;Premedicated before session    Home Living Family/patient expects to be discharged to:: Private residence Living Arrangements: Spouse/significant other;Parent Available Help at Discharge: Other (Comment)(pt reports no assistance at discharge) Type of Home: House Home Access: Ramped entrance     Home Layout: Two level(unable to live  on first floor) Home Equipment: None      Prior Function Level of Independence: Independent         Comments: pt states that she was independent at [facility] without an assistive device. 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 Pt reports that she has not had any falls.     Hand Dominance        Extremity/Trunk Assessment   Upper Extremity Assessment Upper Extremity Assessment: Overall WFL for tasks assessed(Grossly at  least 4+/5)    Lower Extremity Assessment Lower Extremity Assessment: RLE deficits/detail;LLE deficits/detail RLE Deficits / Details: Unable to fully assess due to pain, grossly at least 4/5 RLE Sensation: WNL LLE Deficits / Details: Grossly at least 4/5. Pt reports pain in L knee that is chronic LLE Sensation: WNL       Communication   Communication: No difficulties  Cognition Arousal/Alertness: Awake/alert Behavior During Therapy: WFL for tasks assessed/performed Overall Cognitive Status: Within Functional Limits for tasks assessed                                        General Comments      Exercises Total Joint Exercises Goniometric ROM: aarom of R LE 0-75 degrees Other Exercises Other Exercises: pt instructed in and tolerated ther-ex well to R LE including ankle pumps, quad sets, glut sets, SLR, hip abd, SAQ, and heel slides x10 each   Assessment/Plan    PT Assessment Patient needs continued PT services  PT Problem List Decreased strength;Decreased range of motion;Decreased activity tolerance;Decreased balance;Decreased mobility;Decreased coordination;Decreased knowledge of use of DME;Decreased safety awareness;Decreased knowledge of precautions;Pain       PT Treatment Interventions Gait training;DME instruction;Stair training;Functional mobility training;Therapeutic activities;Therapeutic exercise;Balance training;Neuromuscular re-education;Patient/family education    PT Goals (Current goals can be found in the Care Plan section)  Acute Rehab PT Goals Patient Stated Goal: to get back to walking PT Goal Formulation: With patient Time For Goal Achievement: 02/15/18 Potential to Achieve Goals: Good    Frequency BID   Barriers to discharge        Co-evaluation               AM-PAC PT "6 Clicks" Daily Activity  Outcome Measure Difficulty turning over in bed (including adjusting bedclothes, sheets and blankets)?: A Little Difficulty moving  from lying on back to sitting on the side of the bed? : A Little Difficulty sitting down on and standing up from a chair with arms (e.g., wheelchair, bedside commode, etc,.)?: Unable Help needed moving to and from a bed to chair (including a wheelchair)?: A Little Help needed walking in hospital room?: A Little Help needed climbing 3-5 steps with a railing? : A Lot 6 Click Score: 15    End of Session Equipment Utilized During Treatment: Gait belt Activity Tolerance: Patient tolerated treatment well Patient left: in chair;with call bell/phone within reach;with chair alarm set;with family/visitor present;with SCD's reapplied   PT Visit Diagnosis: Unsteadiness on feet (R26.81);Other abnormalities of gait and mobility (R26.89);Muscle weakness (generalized) (M62.81);Difficulty in walking, not elsewhere classified (R26.2);Pain Pain - Right/Left: Right Pain - part of body: Knee    Time: 1010-1048 PT Time Calculation (min) (ACUTE ONLY): 38 min   Charges:              Yolonda Kida, SPT   Tamaiya Bump 02/01/2018, 11:15 AM

## 2018-02-01 NOTE — Discharge Summary (Signed)
Physician Discharge Summary  Patient ID: Ruth Higgins MRN: [id number] DOB/AGE: 05-Dec-1955 62 y.o.  Admit date: 01/31/2018 Discharge date: 02/02/2018  Admission Diagnoses:  PRIMARY OSTEOARTHRITIS OF RIGHT KNEE   Discharge Diagnoses: Patient Active Problem List   Diagnosis Date Noted  . S/P total knee arthroplasty 01/31/2018  . Primary osteoarthritis of both knees 11/19/2017  . Personal history of tobacco use, presenting hazards to health 12/01/2016  . Encounter for screening colonoscopy 12/06/2015  . Abdominal mass 11/23/2015  . Neck mass 11/23/2015  . Hyperglycemia 05/20/2015  . Anxiety, mild 03/15/2015  . Allergic rhinitis 03/15/2015  . Depression, major, single episode, mild ([place]) 03/15/2015  . Essential (primary) hypertension 03/15/2015  . Fam hx-polycystic kidney 03/15/2015  . HLD (hyperlipidemia) 03/15/2015  . Cannot sleep 03/15/2015  . Menopause 03/15/2015  . Obesity 03/15/2015  . Arthritis of knee, degenerative 03/15/2015  . Arthritis, degenerative 03/15/2015  . Current tobacco use 03/15/2015    Past Medical History:  Diagnosis Date  . Arthritis   . Family history of adverse reaction to anesthesia    brother hard to wake up after surgery  . Hypertension   . PONV (postoperative nausea and vomiting) 1980s  . Pre-diabetes      Transfusion: No transfusions during this admission   Consultants (if any):   Discharged Condition: Improved  Hospital Course: Ruth Higgins is an 62 y.o. female who was admitted 01/31/2018 with a diagnosis of degenerative arthrosis right knee and went to the operating room on 01/31/2018 and underwent the above named procedures.    Surgeries:Procedure(s): COMPUTER ASSISTED TOTAL KNEE ARTHROPLASTY on 01/31/2018  PRE-OPERATIVE DIAGNOSIS: Degenerative arthrosis of the right knee, primary  POST-OPERATIVE DIAGNOSIS:  Same  PROCEDURE:  Right total knee arthroplasty using computer-assisted navigation  SURGEON:  Marciano Sequin. M.D.  ASSISTANT:  Vance Peper, PA (present and scrubbed throughout the case, critical for assistance with exposure, retraction, instrumentation, and closure)  ANESTHESIA: spinal  ESTIMATED BLOOD LOSS: 50 mL  FLUIDS REPLACED: 1500 mL of crystalloid  TOURNIQUET TIME: 73 minutes  DRAINS: 2 medium Hemovac drains  SOFT TISSUE RELEASES: Anterior cruciate ligament, posterior cruciate ligament, deep medial collateral ligament, patellofemoral ligament  IMPLANTS UTILIZED: DePuy Attune size 5N posterior stabilized femoral component (cemented), size 4 rotating platform tibial component (cemented), 35 mm medialized dome patella (cemented), and a 5 mm stabilized rotating platform polyethylene insert.  INDICATIONS FOR SURGERY: Ruth Higgins is a 62 y.o. year old female with a long history of progressive knee pain. X-rays demonstrated severe degenerative changes in tricompartmental fashion. The patient had not seen any significant improvement despite conservative nonsurgical intervention. After discussion of the risks and benefits of surgical intervention, the patient expressed understanding of the risks benefits and agree with plans for total knee arthroplasty.   The risks, benefits, and alternatives were discussed at length including but not limited to the risks of infection, bleeding, nerve injury, stiffness, blood clots, the need for revision surgery, cardiopulmonary complications, among others, and they were willing to proceed.   Patient tolerated the surgery well. No complications .Patient was taken to PACU where she was stabilized and then transferred to the orthopedic floor.  Patient started on Lovenox 40 mg q 12 hrs. Foot pumps applied bilaterally at 80 mm hgb. Heels elevated off bed with rolled towels. No evidence of DVT. Calves non tender. Negative Homan. Physical therapy started on day #1 for gait training and transfer with OT starting on  day #1 for ADL and assisted devices.  Patient  has done well with therapy. Ambulated 100 feet upon being discharged.    Patient's IV And Foley were discontinued on day #1 with Hemovac being discontinued on day #2. Dressing was changed on day 2 prior to patient being discharged   She was given perioperative antibiotics:  Anti-infectives (From admission, onward)   Start     Dose/Rate Route Frequency Ordered Stop   02/01/18 0800  fluconazole (DIFLUCAN) tablet 250 mg     250 mg Oral  Once 02/01/18 0753     01/31/18 2300  clindamycin (CLEOCIN) IVPB 600 mg     600 mg 100 mL/hr over 30 Minutes Intravenous Every 6 hours 01/31/18 2049 02/01/18 2259   01/31/18 0600  clindamycin (CLEOCIN) IVPB 900 mg  Status:  Discontinued     900 mg 100 mL/hr over 30 Minutes Intravenous On call to O.R. 01/30/18 2140 01/31/18 1324    .  She was fitted with AV 1 compression foot pump devices, instructed on heel pumps, early ambulation, and fitted with TED stockings bilaterally for DVT prophylaxis.  She benefited maximally from the hospital stay and there were no complications.    Recent vital signs:  Vitals:   02/01/18 0100 02/01/18 0343  BP: 133/75 (!) 103/58  Pulse: (!) 52 (!) 46  Resp: 20 18  Temp: (!) 97.4 F (36.3 C) 98.7 F (37.1 C)  SpO2: 92% 94%    Recent laboratory studies:  Lab Results  Component Value Date   HGB 14.7 01/19/2018   HGB 14.5 08/01/2013   HGB 15.2 02/25/2013   Lab Results  Component Value Date   WBC 8.6 01/19/2018   PLT 194 01/19/2018   Lab Results  Component Value Date   INR 0.89 01/19/2018   Lab Results  Component Value Date   NA 141 01/19/2018   K 3.5 01/19/2018   CL 108 01/19/2018   CO2 26 01/19/2018   BUN 15 01/19/2018   CREATININE 0.81 01/19/2018   GLUCOSE 123 (H) 01/19/2018    Discharge Medications:   Allergies as of 02/01/2018      Reactions   Ceclor [cefaclor] Anaphylaxis, Hives   Demerol [meperidine] Nausea And Vomiting   Erythromycin    EES caused jaundice like symptoms    Flagyl  [metronidazole] Swelling   Hydrochlorothiazide    severe cramping in her legs/knee/calves   Hydrocodone-acetaminophen Nausea And Vomiting   Morphine And Related    Pt prefers to not be given morphine, tolerates hydromorphone instead    Sulfa Antibiotics Swelling   Penicillins Hives, Rash   Has patient had a PCN reaction causing immediate rash, facial/tongue/throat swelling, SOB or lightheadedness with hypotension: Yes Has patient had a PCN reaction causing severe rash involving mucus membranes or skin necrosis: Yes Has patient had a PCN reaction that required hospitalization: Yes Has patient had a PCN reaction occurring within the last 10 years: No If all of the above answers are "NO", then may proceed with Cephalosporin use.      Medication List    STOP taking these medications   ADVIL SINUS CONGESTION & PAIN PO   ibuprofen 200 MG tablet Commonly known as:  ADVIL,MOTRIN     TAKE these medications   enoxaparin 40 MG/0.4ML injection Commonly known as:  LOVENOX Inject 0.4 mLs (40 mg total) into the skin daily for 14 days. Start taking on:  02/03/2018   lisinopril 40 MG tablet Commonly known as:  PRINIVIL,ZESTRIL Take 1 tablet (40 mg total) by mouth daily. What changed:  additional  instructions   LORazepam 0.5 MG tablet Commonly known as:  ATIVAN Take 1 tablet (0.5 mg total) by mouth 2 (two) times daily as needed for anxiety.   metoprolol succinate 50 MG 24 hr tablet Commonly known as:  TOPROL-XL Take 1 tablet (50 mg total) by mouth daily. What changed:  additional instructions   oxyCODONE 5 MG immediate release tablet Commonly known as:  Oxy IR/ROXICODONE Take 1 tablet (5 mg total) by mouth every 4 (four) hours as needed for moderate pain (pain score 4-6).   traMADol 50 MG tablet Commonly known as:  ULTRAM Take 1-2 tablets (50-100 mg total) by mouth every 6 (six) hours as needed for moderate pain.            Durable Medical Equipment  (From admission, onward)         Start     Ordered   01/31/18 2050  DME Walker rolling  Once    Question:  Patient needs a walker to treat with the following condition  Answer:  Total knee replacement status   01/31/18 2049   01/31/18 2050  DME Bedside commode  Once    Question:  Patient needs a bedside commode to treat with the following condition  Answer:  Total knee replacement status   01/31/18 2049      Diagnostic Studies: Dg Knee Right Port  Result Date: 01/31/2018 CLINICAL DATA:  Knee replacement EXAM: PORTABLE RIGHT KNEE - 1-2 VIEW COMPARISON:  None. FINDINGS: Cutaneous staples over the knee with surgical drain. Status post left knee replacement with no fracture and normal alignment. Gas in the soft tissues consistent with recent operative status IMPRESSION: Status post left knee replacement with expected operative changes. Electronically Signed   By: Donavan Foil M.D.   On: 01/31/2018 19:49    Disposition:   Discharge Instructions    Increase activity slowly   Complete by:  As directed       Follow-up Information    Watt Climes, PA On 02/15/2018.   Specialty:  Physician Assistant Why:  at 9:45am Contact information: [place] [address] [phone]        Dereck Leep, MD On 03/31/2018.   Specialty:  Orthopedic Surgery Why:  at 3:30pm Contact information: [place] [address] [phone]            Signed: Watt Climes. 02/01/2018, 7:59 AM

## 2018-02-01 NOTE — Clinical Social Work Placement (Signed)
   CLINICAL SOCIAL WORK PLACEMENT  NOTE  Date:  02/01/2018  Patient Details  Name: ANNISA MAZZARELLA MRN: [id number] Date of Birth: 1956/02/08  Clinical Social Work is seeking post-discharge placement for this patient at the [place] level of care (*CSW will initial, date and re-position this form in  chart as items are completed):  Yes   Patient/family provided with [facility] Work Department's list of facilities offering this level of care within the geographic area requested by the patient (or if unable, by the patient's family).  Yes   Patient/family informed of their freedom to choose among providers that offer the needed level of care, that participate in Medicare, Medicaid or managed care program needed by the patient, have an available bed and are willing to accept the patient.  Yes   Patient/family informed of [facility]'s ownership interest in [facility] and [facility], as well as of the fact that they are under no obligation to receive care at these facilities.  PASRR submitted to EDS on 02/01/18     PASRR number received on 02/01/18     Existing PASRR number confirmed on       FL2 transmitted to all facilities in geographic area requested by pt/family on 02/01/18     FL2 transmitted to all facilities within larger geographic area on       Patient informed that his/her managed care company has contracts with or will negotiate with certain facilities, including the following:        Yes   Patient/family informed of bed offers received.  Patient chooses bed at [facility] )     Physician recommends and patient chooses bed at      Patient to be transferred to   on  .  Patient to be transferred to facility by       Patient family notified on   of transfer.  Name of family member notified:        PHYSICIAN       Additional Comment:    _______________________________________________ Dustin Bumbaugh, Veronia Beets,  LCSW 02/01/2018, 12:17 PM

## 2018-02-01 NOTE — Progress Notes (Signed)
NT notified RN of pt's BP 60-70s/40s. Manual BP reading was 78/52, with HR 45. Pt is sitting in chair, feels "woozy" and is drowsy per pt's sister. Vance Peper, PA notified & received order for 500 cc bolus, stated does not need hemoglobin check at this time. Pt and her sister updated. Will continue to monitor.   [place], Jerry Caras

## 2018-02-01 NOTE — Clinical Social Work Note (Signed)
Clinical Social Work Assessment  Patient Details  Name: Ruth Higgins MRN: [id number] Date of Birth: 10-01-1955  Date of referral:  02/01/18               Reason for consult:  Facility Placement                Permission sought to share information with:  Chartered certified accountant granted to share information::  Yes, Verbal Permission Granted  Name::      [place]::   [place]   Relationship::     Contact Information:     Housing/Transportation Living arrangements for the past 2 months:  [place] of Information:  Patient, Siblings Patient Interpreter Needed:  None Criminal Activity/Legal Involvement Pertinent to Current Situation/Hospitalization:  No - Comment as needed Significant Relationships:  Parents, Siblings Lives with:  Parents Do you feel safe going back to the place where you live?  Yes Need for family participation in patient care:  Yes (Comment)  Care giving concerns:  Patient lives in [place] with her mother [name].    Social Worker assessment / plan:  Holiday representative (CSW) received SNF consult. PT is recommending SNF. CSW met with patient and her sister/ HPOA Vickie was at bedside. Patient was alert and oriented X4 and was sitting up in the chair at bedside. CSW introduced self and explained role of CSW department. Per patient she lives with her mother [name] in [place]. CSW explained SNF process and that UMR will have to approve it. 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 Patient is okay with a semi-private room at [facility]. FL2 complete and faxed out.   CSW presented bed offers to patient and she chose [facility]. Per [facility] admissions coordinator at [facility] she will start [facility] SNF authorization today. CSW will continue to follow and assist as needed.    Employment status:  Therapist, music:  Managed Care PT Recommendations:   [place] / Referral to community resources:  [place]  Patient/Family's Response to care:  Patient is agreeable to D/C to [facility].   Patient/Family's Understanding of and Emotional Response to Diagnosis, Current Treatment, and Prognosis:  Patient was very pleasant and thanked CSW for assistance.   Emotional Assessment Appearance:  Appears stated age Attitude/Demeanor/Rapport:    Affect (typically observed):  Accepting, Adaptable, Pleasant Orientation:  Oriented to Self, Oriented to Place, Oriented to  Time, Oriented to Situation Alcohol / Substance use:  Not Applicable Psych involvement (Current and /or in the community):  No (Comment)  Discharge Needs  Concerns to be addressed:  Discharge Planning Concerns Readmission within the last 30 days:  No Current discharge risk:  Dependent with Mobility Barriers to Discharge:  Continued Medical Work up   [employer], Veronia Beets, LCSW 02/01/2018, 12:17 PM

## 2018-02-01 NOTE — Evaluation (Signed)
Occupational Therapy Evaluation Patient Details Name: Ruth Higgins MRN: [id number] DOB: 30-Jun-1956 Today's Date: 02/01/2018    History of Present Illness pt presents to hospital on 01/31/18 for an elective R TKA  performed by Dr. Marry Guan. Pt has a past medical history that includes HTN, anxiety, depression, and tobacco use   Clinical Impression   Pt seen for OT evaluation this date, POD#1 from above surgery. Pt was independent in all ADLs and IADLs, was driving and working, she did report "hobbling" in work and home environment due to R knee pain. Pt is primary caregiver to her elderly mother who is blind as well. Pt is eager to return to PLOF with less pain and improved safety and independence. Pt currently requires MAX A for LB dressing while in seated position due to pain and limited AROM of R knee. Pt instructed in polar care mgt, falls prevention strategies, home/routines modifications, DME/AE for LB bathing and dressing tasks, and compression stocking mgt. Pt would benefit from skilled OT services including additional instruction in dressing techniques with or without assistive devices for dressing and bathing skills to support recall and carryover prior to discharge and ultimately to maximize safety, independence, and minimize falls risk and caregiver burden. Pt will likely require SNF post-hospitalization d/t high PLOF relative to current status.      Follow Up Recommendations  SNF    Equipment Recommendations  3 in 1 bedside commode    Recommendations for Other Services       Precautions / Restrictions Precautions Precautions: None Restrictions Weight Bearing Restrictions: Yes RLE Weight Bearing: Weight bearing as tolerated      Mobility Bed Mobility                Transfers     Balance                            ADL either performed or assessed with clinical judgement   ADL Overall ADL's : Needs assistance/impaired Eating/Feeding:  Independent   Grooming: Wash/dry face;Set up;Sitting       Lower Body Bathing: Maximal assistance   Upper Body Dressing : Maximal assistance                     General ADL Comments: functional ADL mobility not assessed d/t low BPs, nurse requests hold on fxl mobility at this time.      Vision Patient Visual Report: No change from baseline       Perception     Praxis      Pertinent Vitals/Pain Pain Assessment: 0-10 Pain Score: 1  Pain Location: R knee Pain Descriptors / Indicators: Sharp Pain Intervention(s): Limited activity within patient's tolerance;Monitored during session;Premedicated before session     Hand Dominance Right   Extremity/Trunk Assessment Upper Extremity Assessment Upper Extremity Assessment: Overall WFL for tasks assessed   Lower Extremity Assessment Lower Extremity Assessment: RLE deficits/detail;LLE deficits/detail RLE Deficits / Details: Unable to fully assess due to pain RLE Sensation: WNL LLE Deficits / Details: grossly 3/5 LLE Sensation: WNL       Communication Communication Communication: No difficulties   Cognition Arousal/Alertness: Awake/alert Behavior During Therapy: WFL for tasks assessed/performed(pt slightly lethargic during assessment, reports pain meds shortly before. ) Overall Cognitive Status: Within Functional Limits for tasks assessed  General Comments       Exercises  Other Exercises Other Exercises: Pt educated on use of reacher, sock aid, LH shoe horn to reduce pain and increase efficacy of LB dressing tasks while healthing as well as pouch for front of walker for fall prevention and safety.  Pt lethargic during session, but verbalized understanding. Family member present able to follow steps of demo provided.    Shoulder Instructions      Home Living Family/patient expects to be discharged to:: Private residence Living Arrangements: Spouse/significant  other;Parent Available Help at Discharge: Other (Comment) Type of Home: House Home Access: Stairs to enter [employer] of Steps: 6 outside   Home Layout: Two level Alternate Level Stairs-Number of Steps: 20 inside with landing Alternate Level Stairs-Rails: Right Bathroom Shower/Tub: Teacher, early years/pre: Standard     Home Equipment: None          Prior Functioning/Environment Level of Independence: Independent        Comments: pt states that she was independent at PLOF with all IADLs/ALDLs, she was working and driving, reports no falls.         OT Problem List: Decreased strength;Impaired vision/perception;Decreased range of motion;Decreased activity tolerance;Impaired balance (sitting and/or standing);Decreased coordination;Pain      OT Treatment/Interventions: Self-care/ADL training;Therapeutic exercise;Neuromuscular education;Energy conservation;DME and/or AE instruction;Therapeutic activities;Balance training;Patient/family education    OT Goals(Current goals can be found in the care plan section) Acute Rehab OT Goals Patient Stated Goal: "to get back home to take care of mom" OT Goal Formulation: With patient Time For Goal Achievement: 02/15/18 Potential to Achieve Goals: Good ADL Goals Pt Will Perform Lower Body Dressing: with supervision;with adaptive equipment(sitting and standing as needed for clothing mgt over hips) Additional ADL Goal #1: Pt will tolerate 5 min stand with SUP with FWW to complete grooming/hygiene sink-side in prep for higher level IADL performance.  OT Frequency: Min 1X/week   Barriers to D/C:            Co-evaluation              AM-PAC PT "6 Clicks" Daily Activity     Outcome Measure Help from another person eating meals?: None Help from another person taking care of personal grooming?: A Little Help from another person toileting, which includes using toliet, bedpan, or urinal?: A Lot Help from another  person bathing (including washing, rinsing, drying)?: A Lot   Help from another person to put on and taking off regular lower body clothing?: A Lot 6 Click Score: 13   End of Session Nurse Communication: (notified by OT and CNA of VS and likely need to withold mobility until stabilized. )  Activity Tolerance: Patient limited by lethargy;Treatment limited secondary to medical complications (Comment)(limited d/t low BP) Patient left: in chair;with call bell/phone within reach;with chair alarm set;with nursing/sitter in room  OT Visit Diagnosis: Unsteadiness on feet (R26.81);Other abnormalities of gait and mobility (R26.89);Muscle weakness (generalized) (M62.81)                Time: 0092-3300 OT Time Calculation (min): 23 min Charges:  OT Evaluation $OT Eval Moderate Complexity: 1 Mod OT Treatments $Self Care/Home Management : 8-22 mins  Gerrianne Scale, MS, OTR/L ascom [phone] or [phone] 02/01/18, 2:40 PM

## 2018-02-01 NOTE — Progress Notes (Signed)
BPs after bolus continue to be low, 90/75 & 80/45. Pt is asymptomatic. Dr. Marry Guan notified, instructed to keep maintenance fluids going (have been infusing all day) and to hold scheduled lisinopril ordered at 1 AM.   Raquel James

## 2018-02-01 NOTE — NC FL2 (Signed)
[place] LEVEL OF CARE SCREENING TOOL     IDENTIFICATION  Patient Name: Ruth Higgins Birthdate: [birth year] Sex: female Admission Date (Current Location): 01/31/2018  [place] and [place] Number:  Engineering geologist and Address:  [facility], [address] [place], [place] [address]      Provider Number: [id number]  Attending Physician Name and Address:  Dereck Leep, MD  Relative Name and Phone Number:       Current Level of Care: Hospital Recommended Level of Care: [place] Prior Approval Number:    Date Approved/Denied:   PASRR Number: [id number])  Discharge Plan: SNF    Current Diagnoses: Patient Active Problem List   Diagnosis Date Noted  . S/P total knee arthroplasty 01/31/2018  . Primary osteoarthritis of both knees 11/19/2017  . Personal history of tobacco use, presenting hazards to health 12/01/2016  . Encounter for screening colonoscopy 12/06/2015  . Abdominal mass 11/23/2015  . Neck mass 11/23/2015  . Hyperglycemia 05/20/2015  . Anxiety, mild 03/15/2015  . Allergic rhinitis 03/15/2015  . Depression, major, single episode, mild ([place]) 03/15/2015  . Essential (primary) hypertension 03/15/2015  . Fam hx-polycystic kidney 03/15/2015  . HLD (hyperlipidemia) 03/15/2015  . Cannot sleep 03/15/2015  . Menopause 03/15/2015  . Obesity 03/15/2015  . Arthritis of knee, degenerative 03/15/2015  . Arthritis, degenerative 03/15/2015  . Current tobacco use 03/15/2015    Orientation RESPIRATION BLADDER Height & Weight     Self, Time, Situation, Place  Normal Continent Weight: 99.8 kg (220 lb) Height:  5\' 1"  (154.9 cm)  BEHAVIORAL SYMPTOMS/MOOD NEUROLOGICAL BOWEL NUTRITION STATUS      Continent Diet(Diet: Regular )  AMBULATORY STATUS COMMUNICATION OF NEEDS Skin   Extensive Assist Verbally Surgical wounds(Incision: Right Knee. )                       Personal Care Assistance Level of  Assistance  Bathing, Feeding, Dressing Bathing Assistance: Limited assistance Feeding assistance: Independent Dressing Assistance: Limited assistance     Functional Limitations Info  Sight, Hearing, Speech Sight Info: Adequate Hearing Info: Adequate Speech Info: Adequate    SPECIAL CARE FACTORS FREQUENCY  PT (By licensed PT), OT (By licensed OT)     PT Frequency: (5) OT Frequency: (5)            Contractures      Additional Factors Info  Code Status, Allergies Code Status Info: (Full Code. ) Allergies Info: (Ceclor Cefaclor, Demerol Meperidine, Erythromycin, Flagyl Metronidazole, Hydrochlorothiazide, Hydrocodone-acetaminophen, Morphine And Related, Sulfa Antibiotics, Penicillins)           Current Medications (02/01/2018):  This is the current hospital active medication list Current Facility-Administered Medications  Medication Dose Route Frequency Provider Last Rate Last Dose  . 0.9 %  sodium chloride infusion   Intravenous Continuous Hooten, Laurice Record, MD 100 mL/hr at 02/01/18 0800    . 0.9 %  sodium chloride infusion   Intravenous Continuous Gunnar Fusi, MD   Stopped at 01/31/18 2100  . acetaminophen (OFIRMEV) IV 1,000 mg  1,000 mg Intravenous Q6H Hooten, Laurice Record, MD 400 mL/hr at 02/01/18 1112 1,000 mg at 02/01/18 1112  . acetaminophen (TYLENOL) tablet 325-650 mg  325-650 mg Oral Q6H PRN Hooten, Laurice Record, MD      . alum & mag hydroxide-simeth (MAALOX/MYLANTA) 200-200-20 MG/5ML suspension 30 mL  30 mL Oral Q4H PRN Hooten, Laurice Record, MD      . bisacodyl (DULCOLAX)  suppository 10 mg  10 mg Rectal Daily PRN Hooten, Laurice Record, MD      . celecoxib (CELEBREX) capsule 200 mg  200 mg Oral BID Hooten, Laurice Record, MD   200 mg at 02/01/18 1004  . clindamycin (CLEOCIN) IVPB 600 mg  600 mg Intravenous Q6H Hooten, Laurice Record, MD   Stopped at 02/01/18 1036  . diphenhydrAMINE (BENADRYL) 12.5 MG/5ML elixir 12.5-25 mg  12.5-25 mg Oral Q4H PRN Hooten, Laurice Record, MD      . enoxaparin (LOVENOX)  injection 40 mg  40 mg Subcutaneous Q12H Hooten, Laurice Record, MD   40 mg at 02/01/18 0757  . ferrous sulfate tablet 325 mg  325 mg Oral BID WC Hooten, Laurice Record, MD   325 mg at 02/01/18 0755  . gabapentin (NEURONTIN) capsule 300 mg  300 mg Oral QHS Hooten, Laurice Record, MD   300 mg at 01/31/18 2216  . HYDROmorphone (DILAUDID) injection 0.5-1 mg  0.5-1 mg Intravenous Q4H PRN Hooten, Laurice Record, MD      . lisinopril (PRINIVIL,ZESTRIL) tablet 40 mg  40 mg Oral Daily Hooten, Laurice Record, MD   40 mg at 01/31/18 2313  . LORazepam (ATIVAN) tablet 0.5 mg  0.5 mg Oral BID PRN Hooten, Laurice Record, MD      . magnesium hydroxide (MILK OF MAGNESIA) suspension 30 mL  30 mL Oral Daily Hooten, Laurice Record, MD   30 mL at 02/01/18 1006  . menthol-cetylpyridinium (CEPACOL) lozenge 3 mg  1 lozenge Oral PRN Hooten, Laurice Record, MD       Or  . phenol (CHLORASEPTIC) mouth spray 1 spray  1 spray Mouth/Throat PRN Hooten, Laurice Record, MD      . metoCLOPramide (REGLAN) tablet 5-10 mg  5-10 mg Oral Q8H PRN Hooten, Laurice Record, MD       Or  . metoCLOPramide (REGLAN) injection 5-10 mg  5-10 mg Intravenous Q8H PRN Hooten, Laurice Record, MD      . metoCLOPramide (REGLAN) tablet 10 mg  10 mg Oral TID AC & HS Hooten, Laurice Record, MD   10 mg at 02/01/18 1112  . metoprolol succinate (TOPROL-XL) 24 hr tablet 50 mg  50 mg Oral Daily Hooten, Laurice Record, MD   50 mg at 02/01/18 1004  . nicotine (NICODERM CQ - dosed in mg/24 hours) patch 21 mg  21 mg Transdermal Daily Hooten, Laurice Record, MD   21 mg at 02/01/18 1004  . ondansetron (ZOFRAN) tablet 4 mg  4 mg Oral Q6H PRN Hooten, Laurice Record, MD       Or  . ondansetron (ZOFRAN) injection 4 mg  4 mg Intravenous Q6H PRN Hooten, Laurice Record, MD      . oxyCODONE (Oxy IR/ROXICODONE) immediate release tablet 10 mg  10 mg Oral Q4H PRN Dereck Leep, MD   10 mg at 02/01/18 0755  . oxyCODONE (Oxy IR/ROXICODONE) immediate release tablet 5 mg  5 mg Oral Q4H PRN Hooten, Laurice Record, MD   5 mg at 01/31/18 2215  . pantoprazole (PROTONIX) EC tablet 40 mg  40 mg Oral  BID Dereck Leep, MD   40 mg at 02/01/18 1004  . pentafluoroprop-tetrafluoroeth (GEBAUERS) aerosol   Topical PRN Gunnar Fusi, MD      . scopolamine (TRANSDERM-SCOP) 1 MG/3DAYS 1.5 mg  1 patch Transdermal Q72H Alvin Critchley, MD   1.5 mg at 01/31/18 1430  . senna-docusate (Senokot-S) tablet 1 tablet  1 tablet Oral BID Hooten, Laurice Record, MD   1  tablet at 02/01/18 1004  . sodium phosphate (FLEET) 7-19 GM/118ML enema 1 enema  1 enema Rectal Once PRN Hooten, Laurice Record, MD      . traMADol Veatrice Bourbon) tablet 50-100 mg  50-100 mg Oral Q4H PRN Dereck Leep, MD   100 mg at 02/01/18 1112     Discharge Medications: Please see discharge summary for a list of discharge medications.  Relevant Imaging Results:  Relevant Lab Results:   Additional Information (SSN: [id number])  Kameren Pargas, Veronia Beets, LCSW

## 2018-02-01 NOTE — Anesthesia Postprocedure Evaluation (Signed)
Anesthesia Post Note  Patient: Ruth Higgins  Procedure(s) Performed: COMPUTER ASSISTED TOTAL KNEE ARTHROPLASTY (Right Knee)  Patient location during evaluation: Nursing Unit Anesthesia Type: Spinal Level of consciousness: oriented and awake and alert Pain management: pain level controlled Vital Signs Assessment: post-procedure vital signs reviewed and stable Respiratory status: spontaneous breathing, respiratory function stable and patient connected to nasal cannula oxygen Cardiovascular status: blood pressure returned to baseline and stable Postop Assessment: no headache, no backache and no apparent nausea or vomiting Anesthetic complications: no     Last Vitals:  Vitals:   02/01/18 0343 02/01/18 0901  BP: (!) 103/58 103/66  Pulse: (!) 46 (!) 49  Resp: 18 18  Temp: 37.1 C (!) 36.3 C  SpO2: 94% 97%    Last Pain:  Vitals:   02/01/18 1142  TempSrc:   PainSc: 3                  Adaliz Dobis,  Clearnce Sorrel

## 2018-02-01 NOTE — Progress Notes (Signed)
Physical Therapy Treatment Patient Details Name: Ruth Higgins MRN: [id number] DOB: [birth year] Today's Date: 02/01/2018    History of Present Illness pt presents to hospital on 01/31/18 for an elective R TKA  performed by Dr. Marry Guan. Pt has a past medical history that includes HTN, anxiety, depression, and tobacco use    PT Comments    Pt agreeable to PT; mild to moderate pain, well controlled with medication. Pt noted to continue with low BP recently taken by nurse. Pt quite lethargic this session, but participatory. Pt up in chair comfortably and wishes to remain in chair. Pt works well with low level strengthening as well as stretching of R knee. Answered questions regarding continuation of stretching further out in recovery process as well as basic strengthening. Pt/family also question regarding ability to walk to bathroom versus BSC use; advised that pt can do so with assist once BP is stable, which would preferably be tomorrow. Continue PT to progress endurance, strength to improve functional mobility.    Follow Up Recommendations  SNF     Equipment Recommendations  Rolling walker with 5" wheels;3in1 (PT)    Recommendations for Other Services       Precautions / Restrictions Precautions Precautions: Other (comment)(currently low BP) Restrictions Weight Bearing Restrictions: Yes RLE Weight Bearing: Weight bearing as tolerated    Mobility  Bed Mobility               General bed mobility comments: Not tested; up in chair and wishes to remain so  Transfers                 General transfer comment: held due to low BP  Ambulation/Gait                 Stairs             Wheelchair Mobility    Modified Rankin (Stroke Patients Only)       Balance                                            Cognition Arousal/Alertness: Lethargic;Suspect due to medications Behavior During Therapy: [facility] for tasks  assessed/performed Overall Cognitive Status: Within Functional Limits for tasks assessed                                 General Comments: Some difficulty staying awake throughout session      Exercises Total Joint Exercises Ankle Circles/Pumps: AROM;Both;20 reps Quad Sets: Strengthening;Both;20 reps Gluteal Sets: Strengthening;Both;20 reps Straight Leg Raises: Strengthening;Right;10 reps Long Arc Quad: AROM;Right;10 reps Knee Flexion: AROM;Right;20 reps;Seated(3 positions each rep) Other Exercises Other Exercises: educated on continuation/progression of stretching protocols for knee flex/ext and basic strengthening    General Comments        Pertinent Vitals/Pain Pain Assessment: Faces Pain Score: 1  Faces Pain Scale: Hurts little more Pain Location: R knee Pain Descriptors / Indicators: Sharp Pain Intervention(s): Monitored during session;Ice applied    Home Living Family/patient expects to be discharged to:: Private residence Living Arrangements: Spouse/significant other;Parent Available Help at Discharge: Other (Comment) Type of Home: House Home Access: Stairs to enter   Home Layout: Two level Home Equipment: None      Prior Function Level of Independence: Independent      Comments: pt states that she was  independent at [facility] with all IADLs/ALDLs, she was working and driving, reports no falls.    PT Goals (current goals can now be found in the care plan section) Acute Rehab PT Goals Patient Stated Goal: "to get back home to take care of mom" Progress towards PT goals: Progressing toward goals(slowly due to low BP currently)    Frequency    BID      PT Plan Current plan remains appropriate    Co-evaluation              AM-PAC PT "6 Clicks" Daily Activity  Outcome Measure  Difficulty turning over in bed (including adjusting bedclothes, sheets and blankets)?: A Little Difficulty moving from lying on back to sitting on the side of  the bed? : A Little Difficulty sitting down on and standing up from a chair with arms (e.g., wheelchair, bedside commode, etc,.)?: Unable Help needed moving to and from a bed to chair (including a wheelchair)?: A Little Help needed walking in hospital room?: A Lot Help needed climbing 3-5 steps with a railing? : A Lot 6 Click Score: 14    End of Session   Activity Tolerance: Other (comment)(limited BP) Patient left: in chair;with call bell/phone within reach;with family/visitor present;Other (comment)(polar care in place; did not wish alarm; will call to get up)   PT Visit Diagnosis: Unsteadiness on feet (R26.81);Other abnormalities of gait and mobility (R26.89);Muscle weakness (generalized) (M62.81);Difficulty in walking, not elsewhere classified (R26.2);Pain Pain - Right/Left: Right Pain - part of body: Knee     Time: 0254-2706 PT Time Calculation (min) (ACUTE ONLY): 28 min  Charges:  $Therapeutic Exercise: 23-37 mins                      Larae Grooms, PTA 02/01/2018, 4:04 PM

## 2018-02-02 DIAGNOSIS — Z96651 Presence of right artificial knee joint: Secondary | ICD-10-CM | POA: Diagnosis not present

## 2018-02-02 DIAGNOSIS — M170 Bilateral primary osteoarthritis of knee: Secondary | ICD-10-CM | POA: Diagnosis not present

## 2018-02-02 DIAGNOSIS — Z6841 Body Mass Index (BMI) 40.0 and over, adult: Secondary | ICD-10-CM | POA: Diagnosis not present

## 2018-02-02 DIAGNOSIS — I10 Essential (primary) hypertension: Secondary | ICD-10-CM | POA: Diagnosis not present

## 2018-02-02 DIAGNOSIS — F419 Anxiety disorder, unspecified: Secondary | ICD-10-CM | POA: Diagnosis not present

## 2018-02-02 DIAGNOSIS — Z471 Aftercare following joint replacement surgery: Secondary | ICD-10-CM | POA: Diagnosis not present

## 2018-02-02 DIAGNOSIS — Z87891 Personal history of nicotine dependence: Secondary | ICD-10-CM | POA: Diagnosis not present

## 2018-02-02 DIAGNOSIS — M6281 Muscle weakness (generalized): Secondary | ICD-10-CM | POA: Diagnosis not present

## 2018-02-02 DIAGNOSIS — R609 Edema, unspecified: Secondary | ICD-10-CM | POA: Diagnosis not present

## 2018-02-02 DIAGNOSIS — R600 Localized edema: Secondary | ICD-10-CM | POA: Diagnosis not present

## 2018-02-02 DIAGNOSIS — Z720 Tobacco use: Secondary | ICD-10-CM | POA: Diagnosis not present

## 2018-02-02 DIAGNOSIS — M1712 Unilateral primary osteoarthritis, left knee: Secondary | ICD-10-CM | POA: Diagnosis not present

## 2018-02-02 DIAGNOSIS — R262 Difficulty in walking, not elsewhere classified: Secondary | ICD-10-CM | POA: Diagnosis not present

## 2018-02-02 NOTE — Clinical Social Work Placement (Signed)
   CLINICAL SOCIAL WORK PLACEMENT  NOTE  Date:  02/02/2018  Patient Details  Name: Ruth Higgins MRN: [id number] Date of Birth: April 26, 1956  Clinical Social Work is seeking post-discharge placement for this patient at the [place] level of care (*CSW will initial, date and re-position this form in  chart as items are completed):  Yes   Patient/family provided with [facility] Work Department's list of facilities offering this level of care within the geographic area requested by the patient (or if unable, by the patient's family).  Yes   Patient/family informed of their freedom to choose among providers that offer the needed level of care, that participate in Medicare, Medicaid or managed care program needed by the patient, have an available bed and are willing to accept the patient.  Yes   Patient/family informed of [facility]'s ownership interest in [facility] and [facility], as well as of the fact that they are under no obligation to receive care at these facilities.  PASRR submitted to EDS on 02/01/18     PASRR number received on 02/01/18     Existing PASRR number confirmed on       FL2 transmitted to all facilities in geographic area requested by pt/family on 02/01/18     FL2 transmitted to all facilities within larger geographic area on       Patient informed that his/her managed care company has contracts with or will negotiate with certain facilities, including the following:        Yes   Patient/family informed of bed offers received.  Patient chooses bed at [facility] )     Physician recommends and patient chooses bed at      Patient to be transferred to [facility] ) on 02/02/18.  Patient to be transferred to facility by (Patient's son Zenia Resides will transport. )     Patient family notified on 02/02/18 of transfer.  Name of family member notified:  (Patient's son Zenia Resides is at bedside and aware of D/C today. )      PHYSICIAN       Additional Comment:    _______________________________________________ Delphia Kaylor, Veronia Beets, LCSW 02/02/2018, 1:40 PM

## 2018-02-02 NOTE — Progress Notes (Addendum)
Pt ready for d/c to SNF today per MD. Report called to nurse at [facility], all questions answered. PIV removed, MD aware of HR in 40-50s-metoprolol held this morning per verbal order from Vance Peper. Pt had a small BM. Dressing changed to right knee per order. Pt's son will be transporting pt.  Pt reported knee "popping" x2 with PT-although not reported by PT. Pt states that she does not feel like knee buckled, but that her "knee was locked too long." Waiting to hear back from Vance Peper. Vance Peper called back- no orders received, ok to discharge.  [place], Jerry Caras

## 2018-02-02 NOTE — Progress Notes (Signed)
Patient is medically stable for D/C to [facility] today. Per [facility] admissions coordinator at [facility] authorization has been received and patient can come today to room 206. RN will call report at [phone] and patient's son Zenia Resides will transport. Clinical Education officer, museum (CSW) sent D/C orders to [employer] via [employer]. Patient is aware of above. Patient's son Zenia Resides is at bedside and aware of above. Please reconsult if future social work needs arise. CSW signing off.   [employer], LCSW [phone]

## 2018-02-02 NOTE — Progress Notes (Signed)
   Subjective: 2 Days Post-Op Procedure(s) (LRB): COMPUTER ASSISTED TOTAL KNEE ARTHROPLASTY (Right) Patient reports pain as 0 on 0-10 scale.   Patient is well, and has had no acute complaints or problems We will start therapy today.  Plan is to go Rehab after hospital stay. no nausea and no vomiting Patient denies any chest pains or shortness of breath. Voicing no complaints.  Objective: Vital signs in last 24 hours: Temp:  [97.4 F (36.3 C)-97.6 F (36.4 C)] 97.6 F (36.4 C) (07/31 0357) Pulse Rate:  [38-59] 50 (07/31 0357) Resp:  [13-20] 13 (07/31 0357) BP: (62-123)/(40-75) 112/53 (07/31 0357) SpO2:  [91 %-98 %] 91 % (07/31 0357) well approximated incision Heels are non tender and elevated off the bed using rolled towels Intake/Output from previous day: 07/30 0701 - 07/31 0700 In: 4160 [P.O.:960; I.V.:2500; IV Piggyback:700] Out: 155 [Drains:155] Intake/Output this shift: No intake/output data recorded.  No results for input(s): HGB in the last 72 hours. No results for input(s): WBC, RBC, HCT, PLT in the last 72 hours. No results for input(s): NA, K, CL, CO2, BUN, CREATININE, GLUCOSE, CALCIUM in the last 72 hours. No results for input(s): LABPT, INR in the last 72 hours.  EXAM General - Patient is Alert, Appropriate and Oriented Extremity - Neurologically intact Neurovascular intact Sensation intact distally Intact pulses distally Dorsiflexion/Plantar flexion intact No cellulitis present Compartment soft Dressing - dressing C/D/I Motor Function - intact, moving foot and toes well on exam.    Past Medical History:  Diagnosis Date  . Arthritis   . Family history of adverse reaction to anesthesia    brother hard to wake up after surgery  . Hypertension   . PONV (postoperative nausea and vomiting) 1980s  . Pre-diabetes     Assessment/Plan: 2 Days Post-Op Procedure(s) (LRB): COMPUTER ASSISTED TOTAL KNEE ARTHROPLASTY (Right) Active Problems:   S/P total knee  arthroplasty  Estimated body mass index is 41.57 kg/m as calculated from the following:   Height as of this encounter: 5\' 1"  (1.549 m).   Weight as of this encounter: 99.8 kg (220 lb). Up with therapy Discharge to SNF  Labs: None DVT Prophylaxis - Lovenox, Foot Pumps and TED hose Weight-Bearing as tolerated to right leg Patient needs a bowel movement Please wash operative leg, change dressing and apply TED stockings to both legs  Jon R. [place] 02/02/2018, 7:30 AM

## 2018-02-02 NOTE — Progress Notes (Signed)
Physical Therapy Treatment Patient Details Name: Ruth Higgins MRN: [id number] DOB: 05/19/1956 Today's Date: 02/02/2018    History of Present Illness pt presents to hospital on 01/31/18 for an elective R TKA  performed by Dr. Marry Guan. Pt has a past medical history that includes HTN, anxiety, depression, and tobacco use    PT Comments    Pt agreeable to PT; minimal pain R knee at rest, 8/10 with ambulation. Pt orthostatic BP taken and noted to be much improved and WNL (see vitals flow sheet). HR noted to be low 43 - 48 bpm throughout session; pt asymptomatic. Discussed with nursing. Pt progressing all mobility; ambulation of 40 ft with education on improved sequence. Pt received up in chair comfortably and improving R knee active range of motion 0-77 degrees. Pt to discharge to skilled nursing facility to continue rehab efforts today.    Follow Up Recommendations  SNF     Equipment Recommendations  Rolling walker with 5" wheels;3in1 (PT)    Recommendations for Other Services       Precautions / Restrictions Precautions Precautions: Knee;Fall Restrictions Weight Bearing Restrictions: Yes RLE Weight Bearing: Weight bearing as tolerated    Mobility  Bed Mobility Overal bed mobility: Modified Independent                Transfers Overall transfer level: Needs assistance Equipment used: Rolling walker (2 wheeled) Transfers: Sit to/from Stand Sit to Stand: Min guard         General transfer comment: Good use of hands and R knee control  Ambulation/Gait Ambulation/Gait assistance: Min guard Gait Distance (Feet): 40 Feet Assistive device: Rolling walker (2 wheeled) Gait Pattern/deviations: Step-to pattern;Step-through pattern;Decreased step length - left;Decreased stance time - right;Decreased dorsiflexion - right;Decreased dorsiflexion - left;Decreased weight shift to right(Partial step through)   Gait velocity interpretation: <1.8 ft/sec, indicate of risk for  recurrent falls General Gait Details: Education for increased heel strike and reciprocal pattern. Pt needs shorter walker for improved ability to de weight RLE   Stairs             Wheelchair Mobility    Modified Rankin (Stroke Patients Only)       Balance Overall balance assessment: Needs assistance Sitting-balance support: Bilateral upper extremity supported;Feet supported Sitting balance-Leahy Scale: Good     Standing balance support: Bilateral upper extremity supported Standing balance-Leahy Scale: Fair                              Cognition Arousal/Alertness: Awake/alert Behavior During Therapy: WFL for tasks assessed/performed Overall Cognitive Status: Within Functional Limits for tasks assessed                                        Exercises Total Joint Exercises Quad Sets: Strengthening;Both;20 reps(also in stand with wt shift) Knee Flexion: AROM;Right;10 reps;Seated(3 positions ea rep with 10 sec hold ea) Goniometric ROM: 0-77    General Comments        Pertinent Vitals/Pain Pain Assessment: 0-10 Pain Score: 8 (with ambulation; minimal at rest) Pain Location: R knee Pain Intervention(s): Limited activity within patient's tolerance;Monitored during session;Premedicated before session;Repositioned;Ice applied    Home Living                      Prior Function  PT Goals (current goals can now be found in the care plan section) Progress towards PT goals: Progressing toward goals    Frequency    BID      PT Plan Current plan remains appropriate    Co-evaluation              AM-PAC PT "6 Clicks" Daily Activity  Outcome Measure  Difficulty turning over in bed (including adjusting bedclothes, sheets and blankets)?: A Little Difficulty moving from lying on back to sitting on the side of the bed? : A Little Difficulty sitting down on and standing up from a chair with arms (e.g.,  wheelchair, bedside commode, etc,.)?: Unable Help needed moving to and from a bed to chair (including a wheelchair)?: A Little Help needed walking in hospital room?: A Little Help needed climbing 3-5 steps with a railing? : A Lot 6 Click Score: 15    End of Session Equipment Utilized During Treatment: Gait belt   Patient left: in chair;with call bell/phone within reach;with chair alarm set;with family/visitor present;Other (comment)(polar care) Nurse Communication: Other (comment)(BP and HR readings) PT Visit Diagnosis: Unsteadiness on feet (R26.81);Other abnormalities of gait and mobility (R26.89);Muscle weakness (generalized) (M62.81);Difficulty in walking, not elsewhere classified (R26.2);Pain Pain - Right/Left: Right Pain - part of body: Knee     Time: 1125-1201 PT Time Calculation (min) (ACUTE ONLY): 36 min  Charges:  $Gait Training: 8-22 mins $Therapeutic Exercise: 8-22 mins                      Larae Grooms, PTA 02/02/2018, 12:58 PM

## 2018-02-03 ENCOUNTER — Encounter
Admission: RE | Admit: 2018-02-03 | Discharge: 2018-02-03 | Disposition: A | Discharge status: Home / Self Care | Payer: 59 | Source: Ambulatory Visit | Attending: Internal Medicine | Admitting: Internal Medicine

## 2018-02-03 ENCOUNTER — Other Ambulatory Visit: Payer: Self-pay | Admitting: *Deleted

## 2018-02-03 ENCOUNTER — Other Ambulatory Visit: Payer: Self-pay

## 2018-02-03 DIAGNOSIS — G4700 Insomnia, unspecified: Secondary | ICD-10-CM

## 2018-02-03 MED ORDER — OXYCODONE HCL 5 MG PO TABS: 5.0000 mg | ORAL_TABLET | ORAL | 0 refills | Status: DC | PRN

## 2018-02-03 MED ORDER — LORAZEPAM 0.5 MG PO TABS: 0.5000 mg | ORAL_TABLET | Freq: Two times a day (BID) | ORAL | 0 refills | Status: DC | PRN

## 2018-02-03 MED ORDER — TRAMADOL HCL 50 MG PO TABS: 50.0000 mg | ORAL_TABLET | Freq: Four times a day (QID) | ORAL | 0 refills | Status: DC | PRN

## 2018-02-03 NOTE — Telephone Encounter (Signed)
Rx sent to [facility] phone : [phone] , fax : [phone]  

## 2018-02-03 NOTE — Patient Outreach (Addendum)
[facility]) Care Management  02/03/2018  Ruth Higgins 29-Mar-1956 [id number]   Subjective: Telephone call to patient's home number, no answer, left HIPAA compliant voicemail message, and requested call back.     Objective: Per KPN (Knowledge Performance Now, point of care tool) and chart review, patient hospitalized 01/31/18 -02/02/18 for PRIMARY OSTEOARTHRITIS OF RIGHT KNEE, status post Right total knee arthroplasty using computer-assisted navigation, on 01/31/18.    Patient has a history of hypertension, Hyperglycemia, HLD (hyperlipidemia), and Pre-diabetes.    Patient discharged to [place] on 02/02/18.       Assessment: Received UMR  Preoperative / Transition of care referral on 01/19/18.   Transition of care follow up pending patient contact.       Plan: RNCM will send unsuccessful outreach  letter, [facility] pamphlet, will call patient for 2nd telephone outreach attempt, transition of care follow up, and proceed with case closure, within 10 business days if no return call.  RNCM will follow up with patient or rehab facility to verify if placement is short term or long term within 10 business days.     Areliz Rothman H. Annia Friendly, BSN, [place] Management [facility] Telephonic CM Phone: [phone] Fax: [phone]

## 2018-02-04 ENCOUNTER — Other Ambulatory Visit: Payer: 59 | Admitting: *Deleted

## 2018-02-04 ENCOUNTER — Other Ambulatory Visit: Payer: Self-pay | Admitting: *Deleted

## 2018-02-04 NOTE — Patient Outreach (Signed)
[facility]) Care Management  02/04/2018  Ruth Higgins [birth year] [id number]   Subjective: Received voicemail from Zack Seal, states she is returning call, and requested call back.   Telephone call to patient's mobile number, spoke with patient, and HIPAA verified.  Discussed [facility] Care Management UMR Transition of care follow up, patient voiced understanding, and is in agreement to follow up.  Patient states she is doing well, currently in rehab at [facility] for the next 2 weeks, planning to discharge to home, and will call this [facility] when discharge date confirmed.  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 States she is accessing the following [facility] benefits: outpatient pharmacy, hospital indemnity (verbally given contact number for Teena Dunk [phone], will file claim if appropriate, verbally given contact number for [facility] [employer] [phone] to request itemized bill), short term disability, and has family medical leave act (FMLA) in place.      Objective:Per KPN (Knowledge Performance Now, point of care tool) and chart review,patient hospitalized 01/31/18 -02/02/18 forPRIMARY OSTEOARTHRITIS OF RIGHT KNEE, status postRighttotal knee arthroplasty using computer-assisted navigation, on 01/31/18. Patient has a history of hypertension, Hyperglycemia,HLD (hyperlipidemia), andPre-diabetes.Patient discharged [facility] on 02/02/18.       Assessment: Received UMR Preoperative / Transition of care referral on 01/19/18. Transition of care follow up pending notification of patient discharge.     Plan:RNCM will call patient for  telephone outreach attempt, transition of care follow up, within 3 business days of rehab discharge notification.     Wandra Babin H. Annia Friendly, BSN, [place] Management [facility]  Telephonic CM Phone: [phone] Fax: [phone]

## 2018-02-04 NOTE — Patient Outreach (Signed)
[facility]) Care Management  02/04/2018  FANNYE MYER [birth year] [id number]   Subjective: Received voicemail message from Remee Charley, states she is returning call, and requested call back on mobile phone [phone]). Telephone call to patient's mobile number, no answer, mailbox full, and unable to leave a message.     Objective: Per KPN (Knowledge Performance Now, point of care tool) and chart review, patient hospitalized 01/31/18 -02/02/18 for PRIMARY OSTEOARTHRITIS OF RIGHT KNEE, status post Righttotal knee arthroplasty using computer-assisted navigation, on 01/31/18.    Patient has a history of hypertension, Hyperglycemia, HLD (hyperlipidemia), and Pre-diabetes.    Patient discharged to [facility] on 02/02/18.       Assessment: Received UMR  Preoperative / Transition of care referral on 01/19/18.   Transition of care follow up pending patient contact.       Plan: RNCM has sent unsuccessful outreach  letter, [facility] pamphlet, will call patient for 3rd telephone outreach attempt, transition of care follow up, and proceed with case closure, within 10 business days if no return call. RNCM will follow up with patient or rehab facility to verify if placement is short term or long term within 10 business days.     Jared Whorley H. Annia Friendly, BSN, [place] Management [facility] Telephonic CM Phone: [phone] Fax: [phone]

## 2018-02-07 ENCOUNTER — Ambulatory Visit: Payer: 59 | Admitting: *Deleted

## 2018-02-09 DIAGNOSIS — R600 Localized edema: Secondary | ICD-10-CM | POA: Insufficient documentation

## 2018-02-10 ENCOUNTER — Non-Acute Institutional Stay (SKILLED_NURSING_FACILITY): Payer: 59 | Admitting: Adult Health

## 2018-02-10 ENCOUNTER — Encounter: Payer: Self-pay | Admitting: Adult Health

## 2018-02-10 DIAGNOSIS — Z96651 Presence of right artificial knee joint: Secondary | ICD-10-CM

## 2018-02-10 DIAGNOSIS — Z720 Tobacco use: Secondary | ICD-10-CM

## 2018-02-10 DIAGNOSIS — F419 Anxiety disorder, unspecified: Secondary | ICD-10-CM

## 2018-02-10 DIAGNOSIS — I10 Essential (primary) hypertension: Secondary | ICD-10-CM

## 2018-02-10 DIAGNOSIS — M170 Bilateral primary osteoarthritis of knee: Secondary | ICD-10-CM

## 2018-02-10 NOTE — Progress Notes (Signed)
Location:   [place] Room Number: 206A Place of Service:  SNF (31)   CODE STATUS: FULL  Allergies  Allergen Reactions  . Ceclor [Cefaclor] Anaphylaxis and Hives  . Demerol [Meperidine] Nausea And Vomiting  . Erythromycin     EES caused jaundice like symptoms   . Flagyl [Metronidazole] Swelling  . Hydrochlorothiazide     severe cramping in her legs/knee/calves  . Hydrocodone-Acetaminophen Nausea And Vomiting  . Morphine And Related     Pt prefers to not be given morphine, tolerates hydromorphone instead   . Sulfa Antibiotics Swelling  . Penicillins Hives and Rash    Has patient had a PCN reaction causing immediate rash, facial/tongue/throat swelling, SOB or lightheadedness with hypotension: Yes Has patient had a PCN reaction causing severe rash involving mucus membranes or skin necrosis: Yes Has patient had a PCN reaction that required hospitalization: Yes Has patient had a PCN reaction occurring within the last 10 years: No If all of the above answers are "NO", then may proceed with Cephalosporin use.     Chief Complaint  Patient presents with  . Hospitalization Follow-up        HPI:  She is a 62 year old who has been hospitalized for a right knee replacement. She is here for short term rehab with her goal to return back home. She denies any fevers; no uncontrolled pain; no anxiety. She wants her nicotine patch stopped as she continues to smoke. There are no nursing concerns at this time. She will continue to be followed for her chronic illnesses including: anxiety hypertension osteoarthritis.   Past Medical History:  Diagnosis Date  . Arthritis   . Family history of adverse reaction to anesthesia    brother hard to wake up after surgery  . Hypertension   . PONV (postoperative nausea and vomiting) 1980s  . Pre-diabetes     Past Surgical History:  Procedure Laterality Date  . ABDOMINAL HYSTERECTOMY  2014   Dr. Sabra Heck  . APPENDECTOMY     . HERNIA REPAIR  2014   Dr. Pat Patrick  . KNEE ARTHROPLASTY Right 01/31/2018   Procedure: COMPUTER ASSISTED TOTAL KNEE ARTHROPLASTY;  Surgeon: Dereck Leep, MD;  Location: [facility] ORS;  Service: Orthopedics;  Laterality: Right;  . KNEE SURGERY Bilateral 2007  . LIPOMA EXCISION  1999   of the left side of the neck  . NECK SURGERY    . SALPINGOOPHORECTOMY Bilateral   . TONSILLECTOMY    . TUBAL LIGATION      Social History   Socioeconomic History  . Marital status: Married    Spouse name: Not on file  . Number of children: Not on file  . Years of education: Not on file  . Highest education level: Not on file  Occupational History  . Not on file  Social Needs  . Financial resource strain: Not on file  . Food insecurity:    Worry: Not on file    Inability: Not on file  . Transportation needs:    Medical: Not on file    Non-medical: Not on file  Tobacco Use  . Smoking status: Current Every Day Smoker    Packs/day: 1.00    Years: 48.00    Pack years: 48.00    Types: Cigarettes  . Smokeless tobacco: Never Used  . Tobacco comment: smokes due to stress; has quit several times  Substance and Sexual Activity  . Alcohol use: No    Alcohol/week: 0.0 standard drinks  .  Drug use: No  . Sexual activity: Not on file  Lifestyle  . Physical activity:    Days per week: Not on file    Minutes per session: Not on file  . Stress: Not on file  Relationships  . Social connections:    Talks on phone: Not on file    Gets together: Not on file    Attends religious service: Not on file    Active member of club or organization: Not on file    Attends meetings of clubs or organizations: Not on file    Relationship status: Not on file  . Intimate partner violence:    Fear of current or ex partner: Not on file    Emotionally abused: Not on file    Physically abused: Not on file    Forced sexual activity: Not on file  Other Topics Concern  . Not on file  Social History Narrative  . Not on  file   Family History  Problem Relation Age of Onset  . Alzheimer's disease Father   . Heart disease Father   . Hypothyroidism Sister   . Hypertension Brother   . Polycystic kidney disease Brother   . Depression Sister   . Hypertension Sister   . Anxiety disorder Sister   . Stroke Mother   . Rectal cancer Brother       VITAL SIGNS BP (!) 155/67   Pulse 62   Temp 98 F (36.7 C) (Oral)   Resp 20   Ht 5\' 1"  (1.549 m)   Wt 234 lb (106.1 kg)   SpO2 94%   BMI 44.21 kg/m   Outpatient Encounter Medications as of 02/10/2018  Medication Sig  . Cholecalciferol 4000 units CAPS Take 1 capsule by mouth daily.  Marland Kitchen enoxaparin (LOVENOX) 40 MG/0.4ML injection Inject 0.4 mLs (40 mg total) into the skin daily for 14 days.  Marland Kitchen lisinopril (PRINIVIL,ZESTRIL) 40 MG tablet Take 40 mg by mouth daily.  Marland Kitchen LORazepam (ATIVAN) 0.5 MG tablet Take 0.5 mg by mouth 2 (two) times daily as needed for anxiety.  . metoprolol succinate (TOPROL-XL) 50 MG 24 hr tablet Take 50 mg by mouth daily. Take with or immediately following a meal.  . nicotine (NICODERM CQ - DOSED IN MG/24 HOURS) 21 mg/24hr patch Place 21 mg onto the skin daily.  Marland Kitchen oxyCODONE (OXY IR/ROXICODONE) 5 MG immediate release tablet Take 1 tablet (5 mg total) by mouth every 4 (four) hours as needed for moderate pain (pain score 4-6).  Marland Kitchen traMADol (ULTRAM) 50 MG tablet Take 1-2 tablets (50-100 mg total) by mouth every 6 (six) hours as needed for moderate pain.  . [DISCONTINUED] lisinopril (PRINIVIL,ZESTRIL) 40 MG tablet Take 1 tablet (40 mg total) by mouth daily. (Patient taking differently: Take 40 mg by mouth daily. At 1 am)  . [DISCONTINUED] LORazepam (ATIVAN) 0.5 MG tablet Take 1 tablet (0.5 mg total) by mouth 2 (two) times daily as needed for anxiety.  . [DISCONTINUED] metoprolol succinate (TOPROL-XL) 50 MG 24 hr tablet Take 1 tablet (50 mg total) by mouth daily. (Patient taking differently: Take 50 mg by mouth daily. At 1 am)   No  facility-administered encounter medications on file as of 02/10/2018.      SIGNIFICANT DIAGNOSTIC EXAMS  LABS REVIEWED:   01-19-18:wbc 86.; hgb 14.7; hct 44.2; mcv 88.8; plt 192; glucose 123 bun 15; creat 0.81; k+  3.5 na++ 141; ca 8.8 liver normal albumin 3.9  hgb a1c 6.3 chol 207; ldl 143; trig 150;  hdl 34    Review of Systems  Constitutional: Negative for malaise/fatigue.  Respiratory: Negative for cough and shortness of breath.   Cardiovascular: Negative for chest pain, palpitations and leg swelling.  Gastrointestinal: Negative for abdominal pain, constipation and heartburn.  Musculoskeletal: Negative for back pain, joint pain and myalgias.  Skin: Negative.   Neurological: Negative for dizziness.  Psychiatric/Behavioral: The patient is not nervous/anxious.     Physical Exam  Constitutional: She is oriented to person, place, and time. She appears well-developed and well-nourished. No distress.  Neck: No thyromegaly present.  Cardiovascular: Normal rate, regular rhythm, normal heart sounds and intact distal pulses.  Pulmonary/Chest: Effort normal and breath sounds normal. No respiratory distress.  Abdominal: Soft. Bowel sounds are normal. She exhibits no distension. There is no tenderness.  Musculoskeletal: She exhibits no edema.  Is able to move all extremities Is status post right knee replacement   Lymphadenopathy:    She has no cervical adenopathy.  Neurological: She is alert and oriented to person, place, and time.  Skin: Skin is warm and dry. She is not diaphoretic.  Right knee replacement without signs of infection present.   Psychiatric: She has a normal mood and affect.     ASSESSMENT/ PLAN:  TODAY:   1. Essential hypertension; is stable b/p 155/67: will continue lisinopril 40 mg daily toprol xl 50 mg daily   2. Primary osteoarthritis of both knees: is status post right knee replacement: is stable will continue therapy as directed; will follow up with  orthopedics; will continue lovenox 40 mg daily for total of 14 days; will continue oxycodone 5 mg every 6 hours as needed   3. Current tobacco use: is without change: wants her nicotine patch stopped so she can smoke; will stop the patch   4. Anxiety, mild: is stable will continue ativan 0.5 mg twice daily as needed     MD is aware of resident's narcotic use and is in agreement with current plan of care. We will attempt to wean resident as apropriate   Ok Edwards NP [facility]  Contact [phone] Monday through Friday 8am- 5pm  After hours call [phone]

## 2018-02-11 ENCOUNTER — Encounter: Payer: Self-pay | Admitting: Adult Health

## 2018-02-11 ENCOUNTER — Other Ambulatory Visit: Payer: Self-pay

## 2018-02-11 ENCOUNTER — Non-Acute Institutional Stay (SKILLED_NURSING_FACILITY): Payer: 59 | Admitting: Adult Health

## 2018-02-11 DIAGNOSIS — Z96651 Presence of right artificial knee joint: Secondary | ICD-10-CM

## 2018-02-11 DIAGNOSIS — M170 Bilateral primary osteoarthritis of knee: Secondary | ICD-10-CM | POA: Diagnosis not present

## 2018-02-11 DIAGNOSIS — I10 Essential (primary) hypertension: Secondary | ICD-10-CM | POA: Diagnosis not present

## 2018-02-11 NOTE — Progress Notes (Signed)
Location:   [place] Room Number: 206A Place of Service:  SNF (31)    CODE STATUS: FULL  Allergies  Allergen Reactions  . Ceclor [Cefaclor] Anaphylaxis and Hives  . Demerol [Meperidine] Nausea And Vomiting  . Erythromycin     EES caused jaundice like symptoms   . Flagyl [Metronidazole] Swelling  . Hydrochlorothiazide     severe cramping in her legs/knee/calves  . Hydrocodone-Acetaminophen Nausea And Vomiting  . Morphine And Related     Pt prefers to not be given morphine, tolerates hydromorphone instead   . Sulfa Antibiotics Swelling  . Penicillins Hives and Rash    Has patient had a PCN reaction causing immediate rash, facial/tongue/throat swelling, SOB or lightheadedness with hypotension: Yes Has patient had a PCN reaction causing severe rash involving mucus membranes or skin necrosis: Yes Has patient had a PCN reaction that required hospitalization: Yes Has patient had a PCN reaction occurring within the last 10 years: No If all of the above answers are "NO", then may proceed with Cephalosporin use.     Chief Complaint  Patient presents with  . Discharge Note    Discharging from SNF on Tuesday 02/15/2018    HPI:  She is being discharged to home with home health for pt/ot. She will need a rolling walker; BSC and tub bench. She will need her prescriptions written and will need to follow up with her primary medical provider. She had been hospitalized for a right knee replacement. She was admitted to this facility for short term rehab and is now ready for discharge to home.    Past Medical History:  Diagnosis Date  . Arthritis   . Family history of adverse reaction to anesthesia    brother hard to wake up after surgery  . Hypertension   . PONV (postoperative nausea and vomiting) 1980s  . Pre-diabetes     Past Surgical History:  Procedure Laterality Date  . ABDOMINAL HYSTERECTOMY  2014   Dr. Sabra Heck  . APPENDECTOMY    . HERNIA REPAIR   2014   Dr. Pat Patrick  . KNEE ARTHROPLASTY Right 01/31/2018   Procedure: COMPUTER ASSISTED TOTAL KNEE ARTHROPLASTY;  Surgeon: Dereck Leep, MD;  Location: [facility] ORS;  Service: Orthopedics;  Laterality: Right;  . KNEE SURGERY Bilateral 2007  . LIPOMA EXCISION  1999   of the left side of the neck  . NECK SURGERY    . SALPINGOOPHORECTOMY Bilateral   . TONSILLECTOMY    . TUBAL LIGATION      Social History   Socioeconomic History  . Marital status: Married    Spouse name: Not on file  . Number of children: Not on file  . Years of education: Not on file  . Highest education level: Not on file  Occupational History  . Not on file  Social Needs  . Financial resource strain: Not on file  . Food insecurity:    Worry: Not on file    Inability: Not on file  . Transportation needs:    Medical: Not on file    Non-medical: Not on file  Tobacco Use  . Smoking status: Current Every Day Smoker    Packs/day: 1.00    Years: 48.00    Pack years: 48.00    Types: Cigarettes  . Smokeless tobacco: Never Used  . Tobacco comment: smokes due to stress; has quit several times  Substance and Sexual Activity  . Alcohol use: No    Alcohol/week: 0.0 standard  drinks  . Drug use: No  . Sexual activity: Not on file  Lifestyle  . Physical activity:    Days per week: Not on file    Minutes per session: Not on file  . Stress: Not on file  Relationships  . Social connections:    Talks on phone: Not on file    Gets together: Not on file    Attends religious service: Not on file    Active member of club or organization: Not on file    Attends meetings of clubs or organizations: Not on file    Relationship status: Not on file  . Intimate partner violence:    Fear of current or ex partner: Not on file    Emotionally abused: Not on file    Physically abused: Not on file    Forced sexual activity: Not on file  Other Topics Concern  . Not on file  Social History Narrative  . Not on file   Family  History  Problem Relation Age of Onset  . Alzheimer's disease Father   . Heart disease Father   . Hypothyroidism Sister   . Hypertension Brother   . Polycystic kidney disease Brother   . Depression Sister   . Hypertension Sister   . Anxiety disorder Sister   . Stroke Mother   . Rectal cancer Brother     VITAL SIGNS BP 106/73   Pulse 62   Temp 98.2 F (36.8 C) (Oral)   Resp 20   Ht 5\' 1"  (1.549 m)   Wt 234 lb (106.1 kg)   SpO2 94%   BMI 44.21 kg/m   Patient's Medications  New Prescriptions   No medications on file  Previous Medications   CHOLECALCIFEROL 4000 UNITS CAPS    Take 1 capsule by mouth daily.   ENOXAPARIN (LOVENOX) 40 MG/0.4ML INJECTION    Inject 0.4 mLs (40 mg total) into the skin daily for 14 days.   LISINOPRIL (PRINIVIL,ZESTRIL) 40 MG TABLET    Take 40 mg by mouth daily.   LORAZEPAM (ATIVAN) 0.5 MG TABLET    Take 0.5 mg by mouth 2 (two) times daily as needed for anxiety.   METOPROLOL SUCCINATE (TOPROL-XL) 50 MG 24 HR TABLET    Take 50 mg by mouth daily. Take with or immediately following a meal.   NICOTINE (NICODERM CQ - DOSED IN MG/24 HOURS) 21 MG/24HR PATCH    Place 21 mg onto the skin daily.   OXYCODONE (OXY IR/ROXICODONE) 5 MG IMMEDIATE RELEASE TABLET    Take 1 tablet (5 mg total) by mouth every 4 (four) hours as needed for moderate pain (pain score 4-6).   TRAMADOL (ULTRAM) 50 MG TABLET    Take 1-2 tablets (50-100 mg total) by mouth every 6 (six) hours as needed for moderate pain.   UNABLE TO FIND    Diet Type: Regular  Modified Medications   No medications on file  Discontinued Medications   No medications on file     SIGNIFICANT DIAGNOSTIC EXAMS  LABS REVIEWED:   01-19-18:wbc 86.; hgb 14.7; hct 44.2; mcv 88.8; plt 192; glucose 123 bun 15; creat 0.81; k+  3.5 na++ 141; ca 8.8 liver normal albumin 3.9  hgb a1c 6.3 chol 207; ldl 143; trig 150; hdl 34   NO NEW LABS.    Review of Systems  Constitutional: Negative for malaise/fatigue.  Respiratory:  Negative for cough and shortness of breath.   Cardiovascular: Negative for chest pain, palpitations and leg swelling.  Gastrointestinal: Negative for abdominal pain, constipation and heartburn.  Musculoskeletal: Negative for back pain, joint pain and myalgias.  Skin: Negative.   Neurological: Negative for dizziness.  Psychiatric/Behavioral: The patient is not nervous/anxious.     Physical Exam  Constitutional: She is oriented to person, place, and time. She appears well-developed and well-nourished. No distress.  Neck: No thyromegaly present.  Cardiovascular: Normal rate, regular rhythm, normal heart sounds and intact distal pulses.  Pulmonary/Chest: Effort normal and breath sounds normal. No respiratory distress.  Abdominal: Soft. Bowel sounds are normal. She exhibits no distension. There is no tenderness.  Musculoskeletal: She exhibits no edema.  Is able to move all extremities Is status post right knee replacement    Lymphadenopathy:    She has no cervical adenopathy.  Neurological: She is alert and oriented to person, place, and time.  Skin: Skin is warm and dry. She is not diaphoretic.  Right knee replacement without signs of infection present.    Psychiatric: She has a normal mood and affect.     ASSESSMENT/ PLAN:  Patient is being discharged with the following home health services:  Pt/ot: to evaluate and treat as indicated for gait balance strength adl training.   Patient is being discharged with the following durable medical equipment:  BSC tub bench. Front wheel walker to allow her to maintain her current level of independence with her adls.   Patient has been advised to f/u with their PCP in 1-2 weeks to bring them up to date on their rehab stay.  Social services at facility was responsible for arranging this appointment.  Pt was provided with a 30 day supply of prescriptions for medications and refills must be obtained from their PCP.  For controlled substances, a more  limited supply may be provided adequate until PCP appointment only.  A 30 day supply of her prescriptions have been written as listed above #10 oxycodone 5 mg tabs !0 ultram 50 mg tabs #10 ativan 0.5 mg tabs.   Time spent with patient 35 minutes: discussed medications; home health needs and dme; verbalized understanding.    Ok Edwards NP [facility]  Contact [phone] Monday through Friday 8am- 5pm  After hours call [phone]

## 2018-02-11 NOTE — Telephone Encounter (Signed)
Patient will receive prescriptions upon discharge on Tuesday 02/15/2018. Medications written are Tramadol 50 mg tab (1 to 2 tab po q6h prn. Total tabs 10), ativan 0.5 mg (1 tab po bid total tabs 10), and Oxycodone 5 mg (1 tab po q4h prn total tabs 10.

## 2018-02-15 ENCOUNTER — Ambulatory Visit: Payer: Self-pay | Admitting: Family Medicine

## 2018-02-15 DIAGNOSIS — Z96651 Presence of right artificial knee joint: Secondary | ICD-10-CM | POA: Diagnosis not present

## 2018-02-16 ENCOUNTER — Other Ambulatory Visit: Payer: Self-pay | Admitting: *Deleted

## 2018-02-16 NOTE — Patient Outreach (Addendum)
[facility]) Care Management  02/16/2018  Ruth Higgins [birth year] [id number]   Subjective: Telephone call to patient's mobile number, no answer, left HIPAA compliant voicemail message, and requested call back.    Objective:Per KPN (Knowledge Performance Now, point of care tool) and chart review,patient hospitalized 01/31/18 -02/02/18 forPRIMARY OSTEOARTHRITIS OF RIGHT KNEE, status postRighttotal knee arthroplasty using computer-assisted navigation, on 01/31/18. Patient has a history of hypertension, Hyperglycemia,HLD (hyperlipidemia), andPre-diabetes.Patient discharged [place] on 02/02/18.       Assessment: Received UMR Preoperative / Transition of care referral on 01/19/18. Transition of care follow up pending notification of patient discharge.     Plan:RNCM will call patient for  telephone outreach attempt, transition of care follow up, within 3 business days of rehab discharge notification.     Jedd Schulenburg H. Annia Friendly, BSN, [place] Management [facility] Telephonic CM Phone: [phone] Fax: [phone]

## 2018-02-17 DIAGNOSIS — Z96651 Presence of right artificial knee joint: Secondary | ICD-10-CM | POA: Diagnosis not present

## 2018-02-21 ENCOUNTER — Other Ambulatory Visit: Payer: 59 | Admitting: *Deleted

## 2018-02-21 DIAGNOSIS — Z96651 Presence of right artificial knee joint: Secondary | ICD-10-CM | POA: Diagnosis not present

## 2018-02-21 NOTE — Patient Outreach (Signed)
[facility]) Care Management  02/21/2018  Ruth Higgins Dec 25, 1955 [id number]   Subjective: Telephone call to patient's mobile number, no answer, left HIPAA compliant voicemail message, and requested call back.    Objective:Per KPN (Knowledge Performance Now, point of care tool) and chart review,patient hospitalized 01/31/18 -02/02/18 forPRIMARY OSTEOARTHRITIS OF RIGHT KNEE, status postRighttotal knee arthroplasty using computer-assisted navigation, on 01/31/18. Patient has a history of hypertension, Hyperglycemia,HLD (hyperlipidemia), andPre-diabetes.Patient discharged [place] (SNF) on 02/02/18.  Patient discharged from SNF to home with home health on 02/15/18.       Assessment: Received UMR Preoperative / Transition of care referral on 01/19/18. Transition of care follow up pending patient contact.     Plan:RNCM has sent unsuccessful outreach  letter, [facility] pamphlet, will call patient for 3rd telephone outreach attempt, transition of care follow up, and proceed with case closure, within 10 business days if no return call.      [place] Kirchner H. Annia Friendly, BSN, [place] Management [facility] Telephonic CM Phone: [phone] Fax: [phone]

## 2018-02-22 ENCOUNTER — Other Ambulatory Visit: Payer: Self-pay | Admitting: *Deleted

## 2018-02-22 NOTE — Patient Outreach (Signed)
[facility]) Care Management  02/22/2018  Ruth Higgins 1956-06-07 [id number]   Subjective: Telephone call to patient's mobile number, no answer, left HIPAA compliant voicemail message, and requested call back.    Objective:Per KPN (Knowledge Performance Now, point of care tool) and chart review,patient hospitalized 01/31/18 -02/02/18 forPRIMARY OSTEOARTHRITIS OF RIGHT KNEE, status postRighttotal knee arthroplasty using computer-assisted navigation, on 01/31/18. Patient has a history of hypertension, Hyperglycemia,HLD (hyperlipidemia), andPre-diabetes.Patient [facility] ([facility] on 02/02/18.  Patient discharged from SNF to home with home health on 02/15/18.       Assessment: Received UMR Preoperative / Transition of care referral on 01/19/18. Transition of care follow up pending patient contact.     Plan:RNCM has sent unsuccessful outreach  letter, [facility] pamphlet, and will proceed with case closure, within 10 business days if no return call.     Jann Milkovich H. Annia Friendly, BSN, [place] Management [facility] Telephonic CM Phone: [phone] Fax: [phone]

## 2018-02-23 ENCOUNTER — Encounter: Payer: Self-pay | Admitting: *Deleted

## 2018-02-23 ENCOUNTER — Other Ambulatory Visit: Payer: Self-pay | Admitting: *Deleted

## 2018-02-23 NOTE — Patient Outreach (Signed)
[facility]) Care Management  02/23/2018  Ruth Higgins 1955/07/16 [id number]   Subjective: Received voicemail from Zack Seal, states she is returning call, and requested call back. Telephone call to patient's mobile number, spoke with patient, and HIPAA verified.  Discussed [facility] Care Management UMR Transition of care follow up, patient voiced understanding, and is in agreement to follow up.  Patient states she remembers speaking with this RNCM in the past.   States she is doing great, doing better each day, was discharged from skilled nursing rehab facility on 02/15/18, staying with sister (who is an Therapist, sports) for the next 2 weeks, wear TED hose as prescribed, utilizing walker, and has been attending outpatient physical therapy at surgeon office 2 times a week [facility]).   States outpatient therapy is going well, does not have to pay a copay, and is utilizing this facility due to easier access to her surgeon if needed.   States pain management is going well and only takes pain medications on days that she is having therapy.  States she received excellent care at the skilled nursing rehab facility [facility]), facility was great, and she would highly recommend it to others having her same type of surgery.  States she has a follow up appointment with surgeon on 02/15/18, staples removed, and has another follow up appointment with surgeon on 03/31/18.   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  States she is working with surgeon's office to obtain the hospital bed, aware that this may be an issue for insurance approval if not deemed medically necessary, and will pay out of pocket if needed,  or make other arrangements to obtain bed.  States no assistance needed from this [facility] for hospital bed at this time and voices understanding regarding medical necessity.    Patient states her blood pressure was very low postoperatively for a few days, has increased to her baseline while in rehab,  blood pressure 145/85 at her surgeon's appointment on 02/15/18,  it is currently being managed by surgeon's office, MD not sure if elevation is due pain or true hypertension, and will continue to monitor.  States she does not take blood pressure at home due to anxiety, was told to stop taking it so frequently, MD's will monitor during office visits, and as needed.   Patient states she is able to manage self care and has assistance as needed with activities of daily living/ home management.  Patient voices understanding of medical diagnosis, surgery, and treatment plan.  States she spoke with [facility] [facility], answered all of her questions, and will follow up with short term disability  vendor regarding benefit end date (currently  03/28/18, does not have next MD follow up appointment until 03/31/18).  [facility] benefits discussed on 02/04/18 follow up call, has family medical leave act in place,  and patient states no additional questions at this time.   States she followed up, verified that she does have the hospital indemnity, has accident supplemental policy,  will file claim as appropriate,  is very appreciative of this RNCM mentioning this benefit that she had forgotten she had, and all the information she received  was very helpful.  Patient states she does not have any education material, transition of care, care coordination, disease management, disease monitoring, transportation, community resource, or pharmacy needs at this time. States she is very appreciative of the follow up and is in agreement to receive [place] Management information.      Objective:Per KPN (Knowledge Performance Now, point of care  tool) and chart review,patient hospitalized 01/31/18 -02/02/18 forPRIMARY OSTEOARTHRITIS OF RIGHT KNEE, status postRighttotal knee arthroplasty using computer-assisted navigation, on 01/31/18. Patient has a history of hypertension, Hyperglycemia,HLD (hyperlipidemia), andPre-diabetes.Patient discharged [place] (SNF)on 02/02/18. Patient discharged fromSNF to home with home health on 02/15/18.     Assessment: Received UMR Preoperative / Transition of care referral on 01/19/18. Transition of care follow up completed, no care management needs, and will proceed with case closure.      Plan:RNCM will send patient successful outreach letter, [facility] pamphlet, and magnet. RNCM will complete case closure due to follow up completed / no care management needs.      Merit Maybee H. Annia Friendly, BSN, [place] Management [facility] Telephonic CM Phone: [phone] Fax: [phone]

## 2018-02-24 DIAGNOSIS — M25561 Pain in right knee: Secondary | ICD-10-CM | POA: Diagnosis not present

## 2018-02-24 DIAGNOSIS — Z96651 Presence of right artificial knee joint: Secondary | ICD-10-CM | POA: Diagnosis not present

## 2018-02-28 DIAGNOSIS — M25561 Pain in right knee: Secondary | ICD-10-CM | POA: Diagnosis not present

## 2018-02-28 DIAGNOSIS — Z96651 Presence of right artificial knee joint: Secondary | ICD-10-CM | POA: Diagnosis not present

## 2018-03-03 DIAGNOSIS — Z760 Encounter for issue of repeat prescription: Secondary | ICD-10-CM | POA: Diagnosis not present

## 2018-03-03 DIAGNOSIS — Z96651 Presence of right artificial knee joint: Secondary | ICD-10-CM | POA: Diagnosis not present

## 2018-03-03 DIAGNOSIS — M25561 Pain in right knee: Secondary | ICD-10-CM | POA: Diagnosis not present

## 2018-03-09 DIAGNOSIS — Z96651 Presence of right artificial knee joint: Secondary | ICD-10-CM | POA: Diagnosis not present

## 2018-03-11 DIAGNOSIS — Z96651 Presence of right artificial knee joint: Secondary | ICD-10-CM | POA: Diagnosis not present

## 2018-03-15 DIAGNOSIS — Z760 Encounter for issue of repeat prescription: Secondary | ICD-10-CM | POA: Diagnosis not present

## 2018-03-15 DIAGNOSIS — Z96651 Presence of right artificial knee joint: Secondary | ICD-10-CM | POA: Diagnosis not present

## 2018-03-17 DIAGNOSIS — Z96651 Presence of right artificial knee joint: Secondary | ICD-10-CM | POA: Diagnosis not present

## 2018-03-21 DIAGNOSIS — Z96651 Presence of right artificial knee joint: Secondary | ICD-10-CM | POA: Diagnosis not present

## 2018-03-23 DIAGNOSIS — M25561 Pain in right knee: Secondary | ICD-10-CM | POA: Diagnosis not present

## 2018-03-23 DIAGNOSIS — Z96651 Presence of right artificial knee joint: Secondary | ICD-10-CM | POA: Diagnosis not present

## 2018-03-28 DIAGNOSIS — M25561 Pain in right knee: Secondary | ICD-10-CM | POA: Diagnosis not present

## 2018-03-28 DIAGNOSIS — Z96651 Presence of right artificial knee joint: Secondary | ICD-10-CM | POA: Diagnosis not present

## 2018-03-30 DIAGNOSIS — Z96651 Presence of right artificial knee joint: Secondary | ICD-10-CM | POA: Diagnosis not present

## 2018-03-30 DIAGNOSIS — M25561 Pain in right knee: Secondary | ICD-10-CM | POA: Diagnosis not present

## 2018-03-31 DIAGNOSIS — Z96651 Presence of right artificial knee joint: Secondary | ICD-10-CM | POA: Diagnosis not present

## 2018-04-04 DIAGNOSIS — M25561 Pain in right knee: Secondary | ICD-10-CM | POA: Diagnosis not present

## 2018-04-04 DIAGNOSIS — Z96651 Presence of right artificial knee joint: Secondary | ICD-10-CM | POA: Diagnosis not present

## 2018-04-06 DIAGNOSIS — Z96651 Presence of right artificial knee joint: Secondary | ICD-10-CM | POA: Diagnosis not present

## 2018-04-06 DIAGNOSIS — M25561 Pain in right knee: Secondary | ICD-10-CM | POA: Diagnosis not present

## 2018-04-08 DIAGNOSIS — M25561 Pain in right knee: Secondary | ICD-10-CM | POA: Diagnosis not present

## 2018-04-08 DIAGNOSIS — Z96651 Presence of right artificial knee joint: Secondary | ICD-10-CM | POA: Diagnosis not present

## 2018-04-11 DIAGNOSIS — Z96651 Presence of right artificial knee joint: Secondary | ICD-10-CM | POA: Diagnosis not present

## 2018-04-13 DIAGNOSIS — Z96651 Presence of right artificial knee joint: Secondary | ICD-10-CM | POA: Diagnosis not present

## 2018-05-10 DIAGNOSIS — Z96651 Presence of right artificial knee joint: Secondary | ICD-10-CM | POA: Diagnosis not present

## 2018-05-10 DIAGNOSIS — M1712 Unilateral primary osteoarthritis, left knee: Secondary | ICD-10-CM | POA: Diagnosis not present

## 2018-05-31 ENCOUNTER — Ambulatory Visit: Payer: 59 | Admitting: Family Medicine

## 2018-05-31 ENCOUNTER — Encounter: Payer: Self-pay | Admitting: Family Medicine

## 2018-05-31 VITALS — BP 150/92 | HR 69 | Temp 97.8°F | Resp 18 | Wt 229.0 lb

## 2018-05-31 DIAGNOSIS — M170 Bilateral primary osteoarthritis of knee: Secondary | ICD-10-CM

## 2018-05-31 DIAGNOSIS — R7303 Prediabetes: Secondary | ICD-10-CM | POA: Diagnosis not present

## 2018-05-31 DIAGNOSIS — Z720 Tobacco use: Secondary | ICD-10-CM | POA: Diagnosis not present

## 2018-05-31 DIAGNOSIS — E559 Vitamin D deficiency, unspecified: Secondary | ICD-10-CM | POA: Diagnosis not present

## 2018-05-31 DIAGNOSIS — Z6841 Body Mass Index (BMI) 40.0 and over, adult: Secondary | ICD-10-CM | POA: Diagnosis not present

## 2018-05-31 DIAGNOSIS — I10 Essential (primary) hypertension: Secondary | ICD-10-CM

## 2018-05-31 NOTE — Progress Notes (Signed)
Patient: Ruth Higgins Female    DOB: 12/29/1955   62 y.o.   MRN: [id number] Visit Date: 05/31/2018  Today's Provider: [name], MD   Chief Complaint  Patient presents with  . Follow-up   Subjective:    HPI  Hyperglycemia, Follow-up:   Lab Results  Component Value Date   HGBA1C 6.3 (H) 01/19/2018   HGBA1C 6.3 (H) 05/24/2017   HGBA1C 6.3 (H) 12/22/2016    Last seen for diabetes 6 months ago (seen by Dr. Brita Romp).  Management since then includes discussing low carb diet, diet and exercise. She reports good compliance with treatment. She is not having side effects.  Current symptoms include none and have been stable.  Weight trend: fluctuating a bit Prior visit with dietician: no Current diet: well balanced Current exercise: rehabilitation  Pertinent Labs:    Component Value Date/Time   CHOL 207 (H) 01/19/2018 0831   TRIG 150 (H) 01/19/2018 0831   HDL 34 (L) 01/19/2018 0831   LDLCALC 143 (H) 01/19/2018 0831   CREATININE 0.81 01/19/2018 0831   CREATININE 0.96 02/25/2013 1746    Wt Readings from Last 3 Encounters:  05/31/18 229 lb (103.9 kg)  02/11/18 234 lb (106.1 kg)  02/10/18 234 lb (106.1 kg)    ------------------------------------------------------------------------  Lipid/Cholesterol, Follow-up:   Last seen for this 6 months ago.  Management changes since that visit include discussing lifestyle modifications. . Last Lipid Panel:    Component Value Date/Time   CHOL 207 (H) 01/19/2018 0831   TRIG 150 (H) 01/19/2018 0831   HDL 34 (L) 01/19/2018 0831   CHOLHDL 6.1 01/19/2018 0831   VLDL 30 01/19/2018 0831   LDLCALC 143 (H) 01/19/2018 0831    Risk factors for vascular disease include diabetes mellitus and hypercholesterolemia  She reports good compliance with treatment. She is not having side effects.  Current symptoms include none and have been stable. Weight trend: fluctuating a bit Prior visit with dietician:  no Current diet: well balanced Current exercise: rehabilitation  Wt Readings from Last 3 Encounters:  05/31/18 229 lb (103.9 kg)  02/11/18 234 lb (106.1 kg)  02/10/18 234 lb (106.1 kg)    ------------------------------------------------------------------- Vitamin D Deficiency: Patient reports good compliance with treatment. She would prefer to restart  Prescription Vitamin D 50,000 units weekly instead of taking OTC Vitamin D.    Hypertension, follow-up:  BP Readings from Last 3 Encounters:  05/31/18 (!) 150/92  02/11/18 106/73  02/10/18 (!) 155/67    She was last seen for hypertension 6 months ago.  She reports good compliance with treatment. She is not having side effects.  She is exercising (rehahilitation). She is adherent to low salt diet.   Outside blood pressures are not being checked. She is experiencing lower extremity edema.  Patient denies chest pain, chest pressure/discomfort, claudication, dyspnea, exertional chest pressure/discomfort, fatigue, irregular heart beat, near-syncope, orthopnea, palpitations, paroxysmal nocturnal dyspnea, syncope and tachypnea.   Cardiovascular risk factors include dyslipidemia and hypertension.  Use of agents associated with hypertension: none.     Weight trend: fluctuating a bit Wt Readings from Last 3 Encounters:  05/31/18 229 lb (103.9 kg)  02/11/18 234 lb (106.1 kg)  02/10/18 234 lb (106.1 kg)    Current diet: well balanced,not losing weight. Status post right total knee replacement.  Left total knee replacement is scheduled on December 30. He continues to smoke.   ------------------------------------------------------------------------     Allergies  Allergen Reactions  . Ceclor [Cefaclor]  Anaphylaxis and Hives  . Demerol [Meperidine] Nausea And Vomiting  . Erythromycin     EES caused jaundice like symptoms   . Flagyl [Metronidazole] Swelling  . Hydrochlorothiazide     severe cramping in her legs/knee/calves  .  Hydrocodone-Acetaminophen Nausea And Vomiting  . Morphine And Related     Pt prefers to not be given morphine, tolerates hydromorphone instead   . Sulfa Antibiotics Swelling  . Penicillins Hives and Rash    Has patient had a PCN reaction causing immediate rash, facial/tongue/throat swelling, SOB or lightheadedness with hypotension: Yes Has patient had a PCN reaction causing severe rash involving mucus membranes or skin necrosis: Yes Has patient had a PCN reaction that required hospitalization: Yes Has patient had a PCN reaction occurring within the last 10 years: No If all of the above answers are "NO", then may proceed with Cephalosporin use.      Current Outpatient Medications:  .  Cholecalciferol 4000 units CAPS, Take 1 capsule by mouth daily., Disp: , Rfl:  .  lisinopril (PRINIVIL,ZESTRIL) 40 MG tablet, Take 40 mg by mouth daily., Disp: , Rfl:  .  metoprolol succinate (TOPROL-XL) 50 MG 24 hr tablet, Take 50 mg by mouth daily. Take with or immediately following a meal., Disp: , Rfl:  .  nicotine (NICODERM CQ - DOSED IN MG/24 HOURS) 21 mg/24hr patch, Place 21 mg onto the skin daily., Disp: , Rfl:  .  UNABLE TO FIND, Diet Type: Regular, Disp: , Rfl:   Review of Systems  Constitutional: Negative for appetite change, chills, fatigue and fever.  HENT: Negative.   Eyes: Negative.   Respiratory: Negative for chest tightness and shortness of breath.   Cardiovascular: Positive for leg swelling. Negative for chest pain and palpitations.  Gastrointestinal: Negative for abdominal pain, nausea and vomiting.  Endocrine: Negative.   Musculoskeletal: Positive for arthralgias and joint swelling.  Allergic/Immunologic: Negative.   Neurological: Positive for numbness (right lower leg). Negative for dizziness and weakness.  Psychiatric/Behavioral: Negative.     Social History   Tobacco Use  . Smoking status: Current Every Day Smoker    Packs/day: 1.00    Years: 48.00    Pack years: 48.00     Types: Cigarettes  . Smokeless tobacco: Never Used  . Tobacco comment: smokes due to stress; has quit several times  Substance Use Topics  . Alcohol use: No    Alcohol/week: 0.0 standard drinks   Objective:   BP (!) 150/92 (BP Location: Right Arm, Patient Position: Sitting, Cuff Size: Large)   Pulse 69   Temp 97.8 F (36.6 C) (Oral)   Resp 18   Wt 229 lb (103.9 kg)   SpO2 98% Comment: room air  BMI 43.27 kg/m  Vitals:   05/31/18 1522  BP: (!) 150/92  Pulse: 69  Resp: 18  Temp: 97.8 F (36.6 C)  TempSrc: Oral  SpO2: 98%  Weight: 229 lb (103.9 kg)     Physical Exam  Constitutional: She is oriented to person, place, and time. She appears well-developed and well-nourished.  HENT:  Head: Normocephalic and atraumatic.  Eyes: Conjunctivae are normal. No scleral icterus.  Neck: No thyromegaly present.  Cardiovascular: Normal rate, regular rhythm and normal heart sounds.  Pulmonary/Chest: Effort normal and breath sounds normal.  Abdominal: Soft.  Musculoskeletal: She exhibits no edema.  Neurological: She is alert and oriented to person, place, and time.  Skin: Skin is warm and dry.  Psychiatric: She has a normal mood and affect. Her behavior  is normal. Judgment and thought content normal.        Assessment & Plan:     1. Essential (primary) hypertension   2. Pre-diabetes  - Hemoglobin A1c  3. Vitamin D deficiency Continue Vit D Rx as last level was 8. - VITAMIN D 25 Hydroxy (Vit-D Deficiency, Fractures)  4. Primary osteoarthritis of both knees   5. Class 3 severe obesity with serious comorbidity and body mass index (BMI) of 40.0 to 44.9 in adult, unspecified obesity type ([place]) -Arthritis./HTN  6. Current tobacco use Has been refilled for now to try to help cut back on smoking with anxiety.  Plan to cut back on this also.      I have done the exam and reviewed the above chart and it is accurate to the best of my knowledge. Development worker, community has been used  in this note in any air is in the dictation or transcription are unintentional.  [name], MD  [place]

## 2018-06-03 NOTE — Discharge Instructions (Signed)
°  Instructions after Total Knee Replacement ° ° Kymberly Blomberg P. Fidelia Cathers, Jr., M.D.    ° Dept. of Orthopaedics & Sports Medicine ° [facility] ° [address] ° [place], [place]  [address] ° Phone: [phone]   Fax: [phone] ° °  °DIET: °• Drink plenty of non-alcoholic fluids. °• Resume your normal diet. Include foods high in fiber. ° °ACTIVITY:  °• You may use crutches or a walker with weight-bearing as tolerated, unless instructed otherwise. °• You may be weaned off of the walker or crutches by your Physical Therapist.  °• Do NOT place pillows under the knee. Anything placed under the knee could limit your ability to straighten the knee.   °• Continue doing gentle exercises. Exercising will reduce the pain and swelling, increase motion, and prevent muscle weakness.   °• Please continue to use the TED compression stockings for 6 weeks. You may remove the stockings at night, but should reapply them in the morning. °• Do not drive or operate any equipment until instructed. ° °WOUND CARE:  °• Continue to use the PolarCare or ice packs periodically to reduce pain and swelling. °• You may bathe or shower after the staples are removed at the first office visit following surgery. ° °MEDICATIONS: °• You may resume your regular medications. °• Please take the pain medication as prescribed on the medication. °• Do not take pain medication on an empty stomach. °• You have been given a prescription for a blood thinner (Lovenox or Coumadin). Please take the medication as instructed. (NOTE: After completing a 2 week course of Lovenox, take one Enteric-coated aspirin once a day. This along with elevation will help reduce the possibility of phlebitis in your operated leg.) °• Do not drive or drink alcoholic beverages when taking pain medications. ° °CALL THE OFFICE FOR: °• Temperature above 101 degrees °• Excessive bleeding or drainage on the dressing. °• Excessive swelling, coldness, or paleness of the toes. °• Persistent  nausea and vomiting. ° °FOLLOW-UP:  °• You should have an appointment to return to the office in 10-14 days after surgery. °• Arrangements have been made for continuation of Physical Therapy (either home therapy or outpatient therapy). °  °

## 2018-06-21 ENCOUNTER — Other Ambulatory Visit: Payer: Self-pay

## 2018-06-21 ENCOUNTER — Encounter
Admission: RE | Admit: 2018-06-21 | Discharge: 2018-06-21 | Disposition: A | Discharge status: Home / Self Care | Payer: 59 | Source: Ambulatory Visit | Attending: Orthopedic Surgery | Admitting: Orthopedic Surgery

## 2018-06-21 DIAGNOSIS — Z01812 Encounter for preprocedural laboratory examination: Secondary | ICD-10-CM | POA: Insufficient documentation

## 2018-06-21 LAB — PROTIME-INR
INR: 0.9
Prothrombin Time: 12.1 seconds (ref 11.4–15.2)

## 2018-06-21 LAB — CBC
HEMATOCRIT: 42.4 % (ref 36.0–46.0)
Hemoglobin: 13.4 g/dL (ref 12.0–15.0)
MCH: 27.5 pg (ref 26.0–34.0)
MCHC: 31.6 g/dL (ref 30.0–36.0)
MCV: 87.1 fL (ref 80.0–100.0)
Platelets: 248 10*3/uL (ref 150–400)
RBC: 4.87 MIL/uL (ref 3.87–5.11)
RDW: 14.7 % (ref 11.5–15.5)
WBC: 8.3 10*3/uL (ref 4.0–10.5)
nRBC: 0 % (ref 0.0–0.2)

## 2018-06-21 LAB — COMPREHENSIVE METABOLIC PANEL
ALT: 17 U/L (ref 0–44)
AST: 15 U/L (ref 15–41)
Albumin: 3.8 g/dL (ref 3.5–5.0)
Alkaline Phosphatase: 73 U/L (ref 38–126)
Anion gap: 7 (ref 5–15)
BUN: 17 mg/dL (ref 8–23)
CO2: 26 mmol/L (ref 22–32)
CREATININE: 0.96 mg/dL (ref 0.44–1.00)
Calcium: 8.7 mg/dL — ABNORMAL LOW (ref 8.9–10.3)
Chloride: 107 mmol/L (ref 98–111)
GFR calc Af Amer: >60 mL/min (ref >60)
GFR calc non Af Amer: >60 mL/min (ref >60)
GLUCOSE: 111 mg/dL — AB (ref 70–99)
Potassium: 3.5 mmol/L (ref 3.5–5.1)
Sodium: 140 mmol/L (ref 135–145)
Total Bilirubin: 0.7 mg/dL (ref 0.3–1.2)
Total Protein: 7.1 g/dL (ref 6.5–8.1)

## 2018-06-21 LAB — SEDIMENTATION RATE: SED RATE: 14 mm/h (ref 0–30)

## 2018-06-21 LAB — URINALYSIS, ROUTINE W REFLEX MICROSCOPIC
Bilirubin Urine: NEGATIVE
Glucose, UA: NEGATIVE mg/dL
Hgb urine dipstick: NEGATIVE
Ketones, ur: NEGATIVE mg/dL
LEUKOCYTES UA: NEGATIVE
NITRITE: NEGATIVE
Protein, ur: NEGATIVE mg/dL
Specific Gravity, Urine: 1.018 (ref 1.005–1.030)
pH: 6 (ref 5.0–8.0)

## 2018-06-21 LAB — TYPE AND SCREEN
ABO/RH(D): B NEG
Antibody Screen: NEGATIVE

## 2018-06-21 LAB — HEMOGLOBIN A1C
Hgb A1c MFr Bld: 6.4 % — ABNORMAL HIGH (ref 4.8–5.6)
Mean Plasma Glucose: 136.98 mg/dL

## 2018-06-21 LAB — C-REACTIVE PROTEIN: CRP: <0.8 mg/dL (ref <1.0)

## 2018-06-21 LAB — APTT: aPTT: 31 seconds (ref 24–36)

## 2018-06-21 LAB — SURGICAL PCR SCREEN
MRSA, PCR: NEGATIVE
Staphylococcus aureus: NEGATIVE

## 2018-06-21 NOTE — Patient Instructions (Signed)
Your procedure is scheduled on: July 04, 2018 Monday  Report to Day Surgery on the 2nd floor of the [facility]. To find out your arrival time, please call [phone] between 1PM - 3PM on: Friday July 01, 2018   REMEMBER: Instructions that are not followed completely may result in serious medical risk, up to and including death; or upon the discretion of your surgeon and anesthesiologist your surgery may need to be rescheduled.  Do not eat food after midnight the night before surgery.  No gum chewing, lozengers or hard candies.  You may however, drink CLEAR liquids up to 2 hours before you are scheduled to arrive for your surgery. Do not drink anything within 2 hours of the start of your surgery.  Clear liquids include: - water  - apple juice without pulp -  CLEAR gatorade - black coffee or tea (Do NOT add milk or creamers to the coffee or tea) Do NOT drink anything that is not on this list.  Type 1 and Type 2 diabetics should only drink water.  No Alcohol for 24 hours before or after surgery.  No Smoking including e-cigarettes for 24 hours prior to surgery.  No chewable tobacco products for at least 6 hours prior to surgery.  No nicotine patches on the day of surgery.  On the morning of surgery brush your teeth with toothpaste and water, you may rinse your mouth with mouthwash if you wish. Do not swallow any toothpaste or mouthwash.  Notify your doctor if there is any change in your medical condition (cold, fever, infection).  Do not wear jewelry, make-up, hairpins, clips or nail polish.  Do not wear lotions, powders, or perfumes.   Do not shave 48 hours prior to surgery.   Contacts and dentures may not be worn into surgery.  Do not bring valuables to the hospital, including drivers license, insurance or credit cards.  [facility] is not responsible for any belongings or valuables.   TAKE THESE MEDICATIONS THE MORNING OF SURGERY: METOPROLOL   Use CHG Soap  as directed on instruction sheet.  Follow recommendations from Cardiologist, Pulmonologist or PCP regarding stopping Aspirin, Coumadin, Plavix, Eliquis, Pradaxa, or Pletal.  Stop Anti-inflammatories (NSAIDS) such as Advil, Aleve, Ibuprofen, Motrin, Naproxen, Naprosyn and Aspirin based products such as Excedrin, Goodys Powder, BC Powder. FOR ONE WEEK BEFORE SURGERY  (May take Tylenol or Acetaminophen if needed.)  Stop ANY OVER THE COUNTER supplements until after surgery. (May continue Vitamin D, Vitamin B, and multivitamin.)  Wear comfortable clothing (specific to your surgery type) to the hospital.  Plan for stool softeners for home use.  If you are being admitted to the hospital overnight, leave your suitcase in the car. After surgery it may be brought to your room.  If you are being discharged the day of surgery, you will not be allowed to drive home. You will need a responsible adult to drive you home and stay with you that night.   If you are taking public transportation, you will need to have a responsible adult with you. Please confirm with your physician that it is acceptable to use public transportation.   Please call [phone] if you have any questions about these instructions.

## 2018-06-22 LAB — URINE CULTURE
Culture: NO GROWTH
Special Requests: NORMAL

## 2018-06-22 LAB — VITAMIN D 25 HYDROXY (VIT D DEFICIENCY, FRACTURES): Vit D, 25-Hydroxy: 12.7 ng/mL — ABNORMAL LOW (ref 30.0–100.0)

## 2018-06-23 NOTE — Pre-Procedure Instructions (Signed)
LAB/VIT D FAXED TO DR Rosanna Randy

## 2018-06-27 ENCOUNTER — Telehealth: Payer: Self-pay

## 2018-06-27 NOTE — Telephone Encounter (Signed)
Please advise 

## 2018-06-27 NOTE — Telephone Encounter (Signed)
-----   Message from Jerrol Banana., MD sent at 06/27/2018  8:48 AM EST ----- Borderline diabetes.  Continue vitamin D.

## 2018-06-27 NOTE — Telephone Encounter (Signed)
Patient advised.   Patient is requesting refills on Vitamin D 2, Lisinopril, Lorazepam, and Metoprolol be sent to [facility].  Patient also wanted to know if Alpha Lapoic Acid would be okay to take to help lower cholesterol and blood sugar? Please advise.  CB#[phone]

## 2018-06-27 NOTE — Telephone Encounter (Signed)
[id number] 

## 2018-07-03 MED ORDER — TRANEXAMIC ACID-NACL 1000-0.7 MG/100ML-% IV SOLN
1000.0000 mg | INTRAVENOUS | Status: DC
  Filled 2018-07-03: qty 100

## 2018-07-03 MED ORDER — CLINDAMYCIN PHOSPHATE 900 MG/50ML IV SOLN: 900.0000 mg | INTRAVENOUS | Status: DC

## 2018-07-04 ENCOUNTER — Inpatient Hospital Stay: Payer: 59 | Admitting: Anesthesiology

## 2018-07-04 ENCOUNTER — Encounter
Admission: RE | Disposition: A | Discharge status: Home Health | Payer: Self-pay | Source: Ambulatory Visit | Attending: Orthopedic Surgery

## 2018-07-04 ENCOUNTER — Encounter: Payer: Self-pay | Admitting: Orthopedic Surgery

## 2018-07-04 ENCOUNTER — Inpatient Hospital Stay
Admission: RE | Admit: 2018-07-04 | Discharge: 2018-07-06 | DRG: 470 | Disposition: A | Discharge status: Home Health | Payer: 59 | Source: Ambulatory Visit | Attending: Orthopedic Surgery | Admitting: Orthopedic Surgery

## 2018-07-04 ENCOUNTER — Other Ambulatory Visit: Payer: Self-pay

## 2018-07-04 ENCOUNTER — Inpatient Hospital Stay: Payer: 59

## 2018-07-04 DIAGNOSIS — Z8742 Personal history of other diseases of the female genital tract: Secondary | ICD-10-CM | POA: Diagnosis not present

## 2018-07-04 DIAGNOSIS — Z79899 Other long term (current) drug therapy: Secondary | ICD-10-CM | POA: Diagnosis not present

## 2018-07-04 DIAGNOSIS — Z888 Allergy status to other drugs, medicaments and biological substances status: Secondary | ICD-10-CM | POA: Diagnosis not present

## 2018-07-04 DIAGNOSIS — Z6832 Body mass index (BMI) 32.0-32.9, adult: Secondary | ICD-10-CM | POA: Diagnosis not present

## 2018-07-04 DIAGNOSIS — E669 Obesity, unspecified: Secondary | ICD-10-CM | POA: Diagnosis present

## 2018-07-04 DIAGNOSIS — Z96651 Presence of right artificial knee joint: Secondary | ICD-10-CM | POA: Diagnosis present

## 2018-07-04 DIAGNOSIS — Z881 Allergy status to other antibiotic agents status: Secondary | ICD-10-CM

## 2018-07-04 DIAGNOSIS — Z96659 Presence of unspecified artificial knee joint: Secondary | ICD-10-CM

## 2018-07-04 DIAGNOSIS — Z885 Allergy status to narcotic agent status: Secondary | ICD-10-CM

## 2018-07-04 DIAGNOSIS — Z471 Aftercare following joint replacement surgery: Secondary | ICD-10-CM | POA: Diagnosis not present

## 2018-07-04 DIAGNOSIS — Z791 Long term (current) use of non-steroidal anti-inflammatories (NSAID): Secondary | ICD-10-CM

## 2018-07-04 DIAGNOSIS — Z880 Allergy status to penicillin: Secondary | ICD-10-CM | POA: Diagnosis not present

## 2018-07-04 DIAGNOSIS — I10 Essential (primary) hypertension: Secondary | ICD-10-CM | POA: Diagnosis present

## 2018-07-04 DIAGNOSIS — Z882 Allergy status to sulfonamides status: Secondary | ICD-10-CM

## 2018-07-04 DIAGNOSIS — Z96652 Presence of left artificial knee joint: Secondary | ICD-10-CM | POA: Diagnosis not present

## 2018-07-04 DIAGNOSIS — M1712 Unilateral primary osteoarthritis, left knee: Secondary | ICD-10-CM | POA: Diagnosis not present

## 2018-07-04 HISTORY — PX: KNEE ARTHROPLASTY: SHX992

## 2018-07-04 SURGERY — ARTHROPLASTY, KNEE, TOTAL, USING IMAGELESS COMPUTER-ASSISTED NAVIGATION
Anesthesia: General | Laterality: Left

## 2018-07-04 MED ORDER — ONDANSETRON HCL 4 MG PO TABS
4.0000 mg | ORAL_TABLET | Freq: Four times a day (QID) | ORAL | Status: DC | PRN
  Administered 2018-07-04: 4 mg via ORAL
  Filled 2018-07-04: qty 1

## 2018-07-04 MED ORDER — LISINOPRIL 20 MG PO TABS
40.0000 mg | ORAL_TABLET | Freq: Every day | ORAL | Status: DC
  Filled 2018-07-04: qty 2

## 2018-07-04 MED ORDER — MIDAZOLAM HCL 2 MG/2ML IJ SOLN
INTRAMUSCULAR | Status: AC
  Filled 2018-07-04: qty 2

## 2018-07-04 MED ORDER — BISACODYL 10 MG RE SUPP
10.0000 mg | Freq: Every day | RECTAL | Status: DC | PRN
  Filled 2018-07-04: qty 1

## 2018-07-04 MED ORDER — CELECOXIB 200 MG PO CAPS
400.0000 mg | ORAL_CAPSULE | Freq: Once | ORAL | Status: AC
  Administered 2018-07-04: 400 mg via ORAL

## 2018-07-04 MED ORDER — CLINDAMYCIN PHOSPHATE 900 MG/50ML IV SOLN
INTRAVENOUS | Status: AC
  Filled 2018-07-04: qty 50

## 2018-07-04 MED ORDER — ACETAMINOPHEN 10 MG/ML IV SOLN
INTRAVENOUS | Status: DC | PRN
  Administered 2018-07-04: 1000 mg via INTRAVENOUS

## 2018-07-04 MED ORDER — METOCLOPRAMIDE HCL 10 MG PO TABS
10.0000 mg | ORAL_TABLET | Freq: Three times a day (TID) | ORAL | Status: DC
  Administered 2018-07-04 – 2018-07-06 (×5): 10 mg via ORAL
  Filled 2018-07-04 (×4): qty 1

## 2018-07-04 MED ORDER — LORAZEPAM 0.5 MG PO TABS: 0.5000 mg | ORAL_TABLET | Freq: Three times a day (TID) | ORAL | Status: DC | PRN

## 2018-07-04 MED ORDER — FENTANYL CITRATE (PF) 100 MCG/2ML IJ SOLN: 25.0000 ug | INTRAMUSCULAR | Status: DC | PRN

## 2018-07-04 MED ORDER — TRANEXAMIC ACID-NACL 1000-0.7 MG/100ML-% IV SOLN
INTRAVENOUS | Status: DC | PRN
  Administered 2018-07-04: 1000 mg via INTRAVENOUS

## 2018-07-04 MED ORDER — HYDROMORPHONE HCL 1 MG/ML IJ SOLN: 0.5000 mg | INTRAMUSCULAR | Status: DC | PRN

## 2018-07-04 MED ORDER — GABAPENTIN 300 MG PO CAPS
300.0000 mg | ORAL_CAPSULE | Freq: Every day | ORAL | Status: DC
  Administered 2018-07-04 – 2018-07-05 (×2): 300 mg via ORAL
  Filled 2018-07-04 (×2): qty 1

## 2018-07-04 MED ORDER — OXYCODONE HCL 5 MG PO TABS
5.0000 mg | ORAL_TABLET | ORAL | Status: DC | PRN
  Administered 2018-07-04 – 2018-07-06 (×6): 5 mg via ORAL
  Filled 2018-07-04 (×5): qty 1

## 2018-07-04 MED ORDER — TRAMADOL HCL 50 MG PO TABS: 50.0000 mg | ORAL_TABLET | ORAL | Status: DC | PRN

## 2018-07-04 MED ORDER — ACETAMINOPHEN 10 MG/ML IV SOLN
1000.0000 mg | Freq: Four times a day (QID) | INTRAVENOUS | Status: AC
  Administered 2018-07-05 (×2): 1000 mg via INTRAVENOUS
  Filled 2018-07-04 (×4): qty 100

## 2018-07-04 MED ORDER — CHLORHEXIDINE GLUCONATE 4 % EX LIQD: 60.0000 mL | Freq: Once | CUTANEOUS | Status: DC

## 2018-07-04 MED ORDER — BUPIVACAINE HCL (PF) 0.5 % IJ SOLN
INTRAMUSCULAR | Status: DC | PRN
  Administered 2018-07-04: 2.6 mL

## 2018-07-04 MED ORDER — ALUM & MAG HYDROXIDE-SIMETH 200-200-20 MG/5ML PO SUSP: 30.0000 mL | ORAL | Status: DC | PRN

## 2018-07-04 MED ORDER — PROPOFOL 500 MG/50ML IV EMUL
INTRAVENOUS | Status: DC | PRN
  Administered 2018-07-04: 25 ug/kg/min via INTRAVENOUS

## 2018-07-04 MED ORDER — PANTOPRAZOLE SODIUM 40 MG PO TBEC
40.0000 mg | DELAYED_RELEASE_TABLET | Freq: Two times a day (BID) | ORAL | Status: DC
  Administered 2018-07-04 – 2018-07-06 (×4): 40 mg via ORAL
  Filled 2018-07-04 (×4): qty 1

## 2018-07-04 MED ORDER — DIPHENHYDRAMINE HCL 12.5 MG/5ML PO ELIX: 12.5000 mg | ORAL_SOLUTION | ORAL | Status: DC | PRN

## 2018-07-04 MED ORDER — DEXAMETHASONE SODIUM PHOSPHATE 10 MG/ML IJ SOLN
INTRAMUSCULAR | Status: AC
  Administered 2018-07-04: 8 mg via INTRAVENOUS
  Filled 2018-07-04: qty 1

## 2018-07-04 MED ORDER — ACETAMINOPHEN 325 MG PO TABS: 325.0000 mg | ORAL_TABLET | Freq: Four times a day (QID) | ORAL | Status: DC | PRN

## 2018-07-04 MED ORDER — VITAMIN D (ERGOCALCIFEROL) 1.25 MG (50000 UNIT) PO CAPS
50000.0000 [IU] | ORAL_CAPSULE | ORAL | Status: DC
  Administered 2018-07-06: 50000 [IU] via ORAL
  Filled 2018-07-04: qty 1

## 2018-07-04 MED ORDER — CELECOXIB 200 MG PO CAPS
200.0000 mg | ORAL_CAPSULE | Freq: Two times a day (BID) | ORAL | Status: DC
  Administered 2018-07-04 – 2018-07-06 (×4): 200 mg via ORAL
  Filled 2018-07-04 (×4): qty 1

## 2018-07-04 MED ORDER — ONDANSETRON HCL 4 MG/2ML IJ SOLN
INTRAMUSCULAR | Status: AC
  Administered 2018-07-04: 4 mg via INTRAVENOUS
  Filled 2018-07-04: qty 2

## 2018-07-04 MED ORDER — SODIUM CHLORIDE 0.9 % IV SOLN
INTRAVENOUS | Status: DC | PRN
  Administered 2018-07-04: 20 ug/min via INTRAVENOUS

## 2018-07-04 MED ORDER — FENTANYL CITRATE (PF) 100 MCG/2ML IJ SOLN
INTRAMUSCULAR | Status: AC
  Filled 2018-07-04: qty 2

## 2018-07-04 MED ORDER — TETRACAINE HCL 1 % IJ SOLN
INTRAMUSCULAR | Status: DC | PRN
  Administered 2018-07-04: 4 mg via INTRASPINAL

## 2018-07-04 MED ORDER — PROPOFOL 500 MG/50ML IV EMUL
INTRAVENOUS | Status: AC
  Filled 2018-07-04: qty 100

## 2018-07-04 MED ORDER — METOCLOPRAMIDE HCL 5 MG/ML IJ SOLN: 5.0000 mg | Freq: Three times a day (TID) | INTRAMUSCULAR | Status: DC | PRN

## 2018-07-04 MED ORDER — LIDOCAINE HCL (PF) 2 % IJ SOLN
INTRAMUSCULAR | Status: AC
  Filled 2018-07-04: qty 20

## 2018-07-04 MED ORDER — FENTANYL CITRATE (PF) 100 MCG/2ML IJ SOLN
INTRAMUSCULAR | Status: DC | PRN
  Administered 2018-07-04 (×2): 100 ug via INTRAVENOUS

## 2018-07-04 MED ORDER — FAMOTIDINE 20 MG PO TABS
ORAL_TABLET | ORAL | Status: AC
  Administered 2018-07-04: 20 mg via ORAL
  Filled 2018-07-04: qty 1

## 2018-07-04 MED ORDER — SENNOSIDES-DOCUSATE SODIUM 8.6-50 MG PO TABS
1.0000 | ORAL_TABLET | Freq: Two times a day (BID) | ORAL | Status: DC
  Administered 2018-07-04 – 2018-07-06 (×4): 1 via ORAL
  Filled 2018-07-04 (×4): qty 1

## 2018-07-04 MED ORDER — ONDANSETRON HCL 4 MG/2ML IJ SOLN
4.0000 mg | Freq: Once | INTRAMUSCULAR | Status: AC
  Administered 2018-07-04: 4 mg via INTRAVENOUS

## 2018-07-04 MED ORDER — PROPOFOL 10 MG/ML IV BOLUS
INTRAVENOUS | Status: DC | PRN
  Administered 2018-07-04: 30 mg via INTRAVENOUS

## 2018-07-04 MED ORDER — MIDAZOLAM HCL 5 MG/5ML IJ SOLN
INTRAMUSCULAR | Status: DC | PRN
  Administered 2018-07-04: 2 mg via INTRAVENOUS
  Administered 2018-07-04 (×2): 1 mg via INTRAVENOUS

## 2018-07-04 MED ORDER — SODIUM CHLORIDE 0.9 % IV SOLN
INTRAVENOUS | Status: DC | PRN
  Administered 2018-07-04: 6 mL

## 2018-07-04 MED ORDER — FERROUS SULFATE 325 (65 FE) MG PO TABS
325.0000 mg | ORAL_TABLET | Freq: Two times a day (BID) | ORAL | Status: DC
  Administered 2018-07-05 – 2018-07-06 (×2): 325 mg via ORAL
  Filled 2018-07-04 (×2): qty 1

## 2018-07-04 MED ORDER — PHENYLEPHRINE HCL 10 MG/ML IJ SOLN
INTRAMUSCULAR | Status: DC | PRN
  Administered 2018-07-04: 100 ug via INTRAVENOUS

## 2018-07-04 MED ORDER — GABAPENTIN 300 MG PO CAPS
300.0000 mg | ORAL_CAPSULE | Freq: Once | ORAL | Status: AC
  Administered 2018-07-04: 300 mg via ORAL

## 2018-07-04 MED ORDER — ENOXAPARIN SODIUM 30 MG/0.3ML ~~LOC~~ SOLN
30.0000 mg | Freq: Two times a day (BID) | SUBCUTANEOUS | Status: DC
  Administered 2018-07-05 – 2018-07-06 (×3): 30 mg via SUBCUTANEOUS
  Filled 2018-07-04 (×3): qty 0.3

## 2018-07-04 MED ORDER — METOCLOPRAMIDE HCL 10 MG PO TABS: 5.0000 mg | ORAL_TABLET | Freq: Three times a day (TID) | ORAL | Status: DC | PRN

## 2018-07-04 MED ORDER — NICOTINE 21 MG/24HR TD PT24
21.0000 mg | MEDICATED_PATCH | Freq: Every day | TRANSDERMAL | Status: DC | PRN
  Administered 2018-07-05 – 2018-07-06 (×2): 21 mg via TRANSDERMAL
  Filled 2018-07-04 (×2): qty 1

## 2018-07-04 MED ORDER — FLEET ENEMA 7-19 GM/118ML RE ENEM: 1.0000 | ENEMA | Freq: Once | RECTAL | Status: DC | PRN

## 2018-07-04 MED ORDER — BUPIVACAINE HCL (PF) 0.25 % IJ SOLN
INTRAMUSCULAR | Status: DC | PRN
  Administered 2018-07-04: 60 mL

## 2018-07-04 MED ORDER — CLINDAMYCIN PHOSPHATE 900 MG/50ML IV SOLN
INTRAVENOUS | Status: DC | PRN
  Administered 2018-07-04: 900 mg via INTRAVENOUS

## 2018-07-04 MED ORDER — ONDANSETRON HCL 4 MG/2ML IJ SOLN: 4.0000 mg | Freq: Four times a day (QID) | INTRAMUSCULAR | Status: DC | PRN

## 2018-07-04 MED ORDER — CLINDAMYCIN PHOSPHATE 600 MG/50ML IV SOLN
600.0000 mg | Freq: Four times a day (QID) | INTRAVENOUS | Status: AC
  Administered 2018-07-04 – 2018-07-05 (×4): 600 mg via INTRAVENOUS
  Filled 2018-07-04 (×4): qty 50

## 2018-07-04 MED ORDER — FAMOTIDINE 20 MG PO TABS
20.0000 mg | ORAL_TABLET | Freq: Once | ORAL | Status: AC
  Administered 2018-07-04: 20 mg via ORAL

## 2018-07-04 MED ORDER — OXYCODONE HCL 5 MG PO TABS
10.0000 mg | ORAL_TABLET | ORAL | Status: DC | PRN
  Administered 2018-07-06: 10 mg via ORAL
  Filled 2018-07-04 (×2): qty 2

## 2018-07-04 MED ORDER — ACETAMINOPHEN 10 MG/ML IV SOLN
INTRAVENOUS | Status: AC
  Filled 2018-07-04: qty 100

## 2018-07-04 MED ORDER — MENTHOL 3 MG MT LOZG
1.0000 | LOZENGE | OROMUCOSAL | Status: DC | PRN
  Filled 2018-07-04: qty 9

## 2018-07-04 MED ORDER — MAGNESIUM HYDROXIDE 400 MG/5ML PO SUSP
30.0000 mL | Freq: Every day | ORAL | Status: DC
  Administered 2018-07-05: 30 mL via ORAL
  Filled 2018-07-04 (×2): qty 30

## 2018-07-04 MED ORDER — SODIUM CHLORIDE 0.9 % IV SOLN
INTRAVENOUS | Status: DC | PRN
  Administered 2018-07-04: 60 mL

## 2018-07-04 MED ORDER — LACTATED RINGERS IV SOLN
INTRAVENOUS | Status: DC
  Administered 2018-07-04 (×3): via INTRAVENOUS

## 2018-07-04 MED ORDER — TRANEXAMIC ACID-NACL 1000-0.7 MG/100ML-% IV SOLN
1000.0000 mg | Freq: Once | INTRAVENOUS | Status: AC
  Administered 2018-07-04: 1000 mg via INTRAVENOUS
  Filled 2018-07-04: qty 100

## 2018-07-04 MED ORDER — PHENOL 1.4 % MT LIQD
1.0000 | OROMUCOSAL | Status: DC | PRN
  Filled 2018-07-04: qty 177

## 2018-07-04 MED ORDER — GABAPENTIN 300 MG PO CAPS
ORAL_CAPSULE | ORAL | Status: AC
  Administered 2018-07-04: 300 mg via ORAL
  Filled 2018-07-04: qty 1

## 2018-07-04 MED ORDER — METOPROLOL SUCCINATE ER 50 MG PO TB24
50.0000 mg | ORAL_TABLET | Freq: Every day | ORAL | Status: DC
  Filled 2018-07-04: qty 1

## 2018-07-04 MED ORDER — SODIUM CHLORIDE 0.9 % IV SOLN
INTRAVENOUS | Status: DC
  Administered 2018-07-04 – 2018-07-05 (×2): via INTRAVENOUS

## 2018-07-04 MED ORDER — DEXAMETHASONE SODIUM PHOSPHATE 10 MG/ML IJ SOLN
8.0000 mg | Freq: Once | INTRAMUSCULAR | Status: AC
  Administered 2018-07-04: 8 mg via INTRAVENOUS

## 2018-07-04 MED ORDER — CELECOXIB 200 MG PO CAPS
ORAL_CAPSULE | ORAL | Status: AC
  Administered 2018-07-04: 400 mg via ORAL
  Filled 2018-07-04: qty 2

## 2018-07-04 SURGICAL SUPPLY — 71 items
ATTUNE PSFEM LTSZ5 NARCEM KNEE (Femur) ×3 IMPLANT
ATTUNE PSRP INSR SZ5 5 KNEE (Insert) ×2 IMPLANT
ATTUNE PSRP INSR SZ5 5MM KNEE (Insert) ×1 IMPLANT
BASEPLATE TIBIAL ROTATING SZ 4 (Knees) ×3 IMPLANT
BATTERY INSTRU NAVIGATION (MISCELLANEOUS) ×12 IMPLANT
BLADE SAW 70X12.5 (BLADE) ×3 IMPLANT
BLADE SAW 90X13X1.19 OSCILLAT (BLADE) ×3 IMPLANT
BLADE SAW 90X25X1.19 OSCILLAT (BLADE) ×3 IMPLANT
BONE CEMENT GENTAMICIN (Cement) ×3 IMPLANT
CANISTER SUCT 1200ML W/VALVE (MISCELLANEOUS) ×3 IMPLANT
CANISTER SUCT 3000ML PPV (MISCELLANEOUS) ×6 IMPLANT
CEMENT BONE GENTAMICIN 40 (Cement) ×1 IMPLANT
COOLER POLAR GLACIER W/PUMP (MISCELLANEOUS) ×3 IMPLANT
COVER WAND RF STERILE (DRAPES) ×3 IMPLANT
CUFF TOURN 24 STER (MISCELLANEOUS) IMPLANT
CUFF TOURN 30 STER DUAL PORT (MISCELLANEOUS) IMPLANT
DRAPE SHEET LG 3/4 BI-LAMINATE (DRAPES) ×3 IMPLANT
DRSG DERMACEA 8X12 NADH (GAUZE/BANDAGES/DRESSINGS) ×3 IMPLANT
DRSG OPSITE POSTOP 4X14 (GAUZE/BANDAGES/DRESSINGS) ×3 IMPLANT
DRSG TEGADERM 4X4.75 (GAUZE/BANDAGES/DRESSINGS) ×3 IMPLANT
DURAPREP 26ML APPLICATOR (WOUND CARE) ×6 IMPLANT
ELECT CAUTERY BLADE 6.4 (BLADE) ×3 IMPLANT
ELECT REM PT RETURN 9FT ADLT (ELECTROSURGICAL) ×3
ELECTRODE REM PT RTRN 9FT ADLT (ELECTROSURGICAL) ×1 IMPLANT
EX-PIN ORTHOLOCK NAV 4X150 (PIN) ×6 IMPLANT
GLOVE BIOGEL M STRL SZ7.5 (GLOVE) ×6 IMPLANT
GLOVE BIOGEL PI IND STRL 9 (GLOVE) ×1 IMPLANT
GLOVE BIOGEL PI INDICATOR 9 (GLOVE) ×2
GLOVE INDICATOR 8.0 STRL GRN (GLOVE) ×3 IMPLANT
GLOVE SURG SYN 9.0  PF PI (GLOVE) ×2
GLOVE SURG SYN 9.0 PF PI (GLOVE) ×1 IMPLANT
GOWN STRL REUS W/ TWL LRG LVL3 (GOWN DISPOSABLE) ×2 IMPLANT
GOWN STRL REUS W/TWL 2XL LVL3 (GOWN DISPOSABLE) ×3 IMPLANT
GOWN STRL REUS W/TWL LRG LVL3 (GOWN DISPOSABLE) ×4
HEMOVAC 400CC 10FR (MISCELLANEOUS) ×3 IMPLANT
HOLDER FOLEY CATH W/STRAP (MISCELLANEOUS) ×3 IMPLANT
HOOD PEEL AWAY FLYTE STAYCOOL (MISCELLANEOUS) ×6 IMPLANT
KIT TURNOVER KIT A (KITS) ×3 IMPLANT
KNIFE SCULPS 14X20 (INSTRUMENTS) ×3 IMPLANT
LABEL OR SOLS (LABEL) ×3 IMPLANT
NDL SAFETY ECLIPSE 18X1.5 (NEEDLE) ×1 IMPLANT
NEEDLE HYPO 18GX1.5 SHARP (NEEDLE) ×2
NEEDLE SPNL 20GX3.5 QUINCKE YW (NEEDLE) ×6 IMPLANT
NS IRRIG 500ML POUR BTL (IV SOLUTION) ×3 IMPLANT
PACK TOTAL KNEE (MISCELLANEOUS) ×3 IMPLANT
PAD WRAPON POLAR KNEE (MISCELLANEOUS) ×1 IMPLANT
PATELLA MEDIAL ATTUN 35MM KNEE (Knees) ×3 IMPLANT
PENCIL SMOKE ULTRAEVAC 22 CON (MISCELLANEOUS) ×3 IMPLANT
PIN DRILL QUICK PACK ×3 IMPLANT
PIN FIXATION 1/8DIA X 3INL (PIN) ×9 IMPLANT
PULSAVAC PLUS IRRIG FAN TIP (DISPOSABLE) ×3
SOL .9 NS 3000ML IRR  AL (IV SOLUTION) ×2
SOL .9 NS 3000ML IRR UROMATIC (IV SOLUTION) ×1 IMPLANT
SOL PREP PVP 2OZ (MISCELLANEOUS) ×3
SOLUTION PREP PVP 2OZ (MISCELLANEOUS) ×1 IMPLANT
SPONGE DRAIN TRACH 4X4 STRL 2S (GAUZE/BANDAGES/DRESSINGS) ×3 IMPLANT
STAPLER SKIN PROX 35W (STAPLE) ×3 IMPLANT
STOCKINETTE IMPERV 14X48 (MISCELLANEOUS) ×3 IMPLANT
STRAP TIBIA SHORT (MISCELLANEOUS) ×3 IMPLANT
SUCTION FRAZIER HANDLE 10FR (MISCELLANEOUS) ×2
SUCTION TUBE FRAZIER 10FR DISP (MISCELLANEOUS) ×1 IMPLANT
SUT VIC AB 0 CT1 36 (SUTURE) ×3 IMPLANT
SUT VIC AB 1 CT1 36 (SUTURE) ×6 IMPLANT
SUT VIC AB 2-0 CT2 27 (SUTURE) ×3 IMPLANT
SYR 20CC LL (SYRINGE) ×3 IMPLANT
SYR 30ML LL (SYRINGE) ×6 IMPLANT
TIP FAN IRRIG PULSAVAC PLUS (DISPOSABLE) ×1 IMPLANT
TOWEL OR 17X26 4PK STRL BLUE (TOWEL DISPOSABLE) ×3 IMPLANT
TOWER CARTRIDGE SMART MIX (DISPOSABLE) ×3 IMPLANT
TRAY FOLEY MTR SLVR 16FR STAT (SET/KITS/TRAYS/PACK) ×3 IMPLANT
WRAPON POLAR PAD KNEE (MISCELLANEOUS) ×3

## 2018-07-04 NOTE — Op Note (Signed)
OPERATIVE NOTE  DATE OF SURGERY:  07/04/2018  PATIENT NAME:  Ruth Higgins   DOB: 18-Jun-1956  MRN: [id number]  PRE-OPERATIVE DIAGNOSIS: Degenerative arthrosis of the left knee, primary  POST-OPERATIVE DIAGNOSIS:  Same  PROCEDURE:  Left total knee arthroplasty using computer-assisted navigation  SURGEON:  Marciano Sequin. M.D.  ASSISTANT:  Vance Peper, PA (present and scrubbed throughout the case, critical for assistance with exposure, retraction, instrumentation, and closure)  ANESTHESIA: spinal  ESTIMATED BLOOD LOSS: 50 mL  FLUIDS REPLACED: 2000 mL of crystalloid  TOURNIQUET TIME: 83 minutes  DRAINS: 2 medium Hemovac drains  SOFT TISSUE RELEASES: Anterior cruciate ligament, posterior cruciate ligament, deep medial collateral ligament, patellofemoral ligament  IMPLANTS UTILIZED: DePuy Attune size 5N posterior stabilized femoral component (cemented), size 4 rotating platform tibial component (cemented), 35 mm medialized dome patella (cemented), and a 5 mm stabilized rotating platform polyethylene insert.  INDICATIONS FOR SURGERY: Ruth Higgins is a 62 y.o. year old female with a long history of progressive knee pain. X-rays demonstrated severe degenerative changes in tricompartmental fashion. The patient had not seen any significant improvement despite conservative nonsurgical intervention. After discussion of the risks and benefits of surgical intervention, the patient expressed understanding of the risks benefits and agree with plans for total knee arthroplasty.   The risks, benefits, and alternatives were discussed at length including but not limited to the risks of infection, bleeding, nerve injury, stiffness, blood clots, the need for revision surgery, cardiopulmonary complications, among others, and they were willing to proceed.  PROCEDURE IN DETAIL: The patient was brought into the operating room and, after adequate spinal anesthesia was achieved, a tourniquet was  placed on the patient's upper thigh. The patient's knee and leg were cleaned and prepped with alcohol and DuraPrep and draped in the usual sterile fashion. A "timeout" was performed as per usual protocol. The lower extremity was exsanguinated using an Esmarch, and the tourniquet was inflated to 300 mmHg. An anterior longitudinal incision was made followed by a standard mid vastus approach. The deep fibers of the medial collateral ligament were elevated in a subperiosteal fashion off of the medial flare of the tibia so as to maintain a continuous soft tissue sleeve. The patella was subluxed laterally and the patellofemoral ligament was incised. Inspection of the knee demonstrated severe degenerative changes with full-thickness loss of articular cartilage. Osteophytes were debrided using a rongeur. Anterior and posterior cruciate ligaments were excised. Two 4.0 mm Schanz pins were inserted in the femur and into the tibia for attachment of the array of trackers used for computer-assisted navigation. Hip center was identified using a circumduction technique. Distal landmarks were mapped using the computer. The distal femur and proximal tibia were mapped using the computer. The distal femoral cutting guide was positioned using computer-assisted navigation so as to achieve a 5 distal valgus cut. The femur was sized and it was felt that a size 5N femoral component was appropriate. A size 5 femoral cutting guide was positioned and the anterior cut was performed and verified using the computer. This was followed by completion of the posterior and chamfer cuts. Femoral cutting guide for the central box was then positioned in the center box cut was performed.  Attention was then directed to the proximal tibia. Medial and lateral menisci were excised. The extramedullary tibial cutting guide was positioned using computer-assisted navigation so as to achieve a 0 varus-valgus alignment and 3 posterior slope. The cut was  performed and verified using the computer. The proximal tibia  was sized and it was felt that a size 4 tibial tray was appropriate. Tibial and femoral trials were inserted followed by insertion of a 5 mm polyethylene insert. This allowed for excellent mediolateral soft tissue balancing both in flexion and in full extension. Finally, the patella was cut and prepared so as to accommodate a 35 mm medialized dome patella. A patella trial was placed and the knee was placed through a range of motion with excellent patellar tracking appreciated. The femoral trial was removed after debridement of posterior osteophytes. The central post-hole for the tibial component was reamed followed by insertion of a keel punch. Tibial trials were then removed. Cut surfaces of bone were irrigated with copious amounts of normal saline with antibiotic solution using pulsatile lavage and then suctioned dry. Polymethylmethacrylate cement with gentamicin was prepared in the usual fashion using a vacuum mixer. Cement was applied to the cut surface of the proximal tibia as well as along the undersurface of a size 4 rotating platform tibial component. Tibial component was positioned and impacted into place. Excess cement was removed using [employer] fast streamer. Cement was then applied to the cut surfaces of the femur as well as along the posterior flanges of the size 5N femoral component. The femoral component was positioned and impacted into place. Excess cement was removed using [employer] fast streamer. A 5 mm polyethylene trial was inserted and the knee was brought into full extension with steady axial compression applied. Finally, cement was applied to the backside of a 35 mm medialized dome patella and the patellar component was positioned and patellar clamp applied. Excess cement was removed using [employer] fast streamer. After adequate curing of the cement, the tourniquet was deflated after a total tourniquet time of 83 minutes. Hemostasis was achieved using  electrocautery. The knee was irrigated with copious amounts of normal saline with antibiotic solution using pulsatile lavage and then suctioned dry. 20 mL of 1.3% Exparel and 60 mL of 0.25% Marcaine in 40 mL of normal saline was injected along the posterior capsule, medial and lateral gutters, and along the arthrotomy site. A 5 mm stabilized rotating platform polyethylene insert was inserted and the knee was placed through a range of motion with excellent mediolateral soft tissue balancing appreciated and excellent patellar tracking noted. 2 medium drains were placed in the wound bed and brought out through separate stab incisions. The medial parapatellar portion of the incision was reapproximated using interrupted sutures of #1 Vicryl. Subcutaneous tissue was approximated in layers using first #0 Vicryl followed #2-0 Vicryl. The skin was approximated with skin staples. A sterile dressing was applied.  The patient tolerated the procedure well and was transported to the recovery room in stable condition.    Adaleen Hulgan P. Holley Bouche., M.D.

## 2018-07-04 NOTE — Anesthesia Post-op Follow-up Note (Signed)
Anesthesia QCDR form completed.        

## 2018-07-04 NOTE — Progress Notes (Signed)
Patient dangled to the side of the bed. Tolerated well.  

## 2018-07-04 NOTE — Anesthesia Procedure Notes (Addendum)
Spinal  Patient location during procedure: OR Start time: 07/04/2018 3:35 PM End time: 07/04/2018 3:39 PM Staffing Anesthesiologist: Alvin Critchley, MD Performed: anesthesiologist  Preanesthetic Checklist Completed: patient identified, site marked, surgical consent, pre-op evaluation, timeout performed, IV checked, risks and benefits discussed and monitors and equipment checked Spinal Block Patient position: sitting Prep: ChloraPrep Patient monitoring: heart rate, continuous pulse ox, blood pressure and cardiac monitor Approach: right paramedian Location: L3-4 Injection technique: single-shot Needle Needle type: Introducer and Pencan  Needle gauge: 24 G Needle length: 9 cm Assessment Sensory level: T6 Additional Notes Negative paresthesia. Negative blood return. Positive free-flowing CSF. Expiration date of kit checked and confirmed. Patient tolerated procedure well, without complications.

## 2018-07-04 NOTE — Anesthesia Preprocedure Evaluation (Addendum)
Anesthesia Evaluation  Patient identified by MRN, date of birth, ID band Patient awake    Reviewed: Allergy & Precautions, H&P , NPO status , Patient's Chart, lab work & pertinent test results  History of Anesthesia Complications (+) PONV and history of anesthetic complications  Airway Mallampati: III       Dental  (+) Teeth Intact, Caps   Pulmonary neg pulmonary ROS, neg COPD, Current Smoker,           Cardiovascular hypertension, (-) Past MI and (-) Cardiac Stents negative cardio ROS  (-) dysrhythmias      Neuro/Psych PSYCHIATRIC DISORDERS Anxiety Depression negative neurological ROS     GI/Hepatic negative GI ROS, Neg liver ROS,   Endo/Other  negative endocrine ROS  Renal/GU      Musculoskeletal  (+) Arthritis ,   Abdominal   Peds  Hematology negative hematology ROS (+)   Anesthesia Other Findings Past Medical History: No date: Arthritis No date: Family history of adverse reaction to anesthesia     Comment:  brother hard to wake up after surgery No date: Hypertension 1980s: PONV (postoperative nausea and vomiting) No date: Pre-diabetes  Past Surgical History: 2014: ABDOMINAL HYSTERECTOMY     Comment:  Dr. Sabra Heck No date: APPENDECTOMY 2014: HERNIA REPAIR     Comment:  Dr. Pat Patrick 01/31/2018: KNEE ARTHROPLASTY; Right     Comment:  Procedure: COMPUTER ASSISTED TOTAL KNEE ARTHROPLASTY;                Surgeon: Dereck Leep, MD;  Location: [facility] ORS;                Service: Orthopedics;  Laterality: Right; 2007: KNEE SURGERY; Bilateral 1999: LIPOMA EXCISION     Comment:  of the left side of the neck No date: NECK SURGERY No date: SALPINGOOPHORECTOMY; Bilateral No date: TONSILLECTOMY No date: TUBAL LIGATION     Reproductive/Obstetrics negative OB ROS                            Anesthesia Physical Anesthesia Plan  ASA: II  Anesthesia Plan: Spinal and General   Post-op  Pain Management:    Induction:   PONV Risk Score and Plan: Propofol infusion and Ondansetron  Airway Management Planned: Simple Face Mask and Natural Airway  Additional Equipment:   Intra-op Plan:   Post-operative Plan:   Informed Consent: I have reviewed the patients History and Physical, chart, labs and discussed the procedure including the risks, benefits and alternatives for the proposed anesthesia with the patient or authorized representative who has indicated his/her understanding and acceptance.   Dental Advisory Given  Plan Discussed with: Anesthesiologist, CRNA and Surgeon  Anesthesia Plan Comments:         Anesthesia Quick Evaluation

## 2018-07-04 NOTE — H&P (Signed)
The patient has been re-examined, and the chart reviewed, and there have been no interval changes to the documented history and physical.    The risks, benefits, and alternatives have been discussed at length. The patient expressed understanding of the risks benefits and agreed with plans for surgical intervention.  James P. Hooten, Jr. M.D.    

## 2018-07-04 NOTE — Transfer of Care (Signed)
Immediate Anesthesia Transfer of Care Note  Patient: Ruth Higgins  Procedure(s) Performed: COMPUTER ASSISTED TOTAL KNEE ARTHROPLASTY (Left )  Patient Location: PACU  Anesthesia Type:Spinal  Level of Consciousness: awake, alert , oriented and patient cooperative  Airway & Oxygen Therapy: Patient Spontanous Breathing and Patient connected to nasal cannula oxygen  Post-op Assessment: Report given to RN and Post -op Vital signs reviewed and stable  Post vital signs: Reviewed and stable  Last Vitals:  Vitals Value Taken Time  BP 141/77 07/04/2018  6:48 PM  Temp    Pulse 58 07/04/2018  6:50 PM  Resp 26 07/04/2018  6:50 PM  SpO2 93 % 07/04/2018  6:50 PM  Vitals shown include unvalidated device data.  Last Pain:  Vitals:   07/04/18 1347  TempSrc: Oral  PainSc: 0-No pain         Complications: No apparent anesthesia complications

## 2018-07-05 ENCOUNTER — Encounter: Payer: Self-pay | Admitting: Orthopedic Surgery

## 2018-07-05 MED ORDER — FLUCONAZOLE 100 MG PO TABS
200.0000 mg | ORAL_TABLET | Freq: Once | ORAL | Status: AC
  Administered 2018-07-05: 200 mg via ORAL
  Filled 2018-07-05: qty 2

## 2018-07-05 MED ORDER — ENOXAPARIN SODIUM 40 MG/0.4ML ~~LOC~~ SOLN: 40.0000 mg | SUBCUTANEOUS | 0 refills | Status: AC

## 2018-07-05 MED ORDER — OXYCODONE HCL 5 MG PO TABS: 5.0000 mg | ORAL_TABLET | ORAL | 0 refills | Status: DC | PRN

## 2018-07-05 MED ORDER — TRAMADOL HCL 50 MG PO TABS: 50.0000 mg | ORAL_TABLET | Freq: Four times a day (QID) | ORAL | 0 refills | Status: AC | PRN

## 2018-07-05 NOTE — Evaluation (Signed)
Physical Therapy Evaluation Patient Details Name: Ruth Higgins MRN: [id number] DOB: 08-18-1955 Today's Date: 07/05/2018   History of Present Illness  62yo female s/p L TKA on 07/04/18. PMHx includes R TKA on 01/31/18, HTN, anxiety, depression, and tobacco use.  Clinical Impression  Pt pleasant t/o the initial PT exam POD1, but perseverated on how she did "poorly" after the R TKA (~5 months ago); specifically ROM and that she is somewhat anxious about how she'll do this time.  She ultimately did quite well and showed good quad control (able to do 10 SLRs with limited warm up), had >90* of flexion and was able to ambulate with consistent gait pattern and only minimal hesitation ~75 ft.  Pt showed good effort and was functionally very competent.  Pt should be able to go home (friend's house) safely with HHPT once medically cleared for d/c.     Follow Up Recommendations Home health PT    Equipment Recommendations  None recommended by PT    Recommendations for Other Services       Precautions / Restrictions Precautions Precautions: Fall Restrictions Weight Bearing Restrictions: Yes LLE Weight Bearing: Weight bearing as tolerated      Mobility  Bed Mobility               General bed mobility comments: deferred, up in recliner  Transfers Overall transfer level: Needs assistance Equipment used: Rolling walker (2 wheeled) Transfers: Sit to/from Stand Sit to Stand: Supervision         General transfer comment: Pt was able to rise to standing w/o direct assist, cuing to insure appropriate sequencing and safety  Ambulation/Gait Ambulation/Gait assistance: Supervision Gait Distance (Feet): 75 Feet Assistive device: Rolling walker (2 wheeled)       General Gait Details: Pt was initially a little hesitant with ambulation and reported feeling that she needed to be more deliberate with quad during L WBing but ultimately did much better than her initial ambulation with R  TKA a few months ago  Financial trader Rankin (Stroke Patients Only)       Balance Overall balance assessment: Modified Independent                                           Pertinent Vitals/Pain Pain Assessment: No/denies pain(minimal pain in L knee during session)    Home Living Family/patient expects to be discharged to:: Private residence Living Arrangements: Spouse/significant other Available Help at Discharge: Friend(s);Available PRN/intermittently;Available 24 hours/day Type of Home: House Home Access: Stairs to enter Entrance Stairs-Rails: Psychiatric nurse of Steps: 3 Home Layout: One level Home Equipment: Bedside commode;Walker - 2 wheels;Shower seat      Prior Function Level of Independence: Independent         Comments: pt independent after recovery from R TKA in July, works, drives, no falls     Hand Dominance   Dominant Hand: Right    Extremity/Trunk Assessment   Upper Extremity Assessment Upper Extremity Assessment: Overall WFL for tasks assessed    Lower Extremity Assessment Lower Extremity Assessment: Overall WFL for tasks assessed(expected post-op weakness, R knee flexion limited)    Cervical / Trunk Assessment Cervical / Trunk Assessment: Normal  Communication   Communication: No difficulties  Cognition Arousal/Alertness: Awake/alert Behavior During Therapy: WFL for tasks assessed/performed  Overall Cognitive Status: Within Functional Limits for tasks assessed                                        General Comments      Exercises Total Joint Exercises Ankle Circles/Pumps: AROM;10 reps Quad Sets: Strengthening;10 reps Short Arc Quad: Strengthening;AROM;10 reps Heel Slides: AAROM;10 reps Hip ABduction/ADduction: Strengthening;10 reps Straight Leg Raises: AROM;10 reps Knee Flexion: PROM;5 reps Goniometric ROM: 0-92 Other Exercises Other  Exercises: pt/son/friend instructed in polar care mgt, compression stocking mgt, falls prevention, pet care considerations (friend has a dog), and home/routines modifications   Assessment/Plan    PT Assessment Patient needs continued PT services  PT Problem List Decreased strength;Decreased range of motion;Decreased activity tolerance;Decreased balance;Decreased mobility;Decreased coordination;Decreased knowledge of use of DME;Decreased safety awareness;Pain       PT Treatment Interventions DME instruction;Gait training;Stair training;Functional mobility training;Therapeutic activities;Therapeutic exercise;Balance training;Neuromuscular re-education;Patient/family education    PT Goals (Current goals can be found in the Care Plan section)  Acute Rehab PT Goals Patient Stated Goal: to go to my friend's house and recover PT Goal Formulation: With patient Time For Goal Achievement: 07/05/18 Potential to Achieve Goals: Good    Frequency BID   Barriers to discharge        Co-evaluation               AM-PAC PT "6 Clicks" Mobility  Outcome Measure Help needed turning from your back to your side while in a flat bed without using bedrails?: None Help needed moving from lying on your back to sitting on the side of a flat bed without using bedrails?: None Help needed moving to and from a bed to a chair (including a wheelchair)?: None Help needed standing up from a chair using your arms (e.g., wheelchair or bedside chair)?: None Help needed to walk in hospital room?: A Little Help needed climbing 3-5 steps with a railing? : A Little 6 Click Score: 22    End of Session Equipment Utilized During Treatment: Gait belt Activity Tolerance: Patient tolerated treatment well Patient left: with chair alarm set;with call bell/phone within reach;with family/visitor present Nurse Communication: Mobility status PT Visit Diagnosis: Muscle weakness (generalized) (M62.81);Difficulty in walking, not  elsewhere classified (R26.2);Pain Pain - Right/Left: Left Pain - part of body: Knee    Time: 3335-4562 PT Time Calculation (min) (ACUTE ONLY): 44 min   Charges:   PT Evaluation $PT Eval Low Complexity: 1 Low PT Treatments $Gait Training: 8-22 mins $Therapeutic Exercise: 8-22 mins        Kreg Shropshire, DPT 07/05/2018, 11:36 AM

## 2018-07-05 NOTE — Progress Notes (Addendum)
ORTHOPAEDICS PROGRESS NOTE  PATIENT NAME: Ruth Higgins DOB: 1955/09/16  MRN: [id number]  POD # 1: Left total knee arthroplasty  Subjective: The patient rested well last night.  No nausea or vomiting. The patient sat at the side of the bed and dangled as per nursing.  Objective: Vital signs in last 24 hours: Temp:  [97.1 F (36.2 C)-97.8 F (36.6 C)] 97.7 F (36.5 C) (12/31 0021) Pulse Rate:  [49-76] 60 (12/31 0021) Resp:  [10-21] 18 (12/31 0021) BP: (101-154)/(60-96) 140/76 (12/31 0021) SpO2:  [91 %-99 %] 98 % (12/31 0021) Weight:  [77.9 kg] 77.9 kg (12/30 2315)  Intake/Output from previous day: 12/30 0701 - 12/31 0700 In: 3030.4 [P.O.:237; I.V.:2643.4; IV Piggyback:150] Out: 1155 [Urine:975; Drains:130; Blood:50]  No results for input(s): WBC, HGB, HCT, PLT, K, CL, CO2, BUN, CREATININE, GLUCOSE, CALCIUM, LABPT, INR in the last 72 hours.  EXAM General: Well-developed well-nourished female seen in no apparent discomfort. Lungs: clear to auscultation Cardiac: normal rate and regular rhythm Abdomen: Soft, nontender, nondistended.  Bowel sounds are present. Left lower extremity: Dressing is dry and intact.  Polar Care and Hemovac are in place and functioning.  Bone foam is in place with the knee fully extended.  The patient is able to perform an independent straight leg raise.  Homans test is negative. Neurologic: Awake, alert, and oriented.  Sensory and motor function are intact.  Assessment: Left total knee arthroplasty  Secondary diagnoses: Hypertension Obesity (BMI 32)  Plan: Today's goals were reviewed with the patient.  Begin physical therapy and Occupational Therapy as per total knee arthroplasty rehab protocol. Plan is to go Home after hospital stay. DVT Prophylaxis - Lovenox, Foot Pumps and TED hose  Patient has a significant history of vaginal candidiasis after IV antibiotics.  Diflucan 200 mg once to be given today.  Rogelio Winbush P. Holley Bouche M.D.

## 2018-07-05 NOTE — Progress Notes (Signed)
Physical Therapy Treatment Patient Details Name: Ruth Higgins MRN: [id number] DOB: [birth year] Today's Date: 07/05/2018    History of Present Illness 62yo female s/p L TKA on 07/04/18. PMHx includes R TKA on 01/31/18, HTN, anxiety, depression, and tobacco use.    PT Comments    Pt did very well with ambulation using walker and was able to negotiate up/down steps (using R rail, L foot leading).  She reported some increased pain during ambulation, but only minimally and generally tolerated all exercises and mobility w/o issue.  Pt making very good gains post-op day 1.   Follow Up Recommendations  Home health PT     Equipment Recommendations  None recommended by PT    Recommendations for Other Services       Precautions / Restrictions Precautions Precautions: Fall Restrictions LLE Weight Bearing: Weight bearing as tolerated    Mobility  Bed Mobility Overal bed mobility: Independent             General bed mobility comments: Pt able to get LEs back into bed and scooted up in bed w/o assist  Transfers Overall transfer level: Modified independent Equipment used: Rolling walker (2 wheeled) Transfers: Sit to/from Stand Sit to Stand: Supervision         General transfer comment: Pt needed only minimal cuing for set up and sequencing, no phyiscal assist.  Ambulation/Gait Ambulation/Gait assistance: Supervision Gait Distance (Feet): 225 Feet Assistive device: Rolling walker (2 wheeled)       General Gait Details: Pt with nearly consistent cadence and no limp or excessive fatigue.  She still reports feeling the need to actively engage quad to avoid buckling (no buckling noted) but generally was safe and confident.   Stairs Stairs: Yes Stairs assistance: Min guard Stair Management: One rail Right;Step to pattern;Sideways Number of Stairs: 4 General stair comments: Despite new TKA on L she felt more comfortable leading with L LE and was able to manage with heavy  UE use of rails.     Wheelchair Mobility    Modified Rankin (Stroke Patients Only)       Balance Overall balance assessment: Modified Independent                                          Cognition Arousal/Alertness: Awake/alert Behavior During Therapy: WFL for tasks assessed/performed Overall Cognitive Status: Within Functional Limits for tasks assessed                                        Exercises Total Joint Exercises Ankle Circles/Pumps: AROM;10 reps Quad Sets: Strengthening;15 reps Short Arc Quad: Strengthening;15 reps Heel Slides: AAROM;10 reps Hip ABduction/ADduction: Strengthening;10 reps Straight Leg Raises: AROM;10 reps Knee Flexion: PROM;5 reps    General Comments        Pertinent Vitals/Pain      Home Living                      Prior Function            PT Goals (current goals can now be found in the care plan section) Progress towards PT goals: Progressing toward goals    Frequency    BID      PT Plan Current plan remains appropriate    Co-evaluation  AM-PAC PT "6 Clicks" Mobility   Outcome Measure  Help needed turning from your back to your side while in a flat bed without using bedrails?: None Help needed moving from lying on your back to sitting on the side of a flat bed without using bedrails?: None Help needed moving to and from a bed to a chair (including a wheelchair)?: None Help needed standing up from a chair using your arms (e.g., wheelchair or bedside chair)?: None Help needed to walk in hospital room?: None Help needed climbing 3-5 steps with a railing? : A Little 6 Click Score: 23    End of Session Equipment Utilized During Treatment: Gait belt Activity Tolerance: Patient tolerated treatment well Patient left: with chair alarm set;with call bell/phone within reach;with family/visitor present Nurse Communication: Mobility status PT Visit Diagnosis: Muscle  weakness (generalized) (M62.81);Difficulty in walking, not elsewhere classified (R26.2);Pain Pain - Right/Left: Left Pain - part of body: Knee     Time: 1610-9604 PT Time Calculation (min) (ACUTE ONLY): 25 min  Charges:  $Gait Training: 8-22 mins $Therapeutic Exercise: 8-22 mins                     Kreg Shropshire, DPT 07/05/2018, 5:01 PM

## 2018-07-05 NOTE — NC FL2 (Signed)
[place] LEVEL OF CARE SCREENING TOOL     IDENTIFICATION  Patient Name: Ruth Higgins Birthdate: [birth year] Sex: female Admission Date (Current Location): 07/04/2018  [place] and [place] Number:  Engineering geologist and Address:  [facility], [address] [place], [place] [address]      Provider Number: [id number]  Attending Physician Name and Address:  Dereck Leep, MD  Relative Name and Phone Number:       Current Level of Care: Hospital Recommended Level of Care: [place] Prior Approval Number:    Date Approved/Denied:   PASRR Number: [id number])  Discharge Plan: SNF    Current Diagnoses: Patient Active Problem List   Diagnosis Date Noted  . Total knee replacement status 07/04/2018  . Edema of extremities 02/09/2018  . Status post total right knee replacement 01/31/2018  . Personal history of tobacco use, presenting hazards to health 12/01/2016  . Encounter for screening colonoscopy 12/06/2015  . Abdominal mass 11/23/2015  . Neck mass 11/23/2015  . Hyperglycemia 05/20/2015  . Anxiety, mild 03/15/2015  . Allergic rhinitis 03/15/2015  . Depression, major, single episode, mild ([place]) 03/15/2015  . Essential (primary) hypertension 03/15/2015  . Fam hx-polycystic kidney 03/15/2015  . HLD (hyperlipidemia) 03/15/2015  . Cannot sleep 03/15/2015  . Menopause 03/15/2015  . Primary osteoarthritis of left knee 03/15/2015  . Morbid obesity with body mass index (BMI) of 40.0 or higher ([place]) 03/15/2015  . Current tobacco use 03/15/2015    Orientation RESPIRATION BLADDER Height & Weight     Self, Time, Situation, Place  Normal Continent Weight: 171 lb 11.8 oz (77.9 kg) Height:  5\' 1"  (154.9 cm)  BEHAVIORAL SYMPTOMS/MOOD NEUROLOGICAL BOWEL NUTRITION STATUS      Continent Diet(Diet: Regular )  AMBULATORY STATUS COMMUNICATION OF NEEDS Skin   Extensive Assist Verbally Surgical wounds(Incision: Left Knee.  )                       Personal Care Assistance Level of Assistance  Bathing, Feeding, Dressing Bathing Assistance: Limited assistance Feeding assistance: Independent Dressing Assistance: Limited assistance     Functional Limitations Info  Sight, Hearing, Speech Sight Info: Adequate Hearing Info: Adequate Speech Info: Adequate    SPECIAL CARE FACTORS FREQUENCY  PT (By licensed PT), OT (By licensed OT)     PT Frequency: (5) OT Frequency: (5)            Contractures      Additional Factors Info  Code Status, Allergies Code Status Info: (Full Code. ) Allergies Info: (Ceclor Cefaclor, Demerol Meperidine, Erythromycin, Flagyl Metronidazole, Hydrochlorothiazide, Hydrocodone- acetaminophen, Morphine And Related, Sulfa Antibiotics, Penicillins)           Current Medications (07/05/2018):  This is the current hospital active medication list Current Facility-Administered Medications  Medication Dose Route Frequency Provider Last Rate Last Dose  . 0.9 %  sodium chloride infusion   Intravenous Continuous Hooten, Laurice Record, MD 100 mL/hr at 07/05/18 0814    . acetaminophen (OFIRMEV) IV 1,000 mg  1,000 mg Intravenous Q6H Dereck Leep, MD 400 mL/hr at 07/05/18 0641 1,000 mg at 07/05/18 0641  . acetaminophen (TYLENOL) tablet 325-650 mg  325-650 mg Oral Q6H PRN Hooten, Laurice Record, MD      . alum & mag hydroxide-simeth (MAALOX/MYLANTA) 200-200-20 MG/5ML suspension 30 mL  30 mL Oral Q4H PRN Hooten, Laurice Record, MD      . bisacodyl (DULCOLAX) suppository 10 mg  10  mg Rectal Daily PRN Hooten, Laurice Record, MD      . celecoxib (CELEBREX) capsule 200 mg  200 mg Oral BID Dereck Leep, MD   200 mg at 07/05/18 1610  . clindamycin (CLEOCIN) IVPB 600 mg  600 mg Intravenous Q6H Hooten, Laurice Record, MD 100 mL/hr at 07/05/18 0400 600 mg at 07/05/18 0400  . diphenhydrAMINE (BENADRYL) 12.5 MG/5ML elixir 12.5-25 mg  12.5-25 mg Oral Q4H PRN Hooten, Laurice Record, MD      . enoxaparin (LOVENOX) injection 30 mg  30  mg Subcutaneous Q12H Hooten, Laurice Record, MD      . ferrous sulfate tablet 325 mg  325 mg Oral BID WC Hooten, Laurice Record, MD   325 mg at 07/05/18 9604  . fluconazole (DIFLUCAN) tablet 200 mg  200 mg Oral Once Hooten, Laurice Record, MD      . gabapentin (NEURONTIN) capsule 300 mg  300 mg Oral QHS Hooten, Laurice Record, MD   300 mg at 07/04/18 2216  . HYDROmorphone (DILAUDID) injection 0.5-1 mg  0.5-1 mg Intravenous Q4H PRN Hooten, Laurice Record, MD      . lisinopril (PRINIVIL,ZESTRIL) tablet 40 mg  40 mg Oral Daily Hooten, Laurice Record, MD      . LORazepam (ATIVAN) tablet 0.5 mg  0.5 mg Oral Q8H PRN Hooten, Laurice Record, MD      . magnesium hydroxide (MILK OF MAGNESIA) suspension 30 mL  30 mL Oral Daily Hooten, Laurice Record, MD      . menthol-cetylpyridinium (CEPACOL) lozenge 3 mg  1 lozenge Oral PRN Hooten, Laurice Record, MD       Or  . phenol (CHLORASEPTIC) mouth spray 1 spray  1 spray Mouth/Throat PRN Hooten, Laurice Record, MD      . metoCLOPramide (REGLAN) tablet 5-10 mg  5-10 mg Oral Q8H PRN Hooten, Laurice Record, MD       Or  . metoCLOPramide (REGLAN) injection 5-10 mg  5-10 mg Intravenous Q8H PRN Hooten, Laurice Record, MD      . metoCLOPramide (REGLAN) tablet 10 mg  10 mg Oral TID AC & HS Hooten, Laurice Record, MD   10 mg at 07/05/18 5409  . metoprolol succinate (TOPROL-XL) 24 hr tablet 50 mg  50 mg Oral Daily Hooten, Laurice Record, MD      . nicotine (NICODERM CQ - dosed in mg/24 hours) patch 21 mg  21 mg Transdermal Daily PRN Dereck Leep, MD   21 mg at 07/05/18 8119  . ondansetron (ZOFRAN) tablet 4 mg  4 mg Oral Q6H PRN Hooten, Laurice Record, MD   4 mg at 07/04/18 2216   Or  . ondansetron (ZOFRAN) injection 4 mg  4 mg Intravenous Q6H PRN Hooten, Laurice Record, MD      . oxyCODONE (Oxy IR/ROXICODONE) immediate release tablet 10 mg  10 mg Oral Q4H PRN Hooten, Laurice Record, MD      . oxyCODONE (Oxy IR/ROXICODONE) immediate release tablet 5 mg  5 mg Oral Q4H PRN Dereck Leep, MD   5 mg at 07/05/18 1478  . pantoprazole (PROTONIX) EC tablet 40 mg  40 mg Oral BID Dereck Leep, MD   40 mg at 07/05/18 2956  . senna-docusate (Senokot-S) tablet 1 tablet  1 tablet Oral BID Dereck Leep, MD   1 tablet at 07/05/18 [id number]  . sodium phosphate (FLEET) 7-19 GM/118ML enema 1 enema  1 enema Rectal Once PRN Hooten, Laurice Record, MD      . traMADol Veatrice Bourbon)  tablet 50-100 mg  50-100 mg Oral Q4H PRN Hooten, Laurice Record, MD      . Derrill Memo ON 07/06/2018] Vitamin D (Ergocalciferol) (DRISDOL) capsule 50,000 Units  50,000 Units Oral Weekly Hooten, Laurice Record, MD         Discharge Medications: Please see discharge summary for a list of discharge medications.  Relevant Imaging Results:  Relevant Lab Results:   Additional Information (SSN: [id number])  Clelia Trabucco, Veronia Beets, LCSW

## 2018-07-05 NOTE — Evaluation (Signed)
Occupational Therapy Evaluation Patient Details Name: Ruth Higgins MRN: [id number] DOB: 1955/09/10 Today's Date: 07/05/2018    History of Present Illness 62yo female s/p L TKA on 07/04/18. PMHx includes R TKA on 01/31/18, HTN, anxiety, depression, and tobacco use.   Clinical Impression   Pt seen for OT evaluation this date, POD#1 from above surgery. Pt was independent in all ADLs prior to surgery. Pt is eager to return to PLOF with less pain and improved safety and independence. Pt currently requires PRN minimal assist for LB dressing/bathing while in seated position due to pain and limited AROM of L knee. Pt planning to stay at a friend's home after surgery (friend present for session). Pt/friend/son instructed in polar care mgt, falls prevention strategies, home/routines modifications, DME/AE for LB bathing and dressing tasks, pet care considerations, and compression stocking mgt. All verbalized understanding. No further skilled OT needs indicated at this time. Pt/friend/son in agreement with plan. Will sign off.     Follow Up Recommendations  No OT follow up    Equipment Recommendations  Other (comment)(reacher, sock aide)    Recommendations for Other Services       Precautions / Restrictions Precautions Precautions: Fall Restrictions Weight Bearing Restrictions: Yes LLE Weight Bearing: Weight bearing as tolerated      Mobility Bed Mobility               General bed mobility comments: deferred, up in recliner  Transfers Overall transfer level: Needs assistance Equipment used: Rolling walker (2 wheeled) Transfers: Sit to/from Stand Sit to Stand: Supervision              Balance Overall balance assessment: No apparent balance deficits (not formally assessed)                                         ADL either performed or assessed with clinical judgement   ADL Overall ADL's : Needs assistance/impaired                                        General ADL Comments: Min A for LB ADL, friend able to provided     Vision Patient Visual Report: No change from baseline       Perception     Praxis      Pertinent Vitals/Pain Pain Assessment: No/denies pain(minimal pain in L knee during session)     Hand Dominance Right   Extremity/Trunk Assessment Upper Extremity Assessment Upper Extremity Assessment: Overall WFL for tasks assessed   Lower Extremity Assessment Lower Extremity Assessment: Overall WFL for tasks assessed(expected post-op strength/ROM deficits in LLE)   Cervical / Trunk Assessment Cervical / Trunk Assessment: Normal   Communication Communication Communication: No difficulties   Cognition Arousal/Alertness: Awake/alert Behavior During Therapy: WFL for tasks assessed/performed Overall Cognitive Status: Within Functional Limits for tasks assessed                                     General Comments       Exercises Other Exercises Other Exercises: pt/son/friend instructed in polar care mgt, compression stocking mgt, falls prevention, pet care considerations (friend has a dog), and home/routines modifications   Shoulder Instructions      Home Living  Family/patient expects to be discharged to:: Private residence(planning to d/c to friend's home- info provided below is for friend's home)   Available Help at Discharge: Friend(s);Available PRN/intermittently;Available 24 hours/day(friend works, but can work from home) Type of Home: [employer] Access: Stairs to enter Technical brewer of Steps: 3 Entrance Stairs-Rails: [place]: One level     Bathroom Shower/Tub: Teacher, early years/pre: Handicapped height     Home Equipment: Bedside commode;Walker - 2 wheels;Shower seat          Prior Functioning/Environment Level of Independence: Independent        Comments: pt independent after recovery from R TKA in July, works, drives,  no falls        OT Problem List: Decreased strength;Decreased range of motion;Pain      OT Treatment/Interventions:      OT Goals(Current goals can be found in the care plan section) Acute Rehab OT Goals Patient Stated Goal: to go to my friend's house and recover OT Goal Formulation: All assessment and education complete, DC therapy  OT Frequency:     Barriers to D/C:            Co-evaluation              AM-PAC OT "6 Clicks" Daily Activity     Outcome Measure Help from another person eating meals?: None Help from another person taking care of personal grooming?: None Help from another person toileting, which includes using toliet, bedpan, or urinal?: A Little Help from another person bathing (including washing, rinsing, drying)?: A Little Help from another person to put on and taking off regular upper body clothing?: None Help from another person to put on and taking off regular lower body clothing?: A Little 6 Click Score: 21   End of Session Nurse Communication: Other (comment)(IV infiltrated - RN in room during session to address)  Activity Tolerance: Patient tolerated treatment well Patient left: in chair;with call bell/phone within reach;with chair alarm set;with family/visitor present;with SCD's reapplied;Other (comment)(polar care in place)  OT Visit Diagnosis: Other abnormalities of gait and mobility (R26.89);Pain Pain - Right/Left: Left Pain - part of body: Knee                Time: 4709-6283 OT Time Calculation (min): 25 min Charges:  OT General Charges $OT Visit: 1 Visit OT Evaluation $OT Eval Low Complexity: 1 Low OT Treatments $Self Care/Home Management : 8-22 mins  Jeni Salles, MPH, MS, OTR/L ascom [phone] 07/05/18, 11:08 AM

## 2018-07-05 NOTE — Discharge Summary (Signed)
Physician Discharge Summary  Patient ID: Ruth Higgins MRN: [id number] DOB/AGE: [birth year] 62 y.o.  Admit date: 07/04/2018 Discharge date: 07/06/2018  Admission Diagnoses:  LEFT KNEE OSTEOARTHRITIS   Discharge Diagnoses: Patient Active Problem List   Diagnosis Date Noted  . Total knee replacement status 07/04/2018  . Edema of extremities 02/09/2018  . Status post total right knee replacement 01/31/2018  . Personal history of tobacco use, presenting hazards to health 12/01/2016  . Encounter for screening colonoscopy 12/06/2015  . Abdominal mass 11/23/2015  . Neck mass 11/23/2015  . Hyperglycemia 05/20/2015  . Anxiety, mild 03/15/2015  . Allergic rhinitis 03/15/2015  . Depression, major, single episode, mild ([place]) 03/15/2015  . Essential (primary) hypertension 03/15/2015  . Fam hx-polycystic kidney 03/15/2015  . HLD (hyperlipidemia) 03/15/2015  . Cannot sleep 03/15/2015  . Menopause 03/15/2015  . Primary osteoarthritis of left knee 03/15/2015  . Morbid obesity with body mass index (BMI) of 40.0 or higher ([place]) 03/15/2015  . Current tobacco use 03/15/2015    Past Medical History:  Diagnosis Date  . Arthritis   . Family history of adverse reaction to anesthesia    brother hard to wake up after surgery  . Hypertension   . PONV (postoperative nausea and vomiting) 1980s  . Pre-diabetes      Transfusion: No transfusions during this admission   Consultants (if any):   Discharged Condition: Improved  Hospital Course: [name] is an 62 y.o. female who was admitted 07/04/2018 with a diagnosis of degenerative arthrosis left knee and went to the operating room on 07/04/2018 and underwent the above named procedures.    Surgeries:Procedure(s): COMPUTER ASSISTED TOTAL KNEE ARTHROPLASTY on 07/04/2018  PRE-OPERATIVE DIAGNOSIS: Degenerative arthrosis of the left knee, primary  POST-OPERATIVE DIAGNOSIS:  Same  PROCEDURE:  Left total knee arthroplasty using  computer-assisted navigation  SURGEON:  Marciano Sequin. M.D.  ASSISTANT:  Vance Peper, PA (present and scrubbed throughout the case, critical for assistance with exposure, retraction, instrumentation, and closure)  ANESTHESIA: spinal  ESTIMATED BLOOD LOSS: 50 mL  FLUIDS REPLACED: 2000 mL of crystalloid  TOURNIQUET TIME: 83 minutes  DRAINS: 2 medium Hemovac drains  SOFT TISSUE RELEASES: Anterior cruciate ligament, posterior cruciate ligament, deep medial collateral ligament, patellofemoral ligament  IMPLANTS UTILIZED: DePuy Attune size 5N posterior stabilized femoral component (cemented), size 4 rotating platform tibial component (cemented), 35 mm medialized dome patella (cemented), and a 5 mm stabilized rotating platform polyethylene insert.  INDICATIONS FOR SURGERY: MARCIA Higgins is a 62 y.o. year old female with a long history of progressive knee pain. X-rays demonstrated severe degenerative changes in tricompartmental fashion. The patient had not seen any significant improvement despite conservative nonsurgical intervention. After discussion of the risks and benefits of surgical intervention, the patient expressed understanding of the risks benefits and agree with plans for total knee arthroplasty.   The risks, benefits, and alternatives were discussed at length including but not limited to the risks of infection, bleeding, nerve injury, stiffness, blood clots, the need for revision surgery, cardiopulmonary complications, among others, and they were willing to proceed. Patient tolerated the surgery well. No complications .Patient was taken to PACU where she was stabilized and then transferred to the orthopedic floor.  Patient started on Lovenox 30 mg q 12 hrs. Foot pumps applied bilaterally at 80 mm hgb. Heels elevated off bed with rolled towels. No evidence of DVT. Calves non tender. Negative [place]. Physical therapy started on day #1 for gait training and transfer with  OT starting  on  day #1 for ADL and assisted devices. Patient has done well with therapy. Ambulated greater than 200 feet upon being discharged.  Was able to ascend and descend 4 steps safely and independently  Patient's IV And Foley were discontinued on day #1 with Hemovac being discontinued on day #2. Dressing was changed on day 2 prior to patient being discharged   She was given perioperative antibiotics:  Anti-infectives (From admission, onward)   Start     Dose/Rate Route Frequency Ordered Stop   07/05/18 0800  fluconazole (DIFLUCAN) tablet 200 mg     200 mg Oral  Once 07/05/18 0751     07/04/18 2200  clindamycin (CLEOCIN) IVPB 600 mg     600 mg 100 mL/hr over 30 Minutes Intravenous Every 6 hours 07/04/18 2107 07/05/18 2159   07/04/18 1718  gentamicin (GARAMYCIN) 80 mg in sodium chloride 0.9 % 500 mL irrigation  Status:  Discontinued       As needed 07/04/18 1718 07/04/18 1844   07/04/18 1338  clindamycin (CLEOCIN) 900 MG/50ML IVPB    Note to Pharmacy:  Lyman Bishop   : cabinet override      07/04/18 1338 07/04/18 1547   07/04/18 0600  clindamycin (CLEOCIN) IVPB 900 mg  Status:  Discontinued     900 mg 100 mL/hr over 30 Minutes Intravenous On call to O.R. 07/03/18 2218 07/04/18 1245    .  She was fitted with AV 1 compression foot pump devices, instructed on heel pumps, early ambulation, and fitted with TED stockings bilaterally for DVT prophylaxis.  She benefited maximally from the hospital stay and there were no complications.    Recent vital signs:  Vitals:   07/04/18 2315 07/05/18 0021  BP: 138/74 140/76  Pulse: (!) 58 60  Resp: 18 18  Temp: (!) 97.4 F (36.3 C) 97.7 F (36.5 C)  SpO2: 96% 98%    Recent laboratory studies:  Lab Results  Component Value Date   HGB 13.4 06/21/2018   HGB 14.7 01/19/2018   HGB 14.5 08/01/2013   Lab Results  Component Value Date   WBC 8.3 06/21/2018   PLT 248 06/21/2018   Lab Results  Component Value Date   INR 0.90  06/21/2018   Lab Results  Component Value Date   NA 140 06/21/2018   K 3.5 06/21/2018   CL 107 06/21/2018   CO2 26 06/21/2018   BUN 17 06/21/2018   CREATININE 0.96 06/21/2018   GLUCOSE 111 (H) 06/21/2018    Discharge Medications:   Allergies as of 07/05/2018      Reactions   Ceclor [cefaclor] Anaphylaxis, Hives   Demerol [meperidine] Nausea And Vomiting   Erythromycin    EES caused jaundice like symptoms    Flagyl [metronidazole] Swelling   Hydrochlorothiazide    severe cramping in her legs/knee/calves   Hydrocodone-acetaminophen Nausea And Vomiting   Morphine And Related    Pt prefers to not be given morphine, tolerates hydromorphone instead    Sulfa Antibiotics Swelling   Penicillins Hives, Rash   Has patient had a PCN reaction causing immediate rash, facial/tongue/throat swelling, SOB or lightheadedness with hypotension: Yes Has patient had a PCN reaction causing severe rash involving mucus membranes or skin necrosis: Yes Has patient had a PCN reaction that required hospitalization: Yes Has patient had a PCN reaction occurring within the last 10 years: No If all of the above answers are "NO", then may proceed with Cephalosporin use.      Medication List  TAKE these medications   enoxaparin 40 MG/0.4ML injection Commonly known as:  LOVENOX Inject 0.4 mLs (40 mg total) into the skin daily for 14 days. Start taking on:  July 07, 2018   ergocalciferol 1.25 MG (50000 UT) capsule Commonly known as:  VITAMIN D2 Take 50,000 Units by mouth once a week.   lisinopril 40 MG tablet Commonly known as:  PRINIVIL,ZESTRIL Take 40 mg by mouth daily.   LORazepam 0.5 MG tablet Commonly known as:  ATIVAN Take 0.5 mg by mouth every 8 (eight) hours as needed for anxiety.   metoprolol succinate 50 MG 24 hr tablet Commonly known as:  TOPROL-XL Take 50 mg by mouth daily. Take with or immediately following a meal.   nicotine 21 mg/24hr patch Commonly known as:  NICODERM CQ -  dosed in mg/24 hours Place 21 mg onto the skin daily as needed (smoke cessation).   oxyCODONE 5 MG immediate release tablet Commonly known as:  Oxy IR/ROXICODONE Take 1 tablet (5 mg total) by mouth every 4 (four) hours as needed for moderate pain (pain score 4-6).   Pseudoephedrine-Ibuprofen 30-200 MG Tabs Take 1 tablet by mouth every 4 (four) hours as needed.   traMADol 50 MG tablet Commonly known as:  ULTRAM Take 1-2 tablets (50-100 mg total) by mouth every 6 (six) hours as needed for moderate pain.            Durable Medical Equipment  (From admission, onward)         Start     Ordered   07/04/18 2108  DME Walker rolling  Once    Question:  Patient needs a walker to treat with the following condition  Answer:  Total knee replacement status   07/04/18 2107   07/04/18 2108  DME Bedside commode  Once    Question:  Patient needs a bedside commode to treat with the following condition  Answer:  Total knee replacement status   07/04/18 2107          Diagnostic Studies: Dg Knee Left Port  Result Date: 07/04/2018 CLINICAL DATA:  Postop left knee replacement EXAM: PORTABLE LEFT KNEE - 1-2 VIEW COMPARISON:  11/07/2008 FINDINGS: Changes of left knee replacement. No hardware or bony complicating feature. Soft tissue drains in place. IMPRESSION: Left knee replacement.  No visible complicating feature. Electronically Signed   By: Rolm Baptise M.D.   On: 07/04/2018 19:05    Disposition:   Discharge Instructions    Diet - low sodium heart healthy   Complete by:  As directed    Increase activity slowly   Complete by:  As directed       Follow-up Information    Watt Climes, PA On 07/19/2018.   Specialty:  Physician Assistant Why:  at 10:15am Contact information: [place] [address] [phone]        Dereck Leep, MD On 08/16/2018.   Specialty:  Orthopedic Surgery Why:  at 2:15pm Contact information: [place] [address] [phone]            Signed: Watt Climes 07/05/2018, 7:59 AM

## 2018-07-05 NOTE — Care Management Note (Signed)
Case Management Note  Patient Details  Name: Ruth Higgins MRN: [id number] Date of Birth: [birth year]  Subjective/Objective:                   RNCM Met with patient and Son, Ruth Higgins. To discuss DC planning Patient plans to return to friends home at [place]. followed by [facility], [facility] has accepted patient  Margretta Ditty [address], [place]  [phone] Has a front wheeled walker available for use at home She uses [facility] [phone] Son will pick up medication today as 4 syringes are avail. 0$ for patient cost,   Since they are closed on the Holiday, the remaining are going to be available on Thursday  Action/Plan:   [facility] agency list per CMS.gov provided to pt for review, referral to [facility]  Expected Discharge Date:  07/08/18               Expected Discharge Plan:     In-House Referral:     Discharge planning Services  CM Consult  Post Acute Care Choice:  Home Health Choice offered to:  Patient  DME Arranged:    DME Agency:     HH Arranged:  PT [place]:  [place]  Status of Service:  In process, will continue to follow  If discussed at Long Length of Stay Meetings, dates discussed:    Additional Comments:  Su Hilt, RN 07/05/2018, 3:15 PM

## 2018-07-05 NOTE — Progress Notes (Signed)
Clinical Social Worker (CSW) received SNF consult. PT is recommending home health. RN case manager aware of above. Please reconsult if future social work needs arise. CSW signing off.   Belita Warsame, LCSW [phone] 

## 2018-07-06 DIAGNOSIS — Z888 Allergy status to other drugs, medicaments and biological substances status: Secondary | ICD-10-CM | POA: Diagnosis not present

## 2018-07-06 DIAGNOSIS — Z885 Allergy status to narcotic agent status: Secondary | ICD-10-CM | POA: Diagnosis not present

## 2018-07-06 DIAGNOSIS — M1712 Unilateral primary osteoarthritis, left knee: Secondary | ICD-10-CM | POA: Diagnosis not present

## 2018-07-06 DIAGNOSIS — Z6832 Body mass index (BMI) 32.0-32.9, adult: Secondary | ICD-10-CM | POA: Diagnosis not present

## 2018-07-06 DIAGNOSIS — Z882 Allergy status to sulfonamides status: Secondary | ICD-10-CM | POA: Diagnosis not present

## 2018-07-06 DIAGNOSIS — E669 Obesity, unspecified: Secondary | ICD-10-CM | POA: Diagnosis not present

## 2018-07-06 DIAGNOSIS — Z96651 Presence of right artificial knee joint: Secondary | ICD-10-CM | POA: Diagnosis not present

## 2018-07-06 DIAGNOSIS — I10 Essential (primary) hypertension: Secondary | ICD-10-CM | POA: Diagnosis not present

## 2018-07-06 DIAGNOSIS — Z881 Allergy status to other antibiotic agents status: Secondary | ICD-10-CM | POA: Diagnosis not present

## 2018-07-06 MED ORDER — FLUCONAZOLE 50 MG PO TABS
150.0000 mg | ORAL_TABLET | Freq: Once | ORAL | Status: AC
  Administered 2018-07-06: 150 mg via ORAL
  Filled 2018-07-06: qty 1

## 2018-07-06 NOTE — Progress Notes (Signed)
Physical Therapy Treatment Patient Details Name: Ruth Higgins MRN: [id number] DOB: October 28, 1955 Today's Date: 07/06/2018    History of Present Illness 63yo female s/p L TKA on 07/04/18. PMHx includes R TKA on 01/31/18, HTN, anxiety, depression, and tobacco use.    PT Comments    Pt agreeable to PT; 4/10 pain in L knee. Progressing very well with all mobility and range. Only requires supervision level for all mobility. Pt prepared to discharge home for home health PT.    Follow Up Recommendations  Home health PT     Equipment Recommendations  None recommended by PT    Recommendations for Other Services       Precautions / Restrictions Precautions Precautions: Fall Restrictions Weight Bearing Restrictions: Yes LLE Weight Bearing: Weight bearing as tolerated    Mobility  Bed Mobility Overal bed mobility: Independent                Transfers Overall transfer level: Modified independent Equipment used: Rolling walker (2 wheeled) Transfers: Sit to/from Stand Sit to Stand: Supervision         General transfer comment: Performs safely  Ambulation/Gait Ambulation/Gait assistance: Supervision Gait Distance (Feet): 260 Feet Assistive device: Rolling walker (2 wheeled)       General Gait Details: Good step through reciprocal pattern; decreased pace as expected, but fluid/comfortable without increased pain   Stairs             Wheelchair Mobility    Modified Rankin (Stroke Patients Only)       Balance Overall balance assessment: Modified Independent                                          Cognition Arousal/Alertness: Awake/alert Behavior During Therapy: WFL for tasks assessed/performed Overall Cognitive Status: Within Functional Limits for tasks assessed                                        Exercises Total Joint Exercises Quad Sets: Strengthening;Both;20 reps Knee Flexion: AROM;Left;10  reps;Seated Goniometric ROM: 0-100 degrees    General Comments        Pertinent Vitals/Pain Pain Assessment: 0-10 Pain Score: 4  Pain Location: L knee Pain Descriptors / Indicators: Aching;Constant Pain Intervention(s): Ice applied;Monitored during session;Premedicated before session    Home Living                      Prior Function            PT Goals (current goals can now be found in the care plan section) Progress towards PT goals: Progressing toward goals    Frequency    BID      PT Plan Current plan remains appropriate    Co-evaluation              AM-PAC PT "6 Clicks" Mobility   Outcome Measure  Help needed turning from your back to your side while in a flat bed without using bedrails?: None Help needed moving from lying on your back to sitting on the side of a flat bed without using bedrails?: None Help needed moving to and from a bed to a chair (including a wheelchair)?: None Help needed standing up from a chair using your arms (e.g., wheelchair or bedside chair)?: None Help  needed to walk in hospital room?: None Help needed climbing 3-5 steps with a railing? : A Little 6 Click Score: 23    End of Session   Activity Tolerance: Patient tolerated treatment well Patient left: in bed;with family/visitor present;Other (comment)(polar care in place)   PT Visit Diagnosis: Muscle weakness (generalized) (M62.81);Difficulty in walking, not elsewhere classified (R26.2);Pain Pain - Right/Left: Left Pain - part of body: Knee     Time: 1006-1030 PT Time Calculation (min) (ACUTE ONLY): 24 min  Charges:  $Gait Training: 8-22 mins $Therapeutic Exercise: 8-22 mins                      Larae Grooms, PTA 07/06/2018, 10:47 AM

## 2018-07-06 NOTE — Plan of Care (Signed)

## 2018-07-06 NOTE — Progress Notes (Signed)
Pt ready for discharge home today per MD. Patient assessment unchanged from this morning. Pt met PT goals. Reviewed discharge instructions and prescriptions with pt and her son; all questions answered and pt verbalized understanding. PIV removed, VSS. Pt assisted to car via NT.   Ethelda Chick

## 2018-07-06 NOTE — Progress Notes (Addendum)
Pt's HR has been in 50s this morning and throughout admission. Rachelle Hora PA notified, ordered to hold metoprolol. Pt updated.

## 2018-07-06 NOTE — Progress Notes (Signed)
   Subjective: 2 Days Post-Op Procedure(s) (LRB): COMPUTER ASSISTED TOTAL KNEE ARTHROPLASTY (Left) Patient reports pain as mild.   Patient is well, and has had no acute complaints or problems Denies any CP, SOB, ABD pain. We will continue therapy today.  Plan is to go Home after hospital stay.  Objective: Vital signs in last 24 hours: Temp:  [97.5 F (36.4 C)-97.8 F (36.6 C)] 97.5 F (36.4 C) (12/31 2308) Pulse Rate:  [51-55] 51 (12/31 2308) BP: (109-118)/(53-75) 118/63 (12/31 2308) SpO2:  [97 %-98 %] 97 % (12/31 2308)  Intake/Output from previous day: 12/31 0701 - 01/01 0700 In: 720 [P.O.:720] Out: 210 [Drains:210] Intake/Output this shift: No intake/output data recorded.  No results for input(s): HGB in the last 72 hours. No results for input(s): WBC, RBC, HCT, PLT in the last 72 hours. No results for input(s): NA, K, CL, CO2, BUN, CREATININE, GLUCOSE, CALCIUM in the last 72 hours. No results for input(s): LABPT, INR in the last 72 hours.  EXAM General - Patient is Alert, Appropriate and Oriented Extremity - Neurovascular intact Sensation intact distally Intact pulses distally Dorsiflexion/Plantar flexion intact No cellulitis present Compartment soft Dressing - dressing C/D/I and no drainage.  Hemovac removed Motor Function - intact, moving foot and toes well on exam.   Past Medical History:  Diagnosis Date  . Arthritis   . Family history of adverse reaction to anesthesia    brother hard to wake up after surgery  . Hypertension   . PONV (postoperative nausea and vomiting) 1980s  . Pre-diabetes     Assessment/Plan:   2 Days Post-Op Procedure(s) (LRB): COMPUTER ASSISTED TOTAL KNEE ARTHROPLASTY (Left) Active Problems:   Total knee replacement status  Estimated body mass index is 32.45 kg/m as calculated from the following:   Height as of this encounter: 5\' 1"  (1.549 m).   Weight as of this encounter: 77.9 kg. Advance diet Up with therapy  Vital signs  stable Patient doing well, pain controlled Discharge home with home health PT today after morning therapy    DVT Prophylaxis - Lovenox, Foot Pumps and TED hose Weight-Bearing as tolerated to left leg   T. Rachelle Hora, PA-C [place] 07/06/2018, 8:19 AM

## 2018-07-07 ENCOUNTER — Other Ambulatory Visit: Payer: Self-pay | Admitting: *Deleted

## 2018-07-07 NOTE — Patient Outreach (Signed)
[facility]) Care [facility]  07/07/2018  Ruth Higgins 08-13-1955 [id number]  Transition of care call  Subjective: Initial successful telephone call to patient's preferred number in order to complete transition of care assessment;  2 HIPAA identifiers verified. Explained purpose of call and completed transition of care assessment. Also assessed and/or reviewed: caregiver assistance, pain control with prescribed pain medications, medication reconciliation, medication taking behavior, ambulation ability, presence of ordered DME, bowel and bladder function, diet tolerance, outpatient physical therapy, verification of ordered home health services are being provided or participating in home exercise program, blood sugar control if appropriate, normal wound or dressing appearance, and post-operative problems requiring MD notification Ruth Higgins states she placed a call to her surgeon's office and spoke with the receptionist asking for someone to call her about taking her Lovenx injection today since her drain site is oozing. This RNCM advised hee to call the surgeon on call if she does not hear back from the office before they close today..   Objective: Ruth Higgins was hospitalized at [facility] 07/04/18-07/06/2018 for left total knee replacement related to osteoarthritis. . She had the right knee replaced on  01/31/18. Comorbidities include:       HTN, tobacco use, and vitamin D deficiency . She was discharged to home on 07/06/2018 with home health physical services to be provided by [facility].  Assessment:  See transition of care flowsheet for assessment details. .   Plan:  No care Higgins needs identified so will close case to Ruth [facility]gins care Higgins services and route successful outreach letter to [facility] clinical pool to be mailed to patient's home address.   Ruth Ellison RN,CCM,CDE [place] [facility] Coordinator Office Phone [phone] Office Fax [phone]

## 2018-07-08 DIAGNOSIS — Z471 Aftercare following joint replacement surgery: Secondary | ICD-10-CM | POA: Diagnosis not present

## 2018-07-08 DIAGNOSIS — F419 Anxiety disorder, unspecified: Secondary | ICD-10-CM | POA: Diagnosis not present

## 2018-07-08 DIAGNOSIS — Z7901 Long term (current) use of anticoagulants: Secondary | ICD-10-CM | POA: Diagnosis not present

## 2018-07-08 DIAGNOSIS — M1990 Unspecified osteoarthritis, unspecified site: Secondary | ICD-10-CM | POA: Diagnosis not present

## 2018-07-08 DIAGNOSIS — I10 Essential (primary) hypertension: Secondary | ICD-10-CM | POA: Diagnosis not present

## 2018-07-08 DIAGNOSIS — Z96653 Presence of artificial knee joint, bilateral: Secondary | ICD-10-CM | POA: Diagnosis not present

## 2018-07-08 DIAGNOSIS — R7303 Prediabetes: Secondary | ICD-10-CM | POA: Diagnosis not present

## 2018-07-08 DIAGNOSIS — F320 Major depressive disorder, single episode, mild: Secondary | ICD-10-CM | POA: Diagnosis not present

## 2018-07-11 DIAGNOSIS — F419 Anxiety disorder, unspecified: Secondary | ICD-10-CM | POA: Diagnosis not present

## 2018-07-11 DIAGNOSIS — I10 Essential (primary) hypertension: Secondary | ICD-10-CM | POA: Diagnosis not present

## 2018-07-11 DIAGNOSIS — Z96653 Presence of artificial knee joint, bilateral: Secondary | ICD-10-CM | POA: Diagnosis not present

## 2018-07-11 DIAGNOSIS — R7303 Prediabetes: Secondary | ICD-10-CM | POA: Diagnosis not present

## 2018-07-11 DIAGNOSIS — F320 Major depressive disorder, single episode, mild: Secondary | ICD-10-CM | POA: Diagnosis not present

## 2018-07-11 DIAGNOSIS — Z7901 Long term (current) use of anticoagulants: Secondary | ICD-10-CM | POA: Diagnosis not present

## 2018-07-11 DIAGNOSIS — Z471 Aftercare following joint replacement surgery: Secondary | ICD-10-CM | POA: Diagnosis not present

## 2018-07-11 DIAGNOSIS — M1990 Unspecified osteoarthritis, unspecified site: Secondary | ICD-10-CM | POA: Diagnosis not present

## 2018-07-13 DIAGNOSIS — R7303 Prediabetes: Secondary | ICD-10-CM | POA: Diagnosis not present

## 2018-07-13 DIAGNOSIS — I10 Essential (primary) hypertension: Secondary | ICD-10-CM | POA: Diagnosis not present

## 2018-07-13 DIAGNOSIS — Z471 Aftercare following joint replacement surgery: Secondary | ICD-10-CM | POA: Diagnosis not present

## 2018-07-13 DIAGNOSIS — M1990 Unspecified osteoarthritis, unspecified site: Secondary | ICD-10-CM | POA: Diagnosis not present

## 2018-07-13 DIAGNOSIS — F320 Major depressive disorder, single episode, mild: Secondary | ICD-10-CM | POA: Diagnosis not present

## 2018-07-13 DIAGNOSIS — Z96653 Presence of artificial knee joint, bilateral: Secondary | ICD-10-CM | POA: Diagnosis not present

## 2018-07-13 DIAGNOSIS — Z7901 Long term (current) use of anticoagulants: Secondary | ICD-10-CM | POA: Diagnosis not present

## 2018-07-13 DIAGNOSIS — F419 Anxiety disorder, unspecified: Secondary | ICD-10-CM | POA: Diagnosis not present

## 2018-07-13 NOTE — Anesthesia Postprocedure Evaluation (Signed)
Anesthesia Post Note  Patient: Ruth Higgins  Procedure(s) Performed: COMPUTER ASSISTED TOTAL KNEE ARTHROPLASTY (Left )  Patient location during evaluation: Other Anesthesia Type: Spinal Level of consciousness: awake and alert and oriented Pain management: pain level controlled Vital Signs Assessment: post-procedure vital signs reviewed and stable Respiratory status: spontaneous breathing Cardiovascular status: blood pressure returned to baseline Anesthetic complications: no     Last Vitals:  Vitals:   07/06/18 1006 07/06/18 1135  BP:  134/63  Pulse: (!) 56 60  Resp:  18  Temp:  (!) 36.4 C  SpO2: 97% 97%    Last Pain:  Vitals:   07/06/18 1135  TempSrc: Oral  PainSc:                  Emy Angevine

## 2018-07-15 DIAGNOSIS — Z471 Aftercare following joint replacement surgery: Secondary | ICD-10-CM | POA: Diagnosis not present

## 2018-07-15 DIAGNOSIS — Z96653 Presence of artificial knee joint, bilateral: Secondary | ICD-10-CM | POA: Diagnosis not present

## 2018-07-15 DIAGNOSIS — R7303 Prediabetes: Secondary | ICD-10-CM | POA: Diagnosis not present

## 2018-07-15 DIAGNOSIS — I10 Essential (primary) hypertension: Secondary | ICD-10-CM | POA: Diagnosis not present

## 2018-07-15 DIAGNOSIS — F320 Major depressive disorder, single episode, mild: Secondary | ICD-10-CM | POA: Diagnosis not present

## 2018-07-15 DIAGNOSIS — Z7901 Long term (current) use of anticoagulants: Secondary | ICD-10-CM | POA: Diagnosis not present

## 2018-07-15 DIAGNOSIS — M1990 Unspecified osteoarthritis, unspecified site: Secondary | ICD-10-CM | POA: Diagnosis not present

## 2018-07-15 DIAGNOSIS — F419 Anxiety disorder, unspecified: Secondary | ICD-10-CM | POA: Diagnosis not present

## 2018-07-18 DIAGNOSIS — F320 Major depressive disorder, single episode, mild: Secondary | ICD-10-CM | POA: Diagnosis not present

## 2018-07-18 DIAGNOSIS — Z96653 Presence of artificial knee joint, bilateral: Secondary | ICD-10-CM | POA: Diagnosis not present

## 2018-07-18 DIAGNOSIS — Z471 Aftercare following joint replacement surgery: Secondary | ICD-10-CM | POA: Diagnosis not present

## 2018-07-18 DIAGNOSIS — R7303 Prediabetes: Secondary | ICD-10-CM | POA: Diagnosis not present

## 2018-07-18 DIAGNOSIS — M1990 Unspecified osteoarthritis, unspecified site: Secondary | ICD-10-CM | POA: Diagnosis not present

## 2018-07-18 DIAGNOSIS — F419 Anxiety disorder, unspecified: Secondary | ICD-10-CM | POA: Diagnosis not present

## 2018-07-18 DIAGNOSIS — I10 Essential (primary) hypertension: Secondary | ICD-10-CM | POA: Diagnosis not present

## 2018-07-18 DIAGNOSIS — Z7901 Long term (current) use of anticoagulants: Secondary | ICD-10-CM | POA: Diagnosis not present

## 2018-07-19 DIAGNOSIS — Z96652 Presence of left artificial knee joint: Secondary | ICD-10-CM | POA: Diagnosis not present

## 2018-07-20 MED ORDER — LISINOPRIL 40 MG PO TABS: 40.0000 mg | ORAL_TABLET | Freq: Every day | ORAL | 5 refills | Status: DC

## 2018-07-20 MED ORDER — METOPROLOL SUCCINATE ER 50 MG PO TB24: 50.0000 mg | ORAL_TABLET | Freq: Every day | ORAL | 5 refills | Status: DC

## 2018-07-20 NOTE — Telephone Encounter (Signed)
Med sent in.

## 2018-07-20 NOTE — Telephone Encounter (Signed)
Ok on rf--no to alphalapoic acid.

## 2018-07-22 DIAGNOSIS — Z96652 Presence of left artificial knee joint: Secondary | ICD-10-CM | POA: Diagnosis not present

## 2018-07-24 DIAGNOSIS — Z471 Aftercare following joint replacement surgery: Secondary | ICD-10-CM | POA: Diagnosis not present

## 2018-07-25 ENCOUNTER — Other Ambulatory Visit: Payer: Self-pay | Admitting: Family Medicine

## 2018-07-25 DIAGNOSIS — Z96652 Presence of left artificial knee joint: Secondary | ICD-10-CM | POA: Diagnosis not present

## 2018-07-27 DIAGNOSIS — Z96652 Presence of left artificial knee joint: Secondary | ICD-10-CM | POA: Diagnosis not present

## 2018-07-27 DIAGNOSIS — M25562 Pain in left knee: Secondary | ICD-10-CM | POA: Diagnosis not present

## 2018-07-29 DIAGNOSIS — Z96652 Presence of left artificial knee joint: Secondary | ICD-10-CM | POA: Diagnosis not present

## 2018-08-01 DIAGNOSIS — Z96652 Presence of left artificial knee joint: Secondary | ICD-10-CM | POA: Diagnosis not present

## 2018-08-03 DIAGNOSIS — M25562 Pain in left knee: Secondary | ICD-10-CM | POA: Diagnosis not present

## 2018-08-03 DIAGNOSIS — Z96652 Presence of left artificial knee joint: Secondary | ICD-10-CM | POA: Diagnosis not present

## 2018-08-05 DIAGNOSIS — Z96652 Presence of left artificial knee joint: Secondary | ICD-10-CM | POA: Diagnosis not present

## 2018-08-08 DIAGNOSIS — Z96652 Presence of left artificial knee joint: Secondary | ICD-10-CM | POA: Diagnosis not present

## 2018-08-10 DIAGNOSIS — Z96652 Presence of left artificial knee joint: Secondary | ICD-10-CM | POA: Diagnosis not present

## 2018-08-10 DIAGNOSIS — M25562 Pain in left knee: Secondary | ICD-10-CM | POA: Diagnosis not present

## 2018-08-12 DIAGNOSIS — Z96652 Presence of left artificial knee joint: Secondary | ICD-10-CM | POA: Diagnosis not present

## 2018-08-12 DIAGNOSIS — M25562 Pain in left knee: Secondary | ICD-10-CM | POA: Diagnosis not present

## 2018-08-16 DIAGNOSIS — Z96652 Presence of left artificial knee joint: Secondary | ICD-10-CM | POA: Diagnosis not present

## 2018-08-18 DIAGNOSIS — Z96652 Presence of left artificial knee joint: Secondary | ICD-10-CM | POA: Diagnosis not present

## 2018-08-22 DIAGNOSIS — Z96652 Presence of left artificial knee joint: Secondary | ICD-10-CM | POA: Diagnosis not present

## 2018-08-24 DIAGNOSIS — Z96652 Presence of left artificial knee joint: Secondary | ICD-10-CM | POA: Diagnosis not present

## 2018-08-29 DIAGNOSIS — Z96652 Presence of left artificial knee joint: Secondary | ICD-10-CM | POA: Diagnosis not present

## 2018-08-29 DIAGNOSIS — M25662 Stiffness of left knee, not elsewhere classified: Secondary | ICD-10-CM | POA: Diagnosis not present

## 2018-08-31 DIAGNOSIS — Z96652 Presence of left artificial knee joint: Secondary | ICD-10-CM | POA: Diagnosis not present

## 2018-08-31 DIAGNOSIS — M25662 Stiffness of left knee, not elsewhere classified: Secondary | ICD-10-CM | POA: Diagnosis not present

## 2018-09-02 DIAGNOSIS — Z96652 Presence of left artificial knee joint: Secondary | ICD-10-CM | POA: Diagnosis not present

## 2018-09-05 DIAGNOSIS — Z96652 Presence of left artificial knee joint: Secondary | ICD-10-CM | POA: Diagnosis not present

## 2018-09-09 DIAGNOSIS — Z96652 Presence of left artificial knee joint: Secondary | ICD-10-CM | POA: Diagnosis not present

## 2018-09-12 DIAGNOSIS — Z96652 Presence of left artificial knee joint: Secondary | ICD-10-CM | POA: Diagnosis not present

## 2018-09-16 DIAGNOSIS — M25562 Pain in left knee: Secondary | ICD-10-CM | POA: Diagnosis not present

## 2018-09-16 DIAGNOSIS — Z96652 Presence of left artificial knee joint: Secondary | ICD-10-CM | POA: Diagnosis not present

## 2018-09-19 DIAGNOSIS — M25562 Pain in left knee: Secondary | ICD-10-CM | POA: Diagnosis not present

## 2018-09-19 DIAGNOSIS — Z96652 Presence of left artificial knee joint: Secondary | ICD-10-CM | POA: Diagnosis not present

## 2018-09-23 DIAGNOSIS — Z96652 Presence of left artificial knee joint: Secondary | ICD-10-CM | POA: Diagnosis not present

## 2018-09-26 DIAGNOSIS — Z96652 Presence of left artificial knee joint: Secondary | ICD-10-CM | POA: Diagnosis not present

## 2018-11-15 DIAGNOSIS — M1712 Unilateral primary osteoarthritis, left knee: Secondary | ICD-10-CM | POA: Diagnosis not present

## 2018-11-15 DIAGNOSIS — Z96652 Presence of left artificial knee joint: Secondary | ICD-10-CM | POA: Diagnosis not present

## 2018-11-22 ENCOUNTER — Other Ambulatory Visit: Payer: Self-pay

## 2018-11-22 ENCOUNTER — Ambulatory Visit: Payer: Self-pay | Admitting: Family Medicine

## 2018-11-22 ENCOUNTER — Encounter: Payer: Self-pay | Admitting: Family Medicine

## 2018-11-22 ENCOUNTER — Ambulatory Visit: Payer: 59 | Admitting: Family Medicine

## 2018-11-22 VITALS — BP 168/98 | HR 70 | Temp 98.5°F | Resp 18 | Wt 238.0 lb

## 2018-11-22 DIAGNOSIS — I10 Essential (primary) hypertension: Secondary | ICD-10-CM | POA: Diagnosis not present

## 2018-11-22 DIAGNOSIS — R600 Localized edema: Secondary | ICD-10-CM | POA: Diagnosis not present

## 2018-11-22 MED ORDER — FUROSEMIDE 20 MG PO TABS: 20.0000 mg | ORAL_TABLET | Freq: Every day | ORAL | 3 refills | Status: DC

## 2018-11-22 NOTE — Progress Notes (Signed)
Ruth Higgins  MRN: [id number] DOB: 1956/04/01  Subjective:  HPI  The patient is a 63 year old female who presents for evaluation of leg swelling.  Ruth Higgins is status post left knee replacement in December.  Ruth Higgins states after Ruth Higgins right knee replacement Ruth Higgins had swelling but it was just in Ruth Higgins ankles.  Ruth Higgins denies any SOB.  Patient states that the weight reading from 07/04/18 is inaccurate.  That puts Ruth Higgins weight increase at 10 pounds.  Wt Readings from Last 3 Encounters:  11/22/18 238 lb (108 kg)  07/04/18 171 lb 11.8 oz (77.9 kg)  06/21/18 228 lb 9.6 oz (103.7 kg)    Patient Active Problem List   Diagnosis Date Noted   Total knee replacement status 07/04/2018   Edema of extremities 02/09/2018   Status post total right knee replacement 01/31/2018   Personal history of tobacco use, presenting hazards to health 12/01/2016   Encounter for screening colonoscopy 12/06/2015   Abdominal mass 11/23/2015   Neck mass 11/23/2015   Hyperglycemia 05/20/2015   Anxiety, mild 03/15/2015   Allergic rhinitis 03/15/2015   Depression, major, single episode, mild ([place]) 03/15/2015   Essential (primary) hypertension 03/15/2015   Fam hx-polycystic kidney 03/15/2015   HLD (hyperlipidemia) 03/15/2015   Cannot sleep 03/15/2015   Menopause 03/15/2015   Primary osteoarthritis of left knee 03/15/2015   Morbid obesity with body mass index (BMI) of 40.0 or higher ([place]) 03/15/2015   Current tobacco use 03/15/2015   Past Medical History:  Diagnosis Date   Arthritis    Family history of adverse reaction to anesthesia    brother hard to wake up after surgery   Hypertension    PONV (postoperative nausea and vomiting) 1980s   Pre-diabetes    Social History   Socioeconomic History   Marital status: Married    Spouse name: Not on file   Number of children: Not on file   Years of education: Not on file   Highest education level: Not on file  Occupational History   Not on  file  Social Needs   Financial resource strain: Not on file   Food insecurity:    Worry: Not on file    Inability: Not on file   Transportation needs:    Medical: Not on file    Non-medical: Not on file  Tobacco Use   Smoking status: Current Every Day Smoker    Packs/day: 1.00    Years: 48.00    Pack years: 48.00    Types: Cigarettes   Smokeless tobacco: Never Used   Tobacco comment: smokes due to stress; has quit several times  Substance and Sexual Activity   Alcohol use: No    Alcohol/week: 0.0 standard drinks   Drug use: No   Sexual activity: Not on file  Lifestyle   Physical activity:    Days per week: Not on file    Minutes per session: Not on file   Stress: Not on file  Relationships   Social connections:    Talks on phone: Not on file    Gets together: Not on file    Attends religious service: Not on file    Active member of club or organization: Not on file    Attends meetings of clubs or organizations: Not on file    Relationship status: Not on file   Intimate partner violence:    Fear of current or ex partner: Not on file    Emotionally abused: Not on file  Physically abused: Not on file    Forced sexual activity: Not on file  Other Topics Concern   Not on file  Social History Narrative   Not on file   Outpatient Encounter Medications as of 11/22/2018  Medication Sig   enoxaparin (LOVENOX) 40 MG/0.4ML injection Inject 0.4 mLs (40 mg total) into the skin daily for 14 days.   ergocalciferol (VITAMIN D2) 1.25 MG (50000 UT) capsule Take 50,000 Units by mouth once a week.   lisinopril (PRINIVIL,ZESTRIL) 40 MG tablet Take 1 tablet (40 mg total) by mouth daily.   LORazepam (ATIVAN) 0.5 MG tablet TAKE ONE TABLET BY MOUTH TWICE DAILY AS NEEDED   metoprolol succinate (TOPROL-XL) 50 MG 24 hr tablet Take 1 tablet (50 mg total) by mouth daily. Take with or immediately following a meal.   nicotine (NICODERM CQ - DOSED IN MG/24 HOURS) 21 mg/24hr  patch Place 21 mg onto the skin daily as needed (smoke cessation).    oxyCODONE (OXY IR/ROXICODONE) 5 MG immediate release tablet Take 1 tablet (5 mg total) by mouth every 4 (four) hours as needed for moderate pain (pain score 4-6).   Pseudoephedrine-Ibuprofen 30-200 MG TABS Take 1 tablet by mouth every 4 (four) hours as needed.   traMADol (ULTRAM) 50 MG tablet Take 1-2 tablets (50-100 mg total) by mouth every 6 (six) hours as needed for moderate pain.   No facility-administered encounter medications on file as of 11/22/2018.    Allergies  Allergen Reactions   Ceclor [Cefaclor] Anaphylaxis and Hives   Demerol [Meperidine] Nausea And Vomiting   Erythromycin     EES caused jaundice like symptoms    Flagyl [Metronidazole] Swelling   Hydrochlorothiazide     severe cramping in Ruth Higgins legs/knee/calves   Hydrocodone-Acetaminophen Nausea And Vomiting   Morphine And Related     Pt prefers to not be given morphine, tolerates hydromorphone instead    Sulfa Antibiotics Swelling   Penicillins Hives and Rash    Has patient had a PCN reaction causing immediate rash, facial/tongue/throat swelling, SOB or lightheadedness with hypotension: Yes Has patient had a PCN reaction causing severe rash involving mucus membranes or skin necrosis: Yes Has patient had a PCN reaction that required hospitalization: Yes Has patient had a PCN reaction occurring within the last 10 years: No If all of the above answers are "NO", then may proceed with Cephalosporin use.     Review of Systems  Respiratory: Negative for cough, shortness of breath and wheezing.   Cardiovascular: Positive for leg swelling. Negative for chest pain and palpitations.    Objective:  Pulse 70    Temp 98.5 F (36.9 C) (Oral)    Wt 238 lb (108 kg)    SpO2 98%    BMI 44.97 kg/m   Physical Exam  Constitutional: Ruth Higgins is oriented to person, place, and time and well-developed, well-nourished, and in no distress.  HENT:  Head:  Normocephalic.  Eyes: Conjunctivae are normal.  Neck: Neck supple.  Cardiovascular: Normal rate and regular rhythm.  Pulmonary/Chest: Effort normal and breath sounds normal.  Abdominal: Soft.  Musculoskeletal: Normal range of motion.        General: Edema present.     Comments: Good pulses popliteal and feet. 2+ pitting edema both lower legs up to mid calf. Good ROM. Well healed scars from past knee replacements bilaterally.  Neurological: Ruth Higgins is alert and oriented to person, place, and time.  Skin: No rash noted.  Psychiatric: Mood, affect and judgment normal.  Assessment and Plan :  1. Edema of extremities Has noticed swelling (with pitting) of both lower legs up to mid calf since going back to work in March 2020 after having left knee joint replacement by Dr. Marry Guan (orthopedist) 07-04-18. (Had right total knee arthroplasty  01-01-18.) had follow up with Dr. Marry Guan on 11-15-18 and suggested Ruth Higgins consider a diuretic. BP elevated today and add Lasix 20 mg qd prn. Limit salt intake and monitor BP. Recheck labs and follow up pending reports. - furosemide (LASIX) 20 MG tablet; Take 1 tablet (20 mg total) by mouth daily.  Dispense: 30 tablet; Refill: 3 - Comprehensive metabolic panel  2. Essential (primary) hypertension BP 160/98 after resting in the exam room for 10-15 minutes. Still taking the Lisinopril 40 mg qd with Metoprolol Succinate 50 mg qd. Admits diet has not been "the best" and getting some extra sodium/salt in foods. Recommend Ruth Higgins use the Furosemide for peripheral edema which should help BP also. Check labs and follow up as planned with Dr. Rosanna Randy in June. - CBC with Differential/Platelet - Comprehensive metabolic panel - TSH  3. Morbid obesity with body mass index (BMI) of 40.0 or higher (HCC) BMI over 44.9 today. Recommend exercise and low fat diet to work on weight loss. Check CMP and TSH to rule out metabolic disorder. - Comprehensive metabolic panel - TSH

## 2018-11-23 LAB — CBC WITH DIFFERENTIAL/PLATELET
Basophils Absolute: 0.1 10*3/uL (ref 0.0–0.2)
Basos: 1 %
EOS (ABSOLUTE): 0.3 10*3/uL (ref 0.0–0.4)
Eos: 3 %
Hematocrit: 41.4 % (ref 34.0–46.6)
Hemoglobin: 13.2 g/dL (ref 11.1–15.9)
Immature Grans (Abs): 0.1 10*3/uL (ref 0.0–0.1)
Immature Granulocytes: 1 %
Lymphocytes Absolute: 2.2 10*3/uL (ref 0.7–3.1)
Lymphs: 24 %
MCH: 26.6 pg (ref 26.6–33.0)
MCHC: 31.9 g/dL (ref 31.5–35.7)
MCV: 83 fL (ref 79–97)
Monocytes Absolute: 0.5 10*3/uL (ref 0.1–0.9)
Monocytes: 6 %
Neutrophils Absolute: 6 10*3/uL (ref 1.4–7.0)
Neutrophils: 65 %
Platelets: 272 10*3/uL (ref 150–450)
RBC: 4.97 x10E6/uL (ref 3.77–5.28)
RDW: 14.3 % (ref 11.7–15.4)
WBC: 9.1 10*3/uL (ref 3.4–10.8)

## 2018-11-23 LAB — COMPREHENSIVE METABOLIC PANEL
ALT: 16 IU/L (ref 0–32)
AST: 13 IU/L (ref 0–40)
Albumin/Globulin Ratio: 1.8 (ref 1.2–2.2)
Albumin: 4.1 g/dL (ref 3.8–4.8)
Alkaline Phosphatase: 92 IU/L (ref 39–117)
BUN/Creatinine Ratio: 14 (ref 12–28)
BUN: 12 mg/dL (ref 8–27)
Bilirubin Total: 0.4 mg/dL (ref 0.0–1.2)
CO2: 24 mmol/L (ref 20–29)
Calcium: 9.3 mg/dL (ref 8.7–10.3)
Chloride: 106 mmol/L (ref 96–106)
Creatinine, Ser: 0.88 mg/dL (ref 0.57–1.00)
GFR calc Af Amer: 81 mL/min/{1.73_m2} (ref >59)
GFR calc non Af Amer: 70 mL/min/{1.73_m2} (ref >59)
Globulin, Total: 2.3 g/dL (ref 1.5–4.5)
Glucose: 99 mg/dL (ref 65–99)
Potassium: 4.1 mmol/L (ref 3.5–5.2)
Sodium: 145 mmol/L — ABNORMAL HIGH (ref 134–144)
Total Protein: 6.4 g/dL (ref 6.0–8.5)

## 2018-11-23 LAB — TSH: TSH: 1.62 u[IU]/mL (ref 0.450–4.500)

## 2018-11-25 ENCOUNTER — Telehealth: Payer: Self-pay

## 2018-11-25 MED ORDER — SPIRONOLACTONE 25 MG PO TABS: 25.0000 mg | ORAL_TABLET | Freq: Every day | ORAL | 0 refills | Status: DC

## 2018-11-25 NOTE — Telephone Encounter (Signed)
-----   Message from Margo Common, [place] sent at 11/24/2018 10:45 AM EDT ----- All blood tests are normal except sodium slightly elevated. Restrict salt/sodium in diet and the Furosemide for the edema should help this come back to normal range. Keep follow up appointment with Dr. Rosanna Randy on 12-27-18.

## 2018-11-25 NOTE — Telephone Encounter (Signed)
Patient was advised. Patient states that the pharmacy told her to take the real Lasix instead of the generic brand due to allergic reaction to sulfa drugs. Patient states she did not take the medication on 11/24/2018 and wants to know if she should try the medication again or did you want to change the medication first.

## 2018-11-25 NOTE — Telephone Encounter (Signed)
Patient was advised and stated that she will give the Spironolactone 25 mg daily. Medication send into pharmacy.

## 2018-11-25 NOTE — Telephone Encounter (Signed)
Can switch from the furosemide (Lasix) to Spironolactone 25 mg qd #30 to help with edema and getting BP back down. Recheck as planned.

## 2018-12-19 ENCOUNTER — Telehealth: Payer: Self-pay | Admitting: *Deleted

## 2018-12-19 NOTE — Telephone Encounter (Signed)
Left message for patient to notify them that it is time to schedule annual low dose lung cancer screening CT scan. Instructed patient to call back to verify information prior to the scan being scheduled.  

## 2018-12-20 ENCOUNTER — Telehealth: Payer: Self-pay | Admitting: *Deleted

## 2018-12-20 DIAGNOSIS — Z87891 Personal history of nicotine dependence: Secondary | ICD-10-CM

## 2018-12-20 DIAGNOSIS — Z122 Encounter for screening for malignant neoplasm of respiratory organs: Secondary | ICD-10-CM

## 2018-12-20 NOTE — Telephone Encounter (Signed)
Patient has been notified that annual lung cancer screening low dose CT scan is due currently or will be in near future. Confirmed that patient is within the age range of 55-77, and asymptomatic, (no signs or symptoms of lung cancer). Patient denies illness that would prevent curative treatment for lung cancer if found. Verified smoking history, (current, 49 pack year). The shared decision making visit was done 12/01/16. Patient is agreeable for CT scan being scheduled.

## 2018-12-23 ENCOUNTER — Other Ambulatory Visit: Payer: Self-pay

## 2018-12-23 ENCOUNTER — Ambulatory Visit
Admission: RE | Admit: 2018-12-23 | Discharge: 2018-12-23 | Disposition: A | Discharge status: Home / Self Care | Payer: 59 | Source: Ambulatory Visit | Attending: Oncology | Admitting: Oncology

## 2018-12-23 DIAGNOSIS — Z87891 Personal history of nicotine dependence: Secondary | ICD-10-CM | POA: Diagnosis not present

## 2018-12-23 DIAGNOSIS — Z122 Encounter for screening for malignant neoplasm of respiratory organs: Secondary | ICD-10-CM | POA: Diagnosis not present

## 2018-12-23 DIAGNOSIS — Z136 Encounter for screening for cardiovascular disorders: Secondary | ICD-10-CM | POA: Diagnosis not present

## 2018-12-23 IMAGING — CT CT CHEST LUNG CANCER SCREENING LOW DOSE
2 of 5 series · 15 of 40 positions shown, 18 images · non-contrast
Comparison: 12/01/2017.

CLINICAL DATA: 62-year-old female with 49 pack year history of
smoking. Lung cancer screening.

EXAM:
CT CHEST WITHOUT CONTRAST LOW-DOSE FOR LUNG CANCER SCREENING
TECHNIQUE: Multidetector CT imaging of the chest was performed following the
standard protocol without IV contrast.

[Series 3: lung · axial · 0.68mm/px · z∈[-1184,-901]mm · 12 of 315 slices shown, 15 images]
[im 16/315  mediastinal]
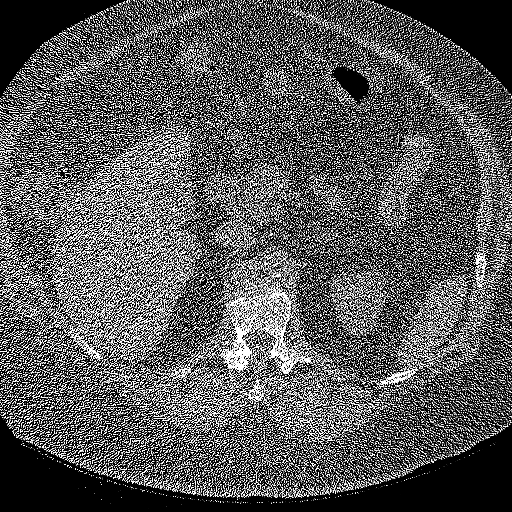
[im 16/315  lung]
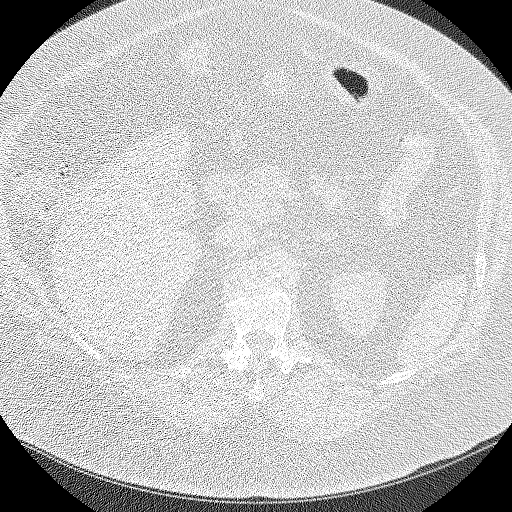
[im 48/315  lung]
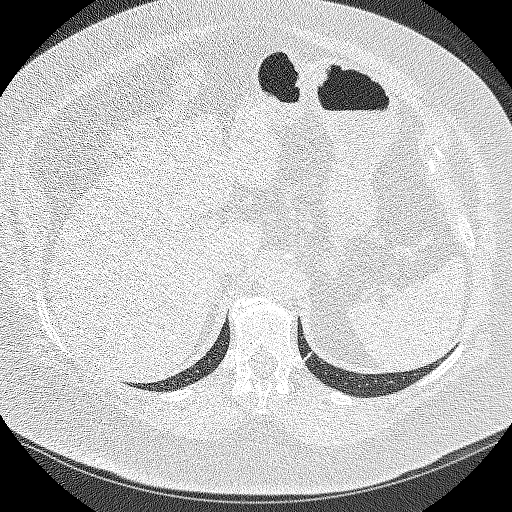
[im 63/315  lung]
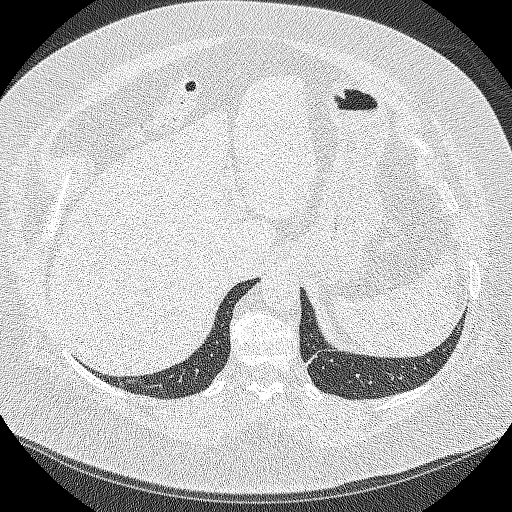
[im 95/315  lung]
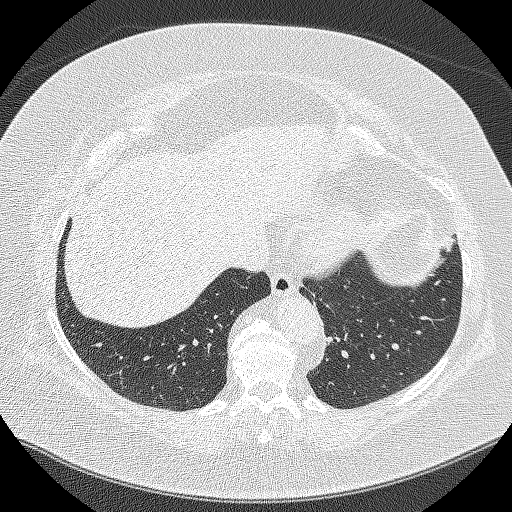
[im 126/315  mediastinal]
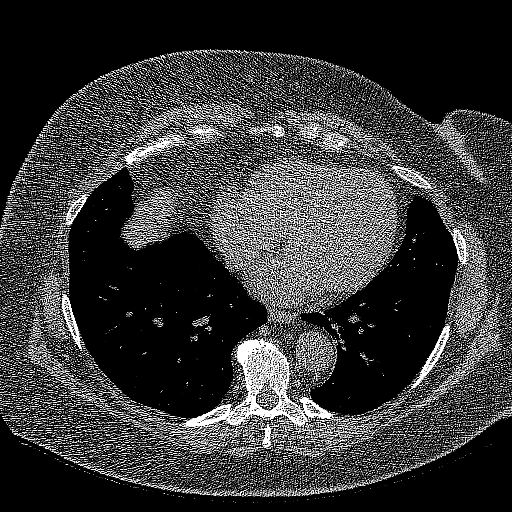
[im 126/315  lung]
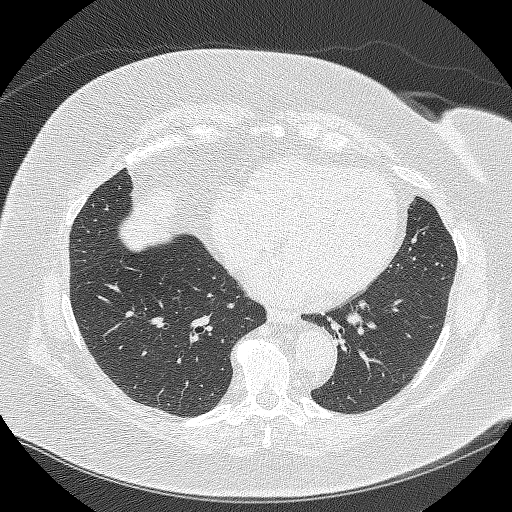
[im 142/315  lung]
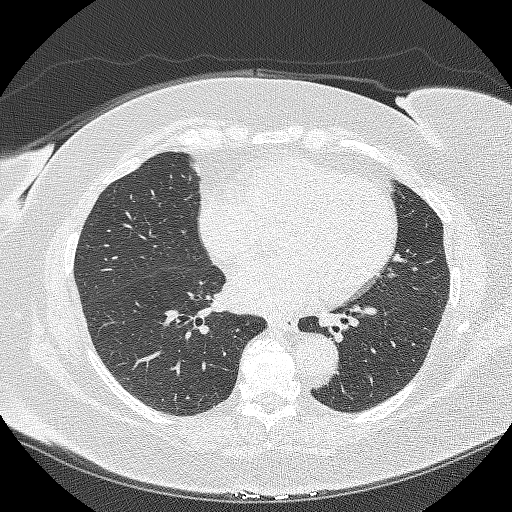
[im 173/315  lung]
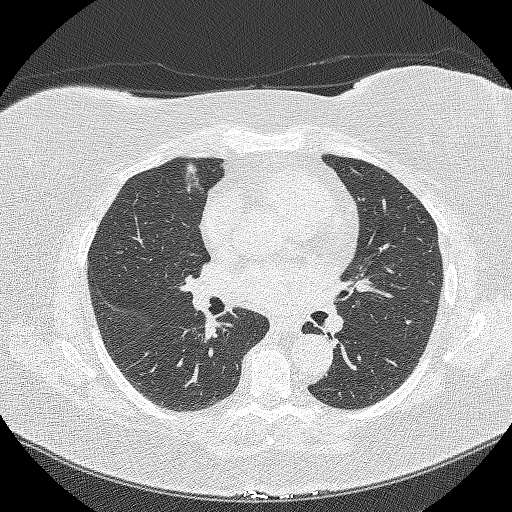
[im 189/315  lung]
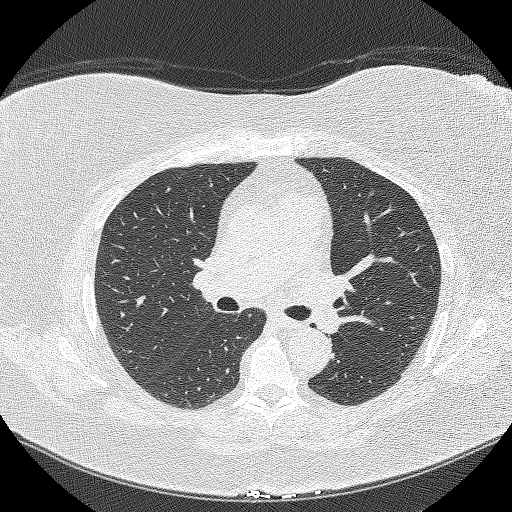
[im 220/315  mediastinal]
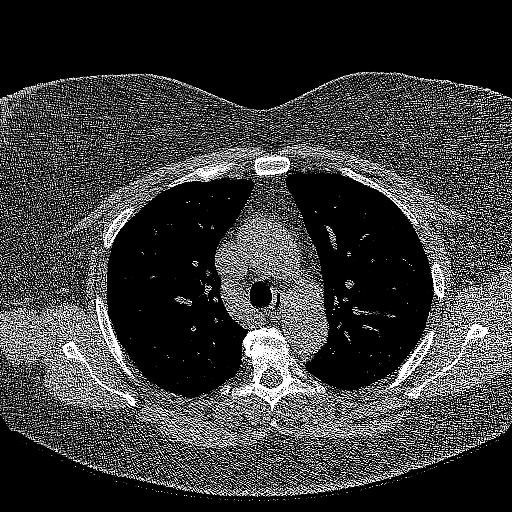
[im 220/315  lung]
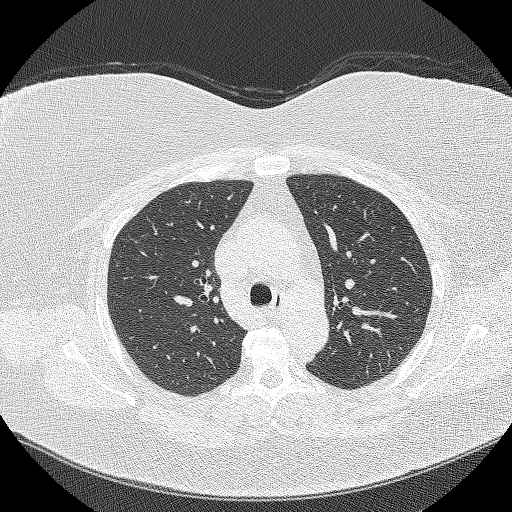
[im 252/315  lung]
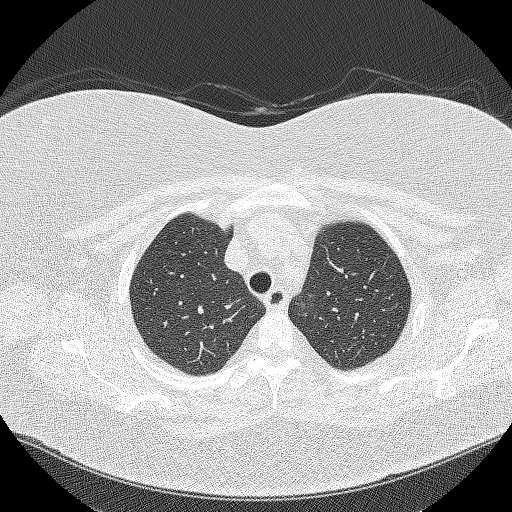
[im 267/315  lung]
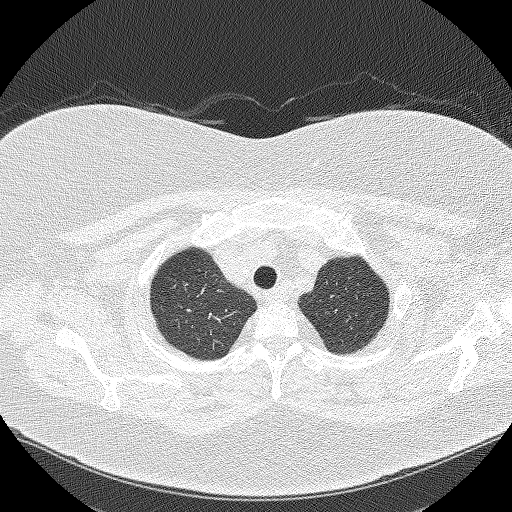
[im 299/315  lung]
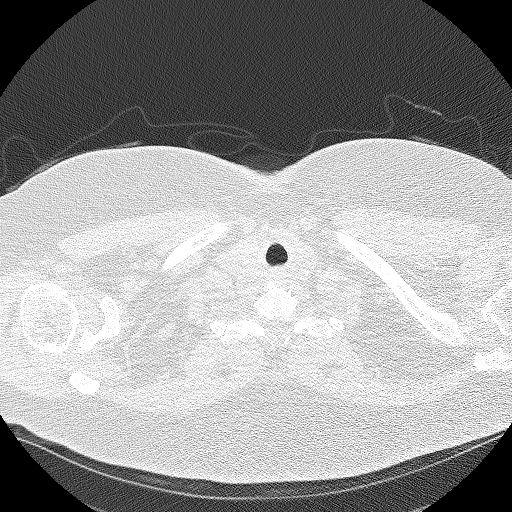

[Series 4: coronal lung · coronal · 0.62mm/px · 3 of 322 slices shown]
[im 65/322  lung]
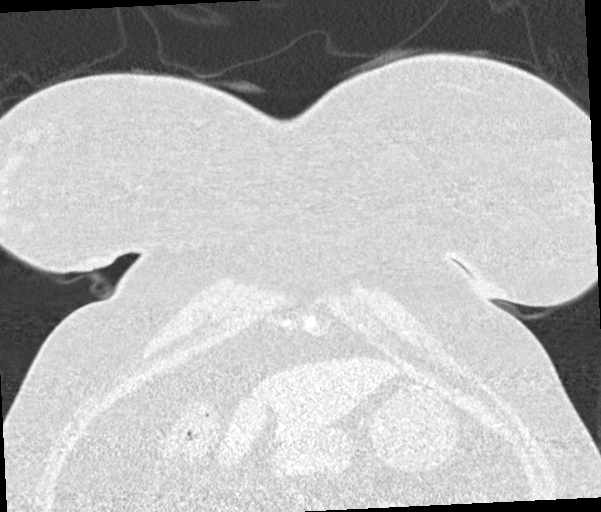
[im 129/322  lung]
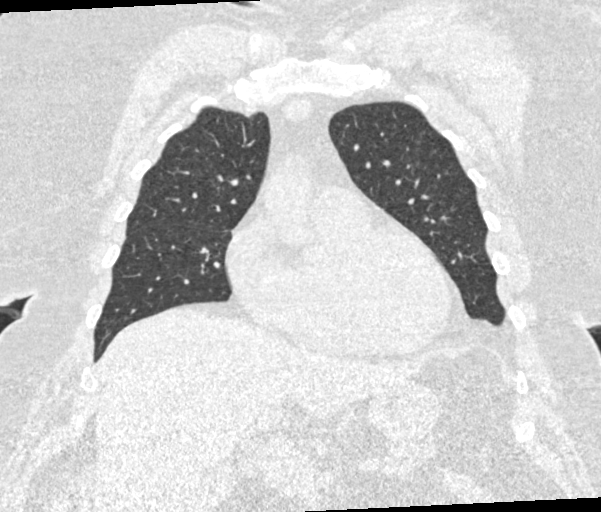
[im 193/322  lung]
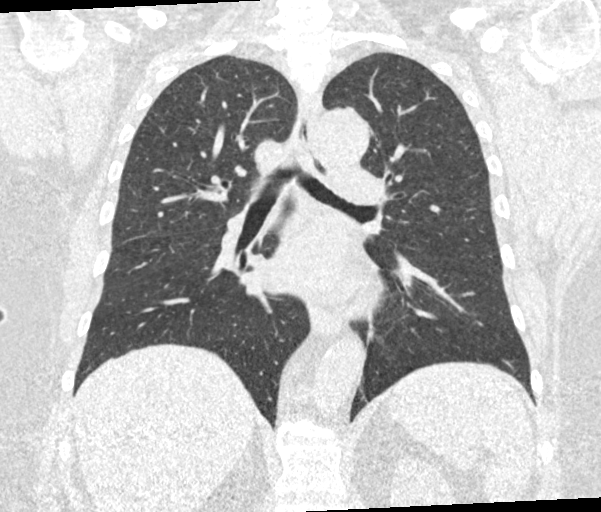

[15 of 40 positions shown; findings below may reference images not displayed]

FINDINGS: Cardiovascular: The heart size is normal. No substantial pericardial
effusion. Coronary artery calcification is evident. No thoracic
aortic aneurysm.

Mediastinum/Nodes: No mediastinal lymphadenopathy. No evidence for
gross hilar lymphadenopathy although assessment is limited by the
lack of intravenous contrast on today's study. The esophagus has
normal imaging features. There is no axillary lymphadenopathy.

Lungs/Pleura: The previously identified 3 mm apically right upper
lobe pulmonary nodule is stable. No new suspicious nodule or mass.
No focal consolidation. No pleural effusion.

Upper Abdomen: Unremarkable. Previously described left adrenal
nodule is unchanged.

Musculoskeletal: No worrisome lytic or sclerotic osseous
abnormality.
IMPRESSION: 1. Lung-RADS 2, benign appearance or behavior. Continue annual
screening with low-dose chest CT without contrast in 12 months.

## 2018-12-26 ENCOUNTER — Encounter: Payer: Self-pay | Admitting: *Deleted

## 2018-12-27 ENCOUNTER — Ambulatory Visit: Payer: 59 | Admitting: Family Medicine

## 2018-12-27 ENCOUNTER — Encounter: Payer: Self-pay | Admitting: Family Medicine

## 2018-12-27 ENCOUNTER — Encounter: Payer: 59 | Admitting: Family Medicine

## 2018-12-27 ENCOUNTER — Other Ambulatory Visit: Payer: Self-pay

## 2018-12-27 VITALS — BP 136/74 | HR 76 | Temp 98.5°F | Resp 16 | Wt 234.0 lb

## 2018-12-27 DIAGNOSIS — Z720 Tobacco use: Secondary | ICD-10-CM | POA: Diagnosis not present

## 2018-12-27 DIAGNOSIS — Z96659 Presence of unspecified artificial knee joint: Secondary | ICD-10-CM

## 2018-12-27 DIAGNOSIS — I10 Essential (primary) hypertension: Secondary | ICD-10-CM | POA: Diagnosis not present

## 2018-12-27 DIAGNOSIS — Z96652 Presence of left artificial knee joint: Secondary | ICD-10-CM | POA: Diagnosis not present

## 2018-12-27 DIAGNOSIS — M1712 Unilateral primary osteoarthritis, left knee: Secondary | ICD-10-CM

## 2018-12-27 NOTE — Progress Notes (Signed)
Patient: Ruth Higgins Female    DOB: [birth year]   63 y.o.   MRN: [id number] Visit Date: 12/27/2018  Today's Provider: [name], MD   Chief Complaint  Patient presents with  . Hypertension  . Leg Swelling    bilateral   Subjective:   HPI  Hypertension, follow-up:  BP Readings from Last 3 Encounters:  12/27/18 136/74  11/22/18 (!) 168/98  07/06/18 134/63    She was last seen for hypertension 6 months ago. However, she saw Vernie Murders, PA about 1 month ago, and was added on furosemide 20mg  daily to help with BP and lower extremity swelling.   BP at that visit was 168/98.  She reports poor compliance with treatment. Patient reports that she did not take the furosemide due to possible side effects. She reports that she is allergic to sulfur and it has sulfur ingredients in it.  She is adherent to low salt diet.   Outside blood pressures are not being checked. She is experiencing lower extremity edema.  Patient denies exertional chest pressure/discomfort and palpitations.   Cardiovascular risk factors include dyslipidemia.   Weight trend: increasing steadily Wt Readings from Last 3 Encounters:  12/27/18 234 lb (106.1 kg)  12/23/18 210 lb (95.3 kg)  11/22/18 238 lb (108 kg)    Current diet: well balanced   Allergies  Allergen Reactions  . Ceclor [Cefaclor] Anaphylaxis and Hives  . Demerol [Meperidine] Nausea And Vomiting  . Erythromycin     EES caused jaundice like symptoms   . Flagyl [Metronidazole] Swelling  . Hydrochlorothiazide     severe cramping in her legs/knee/calves  . Hydrocodone-Acetaminophen Nausea And Vomiting  . Morphine And Related     Pt prefers to not be given morphine, tolerates hydromorphone instead   . Sulfa Antibiotics Swelling  . Penicillins Hives and Rash    Has patient had a PCN reaction causing immediate rash, facial/tongue/throat swelling, SOB or lightheadedness with hypotension: Yes Has patient had a PCN  reaction causing severe rash involving mucus membranes or skin necrosis: Yes Has patient had a PCN reaction that required hospitalization: Yes Has patient had a PCN reaction occurring within the last 10 years: No If all of the above answers are "NO", then may proceed with Cephalosporin use.      Current Outpatient Medications:  .  ergocalciferol (VITAMIN D2) 1.25 MG (50000 UT) capsule, Take 50,000 Units by mouth once a week., Disp: , Rfl:  .  lisinopril (PRINIVIL,ZESTRIL) 40 MG tablet, Take 1 tablet (40 mg total) by mouth daily., Disp: 30 tablet, Rfl: 5 .  LORazepam (ATIVAN) 0.5 MG tablet, TAKE ONE TABLET BY MOUTH TWICE DAILY AS NEEDED, Disp: 60 tablet, Rfl: 1 .  metoprolol succinate (TOPROL-XL) 50 MG 24 hr tablet, Take 1 tablet (50 mg total) by mouth daily. Take with or immediately following a meal., Disp: 30 tablet, Rfl: 5 .  nicotine (NICODERM CQ - DOSED IN MG/24 HOURS) 21 mg/24hr patch, Place 21 mg onto the skin daily as needed (smoke cessation). , Disp: , Rfl:  .  Pseudoephedrine-Ibuprofen 30-200 MG TABS, Take 1 tablet by mouth every 4 (four) hours as needed., Disp: , Rfl:  .  spironolactone (ALDACTONE) 25 MG tablet, Take 1 tablet (25 mg total) by mouth daily., Disp: 30 tablet, Rfl: 0 .  traMADol (ULTRAM) 50 MG tablet, Take 1-2 tablets (50-100 mg total) by mouth every 6 (six) hours as needed for moderate pain., Disp: 60 tablet, Rfl: 0 .  enoxaparin (LOVENOX) 40 MG/0.4ML injection, Inject 0.4 mLs (40 mg total) into the skin daily for 14 days., Disp: 14 Syringe, Rfl: 0 .  furosemide (LASIX) 20 MG tablet, Take 1 tablet (20 mg total) by mouth daily. (Patient not taking: Reported on 12/27/2018), Disp: 30 tablet, Rfl: 3 .  oxyCODONE (OXY IR/ROXICODONE) 5 MG immediate release tablet, Take 1 tablet (5 mg total) by mouth every 4 (four) hours as needed for moderate pain (pain score 4-6). (Patient not taking: Reported on 11/22/2018), Disp: 30 tablet, Rfl: 0  Review of Systems  Constitutional: Negative  for activity change, appetite change, chills, diaphoresis, fatigue and fever.  Respiratory: Negative for cough and shortness of breath.   Cardiovascular: Positive for leg swelling. Negative for chest pain and palpitations.  Musculoskeletal: Negative for arthralgias and joint swelling.  Neurological: Negative for dizziness, light-headedness and headaches.  Psychiatric/Behavioral: Negative for agitation, self-injury, sleep disturbance and suicidal ideas. The patient is not nervous/anxious.     Social History   Tobacco Use  . Smoking status: Current Every Day Smoker    Packs/day: 1.00    Years: 48.00    Pack years: 48.00    Types: Cigarettes  . Smokeless tobacco: Never Used  . Tobacco comment: smokes due to stress; has quit several times  Substance Use Topics  . Alcohol use: No    Alcohol/week: 0.0 standard drinks      Objective:   BP 136/74 (BP Location: Left Arm, Patient Position: Sitting, Cuff Size: Large)   Pulse 76   Temp 98.5 F (36.9 C)   Resp 16   Wt 234 lb (106.1 kg)   SpO2 96%   BMI 44.21 kg/m  Vitals:   12/27/18 1507  BP: 136/74  Pulse: 76  Resp: 16  Temp: 98.5 F (36.9 C)  SpO2: 96%  Weight: 234 lb (106.1 kg)     Physical Exam Vitals signs reviewed.  Constitutional:      Appearance: She is obese.  HENT:     Head: Normocephalic and atraumatic.     Right Ear: External ear normal.     Left Ear: External ear normal.     Nose: Nose normal.  Eyes:     General: No scleral icterus.    Conjunctiva/sclera: Conjunctivae normal.  Cardiovascular:     Rate and Rhythm: Normal rate and regular rhythm.     Heart sounds: Normal heart sounds.  Pulmonary:     Effort: Pulmonary effort is normal.     Breath sounds: Normal breath sounds.  Abdominal:     Palpations: Abdomen is soft.  Musculoskeletal:     Comments: Trace bilateral edema. Calg size 16.5 inches bilaterally.  Lymphadenopathy:     Cervical: No cervical adenopathy.  Skin:    General: Skin is warm  and dry.  Neurological:     Mental Status: She is alert and oriented to person, place, and time. Mental status is at baseline.  Psychiatric:        Mood and Affect: Mood normal.        Behavior: Behavior normal.        Thought Content: Thought content normal.      No results found for any visits on 12/27/18.     Assessment & Plan    1. Essential (primary) hypertension On lisinopril. Take Aldactone daily ,check renal panel in 3 weeks. rtc 2 months.  2. Primary osteoarthritis of left knee Per Dr Marry Guan.  3. Status post total knee replacement, unspecified laterality   4.  Morbid obesity with body mass index (BMI) of 40.0 or higher ([place])   5. Current tobacco use Pt advised to quit    I have done the exam and reviewed the above chart and it is accurate to the best of my knowledge. Development worker, community has been used in this note in any air is in the dictation or transcription are unintentional.  [name], MD  [place]

## 2019-01-10 ENCOUNTER — Other Ambulatory Visit: Payer: Self-pay

## 2019-01-10 DIAGNOSIS — I10 Essential (primary) hypertension: Secondary | ICD-10-CM | POA: Diagnosis not present

## 2019-01-10 NOTE — Progress Notes (Unsigned)
Renal

## 2019-01-11 LAB — RENAL FUNCTION PANEL
Albumin: 4 g/dL (ref 3.8–4.8)
BUN/Creatinine Ratio: 13 (ref 12–28)
BUN: 13 mg/dL (ref 8–27)
CO2: 23 mmol/L (ref 20–29)
Calcium: 9.3 mg/dL (ref 8.7–10.3)
Chloride: 104 mmol/L (ref 96–106)
Creatinine, Ser: 0.99 mg/dL (ref 0.57–1.00)
GFR calc Af Amer: 70 mL/min/{1.73_m2} (ref >59)
GFR calc non Af Amer: 61 mL/min/{1.73_m2} (ref >59)
Glucose: 101 mg/dL — ABNORMAL HIGH (ref 65–99)
Phosphorus: 3.4 mg/dL (ref 3.0–4.3)
Potassium: 4.1 mmol/L (ref 3.5–5.2)
Sodium: 142 mmol/L (ref 134–144)

## 2019-01-12 ENCOUNTER — Telehealth: Payer: Self-pay | Admitting: Family Medicine

## 2019-01-12 NOTE — Telephone Encounter (Signed)
Possible to have fatigue from the Spironolactone if it caused the BP to go too low and lose a lot of fluids. Usually does not happen if drinking enough fluids while taking it. Should stop it and recheck BP and weight in the office.

## 2019-01-12 NOTE — Telephone Encounter (Signed)
Called patient to inform her of lab results. Pt expressed that she had been taking spironolactone (ALDACTONE) 25 MG tablet as prescribed by Simona Huh. Patient states that she took it for 2 weeks and during that time she felt awful while taking it. Patient states that she felt very fatigued and that she had no energy. She states that it took everything she had to get up off the couch and get around the house and then back to the couch. She did state that it did make her have to go to the bathroom more. Patient stopped taking this medication on 01/05/2019 and had her blood work done on 01/10/2019. She was considering starting it back today but wanted to know is this was normal and if there is something else she needed to take. Please advise  Thank you, [facility]

## 2019-01-12 NOTE — Telephone Encounter (Signed)
Please review. Are these symptoms (feeling weak and fatigue) normal after taking spironolactone?

## 2019-01-12 NOTE — Telephone Encounter (Signed)
-----   Message from Jerrol Banana., MD sent at 01/11/2019  8:40 AM EDT ----- Labs ok.

## 2019-01-12 NOTE — Telephone Encounter (Signed)
Left message to call back  

## 2019-02-10 DIAGNOSIS — M170 Bilateral primary osteoarthritis of knee: Secondary | ICD-10-CM | POA: Diagnosis not present

## 2019-02-10 DIAGNOSIS — Z96652 Presence of left artificial knee joint: Secondary | ICD-10-CM | POA: Diagnosis not present

## 2019-02-10 DIAGNOSIS — Z96651 Presence of right artificial knee joint: Secondary | ICD-10-CM | POA: Diagnosis not present

## 2019-02-28 ENCOUNTER — Other Ambulatory Visit: Payer: Self-pay

## 2019-02-28 ENCOUNTER — Ambulatory Visit: Payer: 59 | Admitting: Family Medicine

## 2019-02-28 ENCOUNTER — Other Ambulatory Visit: Payer: Self-pay | Admitting: Family Medicine

## 2019-02-28 ENCOUNTER — Encounter: Payer: Self-pay | Admitting: Family Medicine

## 2019-02-28 VITALS — BP 122/72 | HR 72 | Temp 98.6°F | Resp 16 | Ht 61.0 in | Wt 239.0 lb

## 2019-02-28 DIAGNOSIS — E559 Vitamin D deficiency, unspecified: Secondary | ICD-10-CM | POA: Diagnosis not present

## 2019-02-28 DIAGNOSIS — R7303 Prediabetes: Secondary | ICD-10-CM | POA: Diagnosis not present

## 2019-02-28 DIAGNOSIS — I10 Essential (primary) hypertension: Secondary | ICD-10-CM | POA: Diagnosis not present

## 2019-02-28 DIAGNOSIS — E785 Hyperlipidemia, unspecified: Secondary | ICD-10-CM | POA: Diagnosis not present

## 2019-02-28 NOTE — Progress Notes (Signed)
Patient: Ruth Higgins Female    DOB: 1956-01-16   63 y.o.   MRN: [id number] Visit Date: 02/28/2019  Today's Provider: [name], MD   Chief Complaint  Patient presents with  . Hypertension   Subjective:   HPI  Hypertension, follow-up:  BP Readings from Last 3 Encounters:  02/28/19 122/72  12/27/18 136/74  11/22/18 (!) 168/98    She was last seen for hypertension 2 months ago.  BP at that visit was 136/74. Management since that visit includes starting spironolactone 25mg  daily . She reports poor compliance with treatment. Patient reports that she is no longer taking this because it made her feel drained. She reports that the swelling in her legs is much better and her BP has been stable.    Weight trend: stable Wt Readings from Last 3 Encounters:  02/28/19 239 lb (108.4 kg)  12/27/18 234 lb (106.1 kg)  12/23/18 210 lb (95.3 kg)   Current diet: well balanced   Allergies  Allergen Reactions  . Ceclor [Cefaclor] Anaphylaxis and Hives  . Demerol [Meperidine] Nausea And Vomiting  . Erythromycin     EES caused jaundice like symptoms   . Flagyl [Metronidazole] Swelling  . Hydrochlorothiazide     severe cramping in her legs/knee/calves  . Hydrocodone-Acetaminophen Nausea And Vomiting  . Morphine And Related     Pt prefers to not be given morphine, tolerates hydromorphone instead   . Sulfa Antibiotics Swelling  . Penicillins Hives and Rash    Has patient had a PCN reaction causing immediate rash, facial/tongue/throat swelling, SOB or lightheadedness with hypotension: Yes Has patient had a PCN reaction causing severe rash involving mucus membranes or skin necrosis: Yes Has patient had a PCN reaction that required hospitalization: Yes Has patient had a PCN reaction occurring within the last 10 years: No If all of the above answers are "NO", then may proceed with Cephalosporin use.      Current Outpatient Medications:  .  ergocalciferol (VITAMIN  D2) 1.25 MG (50000 UT) capsule, Take 50,000 Units by mouth once a week., Disp: , Rfl:  .  lisinopril (PRINIVIL,ZESTRIL) 40 MG tablet, Take 1 tablet (40 mg total) by mouth daily., Disp: 30 tablet, Rfl: 5 .  LORazepam (ATIVAN) 0.5 MG tablet, TAKE ONE TABLET BY MOUTH TWICE DAILY AS NEEDED, Disp: 60 tablet, Rfl: 1 .  metoprolol succinate (TOPROL-XL) 50 MG 24 hr tablet, Take 1 tablet (50 mg total) by mouth daily. Take with or immediately following a meal., Disp: 30 tablet, Rfl: 5 .  Pseudoephedrine-Ibuprofen 30-200 MG TABS, Take 1 tablet by mouth every 4 (four) hours as needed., Disp: , Rfl:  .  spironolactone (ALDACTONE) 25 MG tablet, Take 1 tablet (25 mg total) by mouth daily., Disp: 30 tablet, Rfl: 0 .  traMADol (ULTRAM) 50 MG tablet, Take 1-2 tablets (50-100 mg total) by mouth every 6 (six) hours as needed for moderate pain., Disp: 60 tablet, Rfl: 0 .  enoxaparin (LOVENOX) 40 MG/0.4ML injection, Inject 0.4 mLs (40 mg total) into the skin daily for 14 days., Disp: 14 Syringe, Rfl: 0 .  furosemide (LASIX) 20 MG tablet, Take 1 tablet (20 mg total) by mouth daily. (Patient not taking: Reported on 12/27/2018), Disp: 30 tablet, Rfl: 3 .  nicotine (NICODERM CQ - DOSED IN MG/24 HOURS) 21 mg/24hr patch, Place 21 mg onto the skin daily as needed (smoke cessation). , Disp: , Rfl:  .  oxyCODONE (OXY IR/ROXICODONE) 5 MG immediate release tablet,  Take 1 tablet (5 mg total) by mouth every 4 (four) hours as needed for moderate pain (pain score 4-6). (Patient not taking: Reported on 11/22/2018), Disp: 30 tablet, Rfl: 0  Review of Systems  Constitutional: Negative for activity change, appetite change, chills, diaphoresis, fatigue and fever.  HENT: Negative.   Eyes: Negative.   Respiratory: Negative for cough and shortness of breath.   Cardiovascular: Negative for chest pain, palpitations and leg swelling.  Gastrointestinal: Negative.   Endocrine: Negative.   Musculoskeletal: Negative for arthralgias and joint  swelling.  Allergic/Immunologic: Negative for environmental allergies.  Neurological: Negative for dizziness, light-headedness and headaches.  Psychiatric/Behavioral: Negative for agitation, self-injury, sleep disturbance and suicidal ideas. The patient is not nervous/anxious.     Social History   Tobacco Use  . Smoking status: Current Every Day Smoker    Packs/day: 1.00    Years: 48.00    Pack years: 48.00    Types: Cigarettes  . Smokeless tobacco: Never Used  . Tobacco comment: smokes due to stress; has quit several times  Substance Use Topics  . Alcohol use: No    Alcohol/week: 0.0 standard drinks      Objective:   BP 122/72   Pulse 72   Temp 98.6 F (37 C)   Resp 16   Ht 5\' 1"  (1.549 m)   Wt 239 lb (108.4 kg)   SpO2 99%   BMI 45.16 kg/m  Vitals:   02/28/19 1132  BP: 122/72  Pulse: 72  Resp: 16  Temp: 98.6 F (37 C)  SpO2: 99%  Weight: 239 lb (108.4 kg)  Height: 5\' 1"  (1.549 m)     Physical Exam Vitals signs reviewed.  Constitutional:      Appearance: She is obese.  HENT:     Head: Normocephalic and atraumatic.     Right Ear: External ear normal.     Left Ear: External ear normal.     Nose: Nose normal.  Eyes:     General: No scleral icterus.    Conjunctiva/sclera: Conjunctivae normal.  Cardiovascular:     Rate and Rhythm: Normal rate and regular rhythm.     Heart sounds: Normal heart sounds.  Pulmonary:     Effort: Pulmonary effort is normal.     Breath sounds: Normal breath sounds.  Abdominal:     Palpations: Abdomen is soft.  Musculoskeletal:     Comments: Trace bilateral edema. Calg size 16.5 inches bilaterally.  Lymphadenopathy:     Cervical: No cervical adenopathy.  Skin:    General: Skin is warm and dry.  Neurological:     Mental Status: She is alert and oriented to person, place, and time. Mental status is at baseline.  Psychiatric:        Mood and Affect: Mood normal.        Behavior: Behavior normal.        Thought Content:  Thought content normal.      No results found for any visits on 02/28/19.     Assessment & Plan    1. Essential (primary) hypertension   2. Vitamin D deficiency  - VITAMIN D 25 Hydroxy (Vit-D Deficiency, Fractures)  3. Pre-diabetes  - Hemoglobin A1c  4. Hyperlipidemia, unspecified hyperlipidemia type  - Lipid panel  5. Morbid obesity with body mass index (BMI) of 40.0 or higher ([facility]) With HTN/OA     [name], MD  [facility]

## 2019-03-28 DIAGNOSIS — E785 Hyperlipidemia, unspecified: Secondary | ICD-10-CM | POA: Diagnosis not present

## 2019-03-28 DIAGNOSIS — R7303 Prediabetes: Secondary | ICD-10-CM | POA: Diagnosis not present

## 2019-03-28 DIAGNOSIS — E559 Vitamin D deficiency, unspecified: Secondary | ICD-10-CM | POA: Diagnosis not present

## 2019-03-29 ENCOUNTER — Telehealth: Payer: Self-pay

## 2019-03-29 LAB — VITAMIN D 25 HYDROXY (VIT D DEFICIENCY, FRACTURES): Vit D, 25-Hydroxy: 16.9 ng/mL — ABNORMAL LOW (ref 30.0–100.0)

## 2019-03-29 LAB — HEMOGLOBIN A1C
Est. average glucose Bld gHb Est-mCnc: 160 mg/dL
Hgb A1c MFr Bld: 7.2 % — ABNORMAL HIGH (ref 4.8–5.6)

## 2019-03-29 LAB — LIPID PANEL
Chol/HDL Ratio: 6.1 ratio — ABNORMAL HIGH (ref 0.0–4.4)
Cholesterol, Total: 176 mg/dL (ref 100–199)
HDL: 29 mg/dL — ABNORMAL LOW (ref >39)
LDL Chol Calc (NIH): 118 mg/dL — ABNORMAL HIGH (ref 0–99)
Triglycerides: 162 mg/dL — ABNORMAL HIGH (ref 0–149)
VLDL Cholesterol Cal: 29 mg/dL (ref 5–40)

## 2019-03-29 NOTE — Telephone Encounter (Signed)
Patient notified of lab results. Explained that she needs to start Metformin due to her elevated A1C, but she said she would like to try to change her diet first. She states that she has been eating [employer] oatmeal pies every night for the last month. Will call back to schedule follow up appointment after she checks her work schedule.

## 2019-03-29 NOTE — Telephone Encounter (Signed)
-----   Message from Jerrol Banana., MD sent at 03/29/2019  8:45 AM EDT ----- Vitamin D level improving.  Continue vitamin D dosing.  Now diabetic.  Would start metformin 500 mg daily and on next visit we will discuss starting statin.  Follow-up 3 months.

## 2019-03-30 ENCOUNTER — Other Ambulatory Visit: Payer: Self-pay

## 2019-03-30 ENCOUNTER — Ambulatory Visit (INDEPENDENT_AMBULATORY_CARE_PROVIDER_SITE_OTHER): Payer: 59 | Admitting: Family Medicine

## 2019-03-30 ENCOUNTER — Encounter: Payer: Self-pay | Admitting: Family Medicine

## 2019-03-30 VITALS — BP 159/86 | HR 71 | Temp 96.9°F | Ht 61.0 in | Wt 234.6 lb

## 2019-03-30 DIAGNOSIS — R739 Hyperglycemia, unspecified: Secondary | ICD-10-CM

## 2019-03-30 DIAGNOSIS — Z1239 Encounter for other screening for malignant neoplasm of breast: Secondary | ICD-10-CM | POA: Diagnosis not present

## 2019-03-30 DIAGNOSIS — I10 Essential (primary) hypertension: Secondary | ICD-10-CM | POA: Diagnosis not present

## 2019-03-30 DIAGNOSIS — Z720 Tobacco use: Secondary | ICD-10-CM | POA: Diagnosis not present

## 2019-03-30 DIAGNOSIS — Z0000 Encounter for general adult medical examination without abnormal findings: Secondary | ICD-10-CM

## 2019-03-30 NOTE — Assessment & Plan Note (Signed)
Elevated today Well controlled when saw PCP a few weeks ago Seems to be more nervous when seeing me No changes to medications Will f/u with PCP in 3 months

## 2019-03-30 NOTE — Progress Notes (Signed)
Patient: Ruth Higgins, Female    DOB: 1956/02/22, 63 y.o.   MRN: [id number] Visit Date: 03/30/2019  Today's Provider: Lavon Paganini, MD   Chief Complaint  Patient presents with  . Annual Exam   Subjective:    I, Tiburcio Pea, CMA, am acting as a scribe for Lavon Paganini, MD.     Annual physical exam Ruth Higgins is a 63 y.o. female who presents today for health maintenance and complete physical. She feels fairly well. She reports exercising none at this time. She reports she is sleeping poorly.  She wakes every 3 hours to urinate.  Stopped Lasix (was taking for LE edema)  -----------------------------------------------------------------  Review of Systems  Constitutional: Negative.   HENT: Negative.   Eyes: Negative.   Respiratory: Negative.   Cardiovascular: Negative.   Gastrointestinal: Negative.   Endocrine: Negative.   Genitourinary: Negative.   Musculoskeletal: Negative.   Skin: Negative.   Allergic/Immunologic: Negative.   Neurological: Negative.   Hematological: Negative.   Psychiatric/Behavioral: Negative.     Social History      She  reports that she has been smoking cigarettes. She has a 48.00 pack-year smoking history. She has never used smokeless tobacco. She reports that she does not drink alcohol or use drugs.       Social History   Socioeconomic History  . Marital status: Married    Spouse name: Not on file  . Number of children: Not on file  . Years of education: Not on file  . Highest education level: Not on file  Occupational History  . Not on file  Social Needs  . Financial resource strain: Not on file  . Food insecurity    Worry: Not on file    Inability: Not on file  . Transportation needs    Medical: Not on file    Non-medical: Not on file  Tobacco Use  . Smoking status: Current Every Day Smoker    Packs/day: 1.00    Years: 48.00    Pack years: 48.00    Types: Cigarettes  . Smokeless tobacco: Never Used  .  Tobacco comment: smokes due to stress; has quit several times  Substance and Sexual Activity  . Alcohol use: No    Alcohol/week: 0.0 standard drinks  . Drug use: No  . Sexual activity: Not on file  Lifestyle  . Physical activity    Days per week: Not on file    Minutes per session: Not on file  . Stress: Not on file  Relationships  . Social Herbalist on phone: Not on file    Gets together: Not on file    Attends religious service: Not on file    Active member of club or organization: Not on file    Attends meetings of clubs or organizations: Not on file    Relationship status: Not on file  Other Topics Concern  . Not on file  Social History Narrative  . Not on file    Past Medical History:  Diagnosis Date  . Arthritis   . Family history of adverse reaction to anesthesia    brother hard to wake up after surgery  . Hypertension   . PONV (postoperative nausea and vomiting) 1980s  . Pre-diabetes      Patient Active Problem List   Diagnosis Date Noted  . Total knee replacement status 07/04/2018  . Edema of extremities 02/09/2018  . Status post total right knee replacement 01/31/2018  .  Personal history of tobacco use, presenting hazards to health 12/01/2016  . Encounter for screening colonoscopy 12/06/2015  . Abdominal mass 11/23/2015  . Neck mass 11/23/2015  . Hyperglycemia 05/20/2015  . Anxiety, mild 03/15/2015  . Allergic rhinitis 03/15/2015  . Depression, major, single episode, mild ([place]) 03/15/2015  . Essential (primary) hypertension 03/15/2015  . Fam hx-polycystic kidney 03/15/2015  . HLD (hyperlipidemia) 03/15/2015  . Cannot sleep 03/15/2015  . Menopause 03/15/2015  . Primary osteoarthritis of left knee 03/15/2015  . Morbid obesity with body mass index (BMI) of 40.0 or higher ([place]) 03/15/2015  . Current tobacco use 03/15/2015    Past Surgical History:  Procedure Laterality Date  . ABDOMINAL HYSTERECTOMY  2014   Dr. Sabra Heck  . APPENDECTOMY     . HERNIA REPAIR  2014   Dr. Pat Patrick  . KNEE ARTHROPLASTY Right 01/31/2018   Procedure: COMPUTER ASSISTED TOTAL KNEE ARTHROPLASTY;  Surgeon: Dereck Leep, MD;  Location: [facility] ORS;  Service: Orthopedics;  Laterality: Right;  . KNEE ARTHROPLASTY Left 07/04/2018   Procedure: COMPUTER ASSISTED TOTAL KNEE ARTHROPLASTY;  Surgeon: Dereck Leep, MD;  Location: [facility] ORS;  Service: Orthopedics;  Laterality: Left;  . KNEE SURGERY Bilateral 2007  . LIPOMA EXCISION  1999   of the left side of the neck  . NECK SURGERY    . SALPINGOOPHORECTOMY Bilateral   . TONSILLECTOMY    . TUBAL LIGATION      Family History        Family Status  Relation Name Status  . Father  Deceased  . Sister  Alive  . Brother  Alive  . Sister  Alive  . Mother  Alive  . Brother  (Not Specified)        Her family history includes Alzheimer's disease in her father; Anxiety disorder in her sister; Depression in her sister; Heart disease in her father; Hypertension in her brother and sister; Hypothyroidism in her sister; Polycystic kidney disease in her brother; Rectal cancer in her brother; Stroke in her mother.      Allergies  Allergen Reactions  . Ceclor [Cefaclor] Anaphylaxis and Hives  . Demerol [Meperidine] Nausea And Vomiting  . Erythromycin     EES caused jaundice like symptoms   . Flagyl [Metronidazole] Swelling  . Hydrochlorothiazide     severe cramping in her legs/knee/calves  . Hydrocodone-Acetaminophen Nausea And Vomiting  . Morphine And Related     Pt prefers to not be given morphine, tolerates hydromorphone instead   . Sulfa Antibiotics Swelling  . Penicillins Hives and Rash    Has patient had a PCN reaction causing immediate rash, facial/tongue/throat swelling, SOB or lightheadedness with hypotension: Yes Has patient had a PCN reaction causing severe rash involving mucus membranes or skin necrosis: Yes Has patient had a PCN reaction that required hospitalization: Yes Has patient had a PCN reaction  occurring within the last 10 years: No If all of the above answers are "NO", then may proceed with Cephalosporin use.      Current Outpatient Medications:  .  ergocalciferol (VITAMIN D2) 1.25 MG (50000 UT) capsule, Take 50,000 Units by mouth once a week., Disp: , Rfl:  .  lisinopril (ZESTRIL) 40 MG tablet, TAKE 1 TABLET BY MOUTH DAILY., Disp: 30 tablet, Rfl: 5 .  LORazepam (ATIVAN) 0.5 MG tablet, TAKE 1 TABLET BY MOUTH TWICE DAILY AS NEEDED, Disp: 60 tablet, Rfl: 1 .  metoprolol succinate (TOPROL-XL) 50 MG 24 hr tablet, TAKE 1 TABLET BY MOUTH ONCE DAILY. TAKE  WITH OR IMMEDIATELY FOLLOWING A MEAL., Disp: 30 tablet, Rfl: 5 .  nicotine (NICODERM CQ - DOSED IN MG/24 HOURS) 21 mg/24hr patch, Place 21 mg onto the skin daily as needed (smoke cessation). , Disp: , Rfl:  .  Pseudoephedrine-Ibuprofen 30-200 MG TABS, Take 1 tablet by mouth every 4 (four) hours as needed., Disp: , Rfl:  .  spironolactone (ALDACTONE) 25 MG tablet, Take 1 tablet (25 mg total) by mouth daily., Disp: 30 tablet, Rfl: 0 .  traMADol (ULTRAM) 50 MG tablet, Take 1-2 tablets (50-100 mg total) by mouth every 6 (six) hours as needed for moderate pain., Disp: 60 tablet, Rfl: 0 .  enoxaparin (LOVENOX) 40 MG/0.4ML injection, Inject 0.4 mLs (40 mg total) into the skin daily for 14 days., Disp: 14 Syringe, Rfl: 0   Patient Care Team: Jerrol Banana., MD as PCP - General (Family Medicine) Jerrol Banana., MD (Family Medicine) Bary Castilla, Forest Gleason, MD (General Surgery)    Objective:    Vitals: BP (!) 159/86 (BP Location: Right Arm, Patient Position: Sitting, Cuff Size: Large)   Pulse 71   Temp (!) 96.9 F (36.1 C) (Temporal)   Ht 5\' 1"  (1.549 m)   Wt 234 lb 9.6 oz (106.4 kg)   SpO2 99%   BMI 44.33 kg/m    Vitals:   03/30/19 1356  BP: (!) 159/86  Pulse: 71  Temp: (!) 96.9 F (36.1 C)  TempSrc: Temporal  SpO2: 99%  Weight: 234 lb 9.6 oz (106.4 kg)  Height: 5\' 1"  (1.549 m)     Physical Exam Vitals signs  reviewed.  Constitutional:      General: She is not in acute distress.    Appearance: Normal appearance. She is well-developed. She is not diaphoretic.  HENT:     Head: Normocephalic and atraumatic.     Right Ear: Tympanic membrane, ear canal and external ear normal.     Left Ear: Tympanic membrane, ear canal and external ear normal.  Eyes:     General: No scleral icterus.    Conjunctiva/sclera: Conjunctivae normal.     Pupils: Pupils are equal, round, and reactive to light.  Neck:     Musculoskeletal: Neck supple.     Thyroid: No thyromegaly.  Cardiovascular:     Rate and Rhythm: Normal rate and regular rhythm.     Pulses: Normal pulses.     Heart sounds: Normal heart sounds. No murmur.  Pulmonary:     Effort: Pulmonary effort is normal. No respiratory distress.     Breath sounds: Normal breath sounds. No wheezing or rales.  Abdominal:     General: There is no distension.     Palpations: Abdomen is soft.     Tenderness: There is no abdominal tenderness.  Musculoskeletal:        General: No deformity.     Right lower leg: No edema.     Left lower leg: No edema.  Lymphadenopathy:     Cervical: No cervical adenopathy.  Skin:    General: Skin is warm and dry.     Capillary Refill: Capillary refill takes less than 2 seconds.     Findings: No rash.  Neurological:     Mental Status: She is alert and oriented to person, place, and time. Mental status is at baseline.  Psychiatric:        Mood and Affect: Mood normal.        Behavior: Behavior normal.        Thought  Content: Thought content normal.      Depression Screen PHQ 2/9 Scores 06/11/2016  PHQ - 2 Score 0  Some encounter information is confidential and restricted. Go to Review Flowsheets activity to see all data.       Assessment & Plan:     Routine Health Maintenance and Physical Exam  Exercise Activities and Dietary recommendations Goals   None     Immunization History  Administered Date(s)  Administered  . Influenza,inj,Quad PF,6+ Mos 05/04/2014  . Influenza-Unspecified 05/05/2018  . Tdap 07/26/2013    Health Maintenance  Topic Date Due  . Hepatitis C Screening  02-Nov-1955  . HIV Screening  05/24/1970  . MAMMOGRAM  05/24/2005  . COLONOSCOPY  05/24/2005  . INFLUENZA VACCINE  10/04/2019 (Originally 02/04/2019)  . TETANUS/TDAP  07/27/2023     Discussed health benefits of physical activity, and encouraged her to engage in regular exercise appropriate for her age and condition.    -------------------------------------------------------------------- Patient declines colonoscopy, but will discuss soon with Dr Bary Castilla whom she trusts. Hesitant to get mammogram, but agrees for me to order Will get flu vaccine at work Recommend Hep C and HIV screening with next labs  Problem List Items Addressed This Visit      Cardiovascular and Mediastinum   Essential (primary) hypertension    Elevated today Well controlled when saw PCP a few weeks ago Seems to be more nervous when seeing me No changes to medications Will f/u with PCP in 3 months        Other   Morbid obesity with body mass index (BMI) of 40.0 or higher ([place])    Discussed importance of healthy weight management Discussed diet and exercise       Current tobacco use    3-5 minute discussion regarding harms of smoking, benefits of cessation, and methods of cessation Patient remains precontemplative      Hyperglycemia    Recently diagnosed with T2DM Refused medications Discussed dietary changes and exercise F/u with PCP in 3 months as planned       Other Visit Diagnoses    Encounter for annual physical exam    -  Primary   Screening for breast cancer       Relevant Orders   MM 3D SCREEN BREAST BILATERAL       Return in about 3 months (around 06/29/2019) for chronic disease f/u with PCP.   The entirety of the information documented in the History of Present Illness, Review of Systems and Physical  Exam were personally obtained by me. Portions of this information were initially documented by Tiburcio Pea, CMA and reviewed by me for thoroughness and accuracy.    Hersh Minney, Dionne Bucy, MD MPH [facility]

## 2019-03-30 NOTE — Assessment & Plan Note (Signed)
Recently diagnosed with T2DM Refused medications Discussed dietary changes and exercise F/u with PCP in 3 months as planned

## 2019-03-30 NOTE — Assessment & Plan Note (Signed)
Discussed importance of healthy weight management Discussed diet and exercise  

## 2019-03-30 NOTE — Patient Instructions (Signed)
Preventive Care 40-64 Years Old, Female Preventive care refers to visits with your health care provider and lifestyle choices that can promote health and wellness. This includes:  A yearly physical exam. This may also be called an annual well check.  Regular dental visits and eye exams.  Immunizations.  Screening for certain conditions.  Healthy lifestyle choices, such as eating a healthy diet, getting regular exercise, not using drugs or products that contain nicotine and tobacco, and limiting alcohol use. What can I expect for my preventive care visit? Physical exam Your health care provider will check your:  Height and weight. This may be used to calculate body mass index (BMI), which tells if you are at a healthy weight.  Heart rate and blood pressure.  Skin for abnormal spots. Counseling Your health care provider may ask you questions about your:  Alcohol, tobacco, and drug use.  Emotional well-being.  Home and relationship well-being.  Sexual activity.  Eating habits.  Work and work environment.  Method of birth control.  Menstrual cycle.  Pregnancy history. What immunizations do I need?  Influenza (flu) vaccine  This is recommended every year. Tetanus, diphtheria, and pertussis (Tdap) vaccine  You may need a Td booster every 10 years. Varicella (chickenpox) vaccine  You may need this if you have not been vaccinated. Zoster (shingles) vaccine  You may need this after age 60. Measles, mumps, and rubella (MMR) vaccine  You may need at least one dose of MMR if you were born in 1957 or later. You may also need a second dose. Pneumococcal conjugate (PCV13) vaccine  You may need this if you have certain conditions and were not previously vaccinated. Pneumococcal polysaccharide (PPSV23) vaccine  You may need one or two doses if you smoke cigarettes or if you have certain conditions. Meningococcal conjugate (MenACWY) vaccine  You may need this if you  have certain conditions. Hepatitis A vaccine  You may need this if you have certain conditions or if you travel or work in places where you may be exposed to hepatitis A. Hepatitis B vaccine  You may need this if you have certain conditions or if you travel or work in places where you may be exposed to hepatitis B. Haemophilus influenzae type b (Hib) vaccine  You may need this if you have certain conditions. Human papillomavirus (HPV) vaccine  If recommended by your health care provider, you may need three doses over 6 months. You may receive vaccines as individual doses or as more than one vaccine together in one shot (combination vaccines). Talk with your health care provider about the risks and benefits of combination vaccines. What tests do I need? Blood tests  Lipid and cholesterol levels. These may be checked every 5 years, or more frequently if you are over 50 years old.  Hepatitis C test.  Hepatitis B test. Screening  Lung cancer screening. You may have this screening every year starting at age 55 if you have a 30-pack-year history of smoking and currently smoke or have quit within the past 15 years.  Colorectal cancer screening. All adults should have this screening starting at age 50 and continuing until age 75. Your health care provider may recommend screening at age 45 if you are at increased risk. You will have tests every 1-10 years, depending on your results and the type of screening test.  Diabetes screening. This is done by checking your blood sugar (glucose) after you have not eaten for a while (fasting). You may have this   done every 1-3 years.  Mammogram. This may be done every 1-2 years. Talk with your health care provider about when you should start having regular mammograms. This may depend on whether you have a family history of breast cancer.  BRCA-related cancer screening. This may be done if you have a family history of breast, ovarian, tubal, or peritoneal  cancers.  Pelvic exam and Pap test. This may be done every 3 years starting at age 6. Starting at age 63, this may be done every 5 years if you have a Pap test in combination with an HPV test. Other tests  Sexually transmitted disease (STD) testing.  Bone density scan. This is done to screen for osteoporosis. You may have this scan if you are at high risk for osteoporosis. Follow these instructions at home: Eating and drinking  Eat a diet that includes fresh fruits and vegetables, whole grains, lean protein, and low-fat dairy.  Take vitamin and mineral supplements as recommended by your health care provider.  Do not drink alcohol if: ? Your health care provider tells you not to drink. ? You are pregnant, may be pregnant, or are planning to become pregnant.  If you drink alcohol: ? Limit how much you have to 0-1 drink a day. ? Be aware of how much alcohol is in your drink. In the [place] one drink equals one 12 oz bottle of beer (355 mL), one 5 oz glass of wine (148 mL), or one 1 oz glass of hard liquor (44 mL). Lifestyle  Take daily care of your teeth and gums.  Stay active. Exercise for at least 30 minutes on 5 or more days each week.  Do not use any products that contain nicotine or tobacco, such as cigarettes, e-cigarettes, and chewing tobacco. If you need help quitting, ask your health care provider.  If you are sexually active, practice safe sex. Use a condom or other form of birth control (contraception) in order to prevent pregnancy and STIs (sexually transmitted infections).  If told by your health care provider, take low-dose aspirin daily starting at age 32. What's next?  Visit your health care provider once a year for a well check visit.  Ask your health care provider how often you should have your eyes and teeth checked.  Stay up to date on all vaccines. This information is not intended to replace advice given to you by your health care provider. Make sure you  discuss any questions you have with your health care provider. Document Released: 07/19/2015 Document Revised: 03/03/2018 Document Reviewed: 03/03/2018 Elsevier Patient Education  [place].    Diet Recommendations for Diabetes   Starchy (carb) foods include: Bread, rice, pasta, potatoes, corn, crackers, bagels, muffins, all baked goods.  (Fruits, milk, and yogurt also have carbohydrate, but most of these foods will not spike your blood sugar as the starchy foods will.)  A few fruits do cause high blood sugars; use small portions of bananas (limit to 1/2 at a time), grapes, watermelon, and most tropical fruits.    Protein foods include: Meat, fish, poultry, eggs, dairy foods, and beans such as pinto and kidney beans (beans also provide carbohydrate).     1. Limit starchy foods to TWO per meal and ONE per snack. ONE portion of a starchy  food is equal to the following:   - ONE slice of bread (or its equivalent, such as half of a hamburger bun).   - 1/2 cup of a "scoopable" starchy food such as  potatoes or rice.   - 15 grams of carbohydrate as shown on food label.  2. Include at every meal: a protein food, a carb food, and vegetables and/or fruit.   - Obtain twice as many veg's as protein or carbohydrate foods for both lunch and dinner.   - Fresh or frozen veg's are best.   - Try to keep frozen veg's on hand for a quick vegetable serving.

## 2019-03-30 NOTE — Assessment & Plan Note (Signed)
3-5 minute discussion regarding harms of smoking, benefits of cessation, and methods of cessation Patient remains precontemplative

## 2019-05-01 ENCOUNTER — Other Ambulatory Visit: Payer: Self-pay

## 2019-05-01 ENCOUNTER — Encounter: Payer: Self-pay | Admitting: Family Medicine

## 2019-05-01 ENCOUNTER — Ambulatory Visit (INDEPENDENT_AMBULATORY_CARE_PROVIDER_SITE_OTHER): Payer: 59 | Admitting: Family Medicine

## 2019-05-01 DIAGNOSIS — Z23 Encounter for immunization: Secondary | ICD-10-CM | POA: Diagnosis not present

## 2019-05-01 NOTE — Progress Notes (Signed)
Patient here for flu vaccination only.  I did not examine the patient.  I did review his medical history, medications, and allergies and vaccine consent form.  CMA gave vaccination. Patient tolerated well.  Virginia Crews, MD, MPH [facility] 05/01/2019 9:00 AM

## 2019-07-06 NOTE — Progress Notes (Signed)
Patient: Ruth Higgins Female    DOB: 1956-04-10   63 y.o.   MRN: [id number] Visit Date: 07/12/2019  Today's Provider: [name], MD   Chief Complaint  Patient presents with  . Follow-up  . Hypertension  . Hyperlipidemia   Subjective:     HPI   Essential (primary) hypertension From 03/30/2019-seen by Dr.Bacgialupo. Patient states she will need ot have her blood pressure medication refilled today.  She reports good compliance and has not missed any doses.  She reports being under more stress due to increased work hours.  Pre-diabetes From 03/30/2019-seen by Dr.Bacgialupo. Started metformin 500 mg daily and on next visit we will discuss starting statin. Hemoglobin A1c 7.2.  She does not check her glucose at home.  She states she has modified her diet and had been walking.   Hyperlipidemia, unspecified hyperlipidemia type From 03/30/2019-seen by Dr.Bacgialupo.  Patient had elevated lipids.  Noted to have discussion with patient today about statin therapy.  Morbid Obesity  Allergies  Allergen Reactions  . Ceclor [Cefaclor] Anaphylaxis and Hives  . Demerol [Meperidine] Nausea And Vomiting  . Erythromycin     EES caused jaundice like symptoms   . Flagyl [Metronidazole] Swelling  . Hydrochlorothiazide     severe cramping in her legs/knee/calves  . Hydrocodone-Acetaminophen Nausea And Vomiting  . Morphine And Related     Pt prefers to not be given morphine, tolerates hydromorphone instead   . Sulfa Antibiotics Swelling  . Penicillins Hives and Rash    Has patient had a PCN reaction causing immediate rash, facial/tongue/throat swelling, SOB or lightheadedness with hypotension: Yes Has patient had a PCN reaction causing severe rash involving mucus membranes or skin necrosis: Yes Has patient had a PCN reaction that required hospitalization: Yes Has patient had a PCN reaction occurring within the last 10 years: No If all of the above answers are "NO", then may  proceed with Cephalosporin use.      Current Outpatient Medications:  .  ergocalciferol (VITAMIN D2) 1.25 MG (50000 UT) capsule, Take 50,000 Units by mouth once a week., Disp: , Rfl:  .  lisinopril (ZESTRIL) 40 MG tablet, TAKE 1 TABLET BY MOUTH DAILY., Disp: 30 tablet, Rfl: 5 .  LORazepam (ATIVAN) 0.5 MG tablet, TAKE 1 TABLET BY MOUTH TWICE DAILY AS NEEDED, Disp: 60 tablet, Rfl: 1 .  metoprolol succinate (TOPROL-XL) 50 MG 24 hr tablet, TAKE 1 TABLET BY MOUTH ONCE DAILY. TAKE WITH OR IMMEDIATELY FOLLOWING A MEAL., Disp: 30 tablet, Rfl: 5 .  enoxaparin (LOVENOX) 40 MG/0.4ML injection, Inject 0.4 mLs (40 mg total) into the skin daily for 14 days., Disp: 14 Syringe, Rfl: 0 .  nicotine (NICODERM CQ - DOSED IN MG/24 HOURS) 21 mg/24hr patch, Place 21 mg onto the skin daily as needed (smoke cessation). , Disp: , Rfl:  .  Pseudoephedrine-Ibuprofen 30-200 MG TABS, Take 1 tablet by mouth every 4 (four) hours as needed., Disp: , Rfl:  .  spironolactone (ALDACTONE) 25 MG tablet, Take 1 tablet (25 mg total) by mouth daily. (Patient not taking: Reported on 07/12/2019), Disp: 30 tablet, Rfl: 0 .  traMADol (ULTRAM) 50 MG tablet, Take 1-2 tablets (50-100 mg total) by mouth every 6 (six) hours as needed for moderate pain. (Patient not taking: Reported on 07/12/2019), Disp: 60 tablet, Rfl: 0  Review of Systems  Constitutional: Negative for chills, fatigue and fever.  HENT: Negative for ear pain, sinus pain and sore throat.   Eyes: Negative.  Respiratory: Negative for chest tightness and shortness of breath.   Cardiovascular: Negative for chest pain and palpitations.  Gastrointestinal: Negative for abdominal pain, nausea and vomiting.  Endocrine: Negative for polydipsia.  Genitourinary: Negative for frequency.  Musculoskeletal: Positive for arthralgias.  Neurological: Negative for dizziness, weakness and headaches.  Psychiatric/Behavioral: Negative.     Social History   Tobacco Use  . Smoking status:  Current Every Day Smoker    Packs/day: 1.00    Years: 48.00    Pack years: 48.00    Types: Cigarettes  . Smokeless tobacco: Never Used  . Tobacco comment: smokes due to stress; has quit several times  Substance Use Topics  . Alcohol use: No    Alcohol/week: 0.0 standard drinks      Objective:   Pulse 84   Temp (!) 96.9 F (36.1 C) (Skin)   Wt 224 lb (101.6 kg)   SpO2 92%   BMI 42.32 kg/m  Vitals:   07/12/19 1339  Pulse: 84  Temp: (!) 96.9 F (36.1 C)  TempSrc: Skin  SpO2: 92%  Weight: 224 lb (101.6 kg)  Body mass index is 42.32 kg/m.  Vitals:   07/12/19 1339 07/12/19 1343  BP: (!) 178/100 (!) 164/92  Pulse: 84   Temp: (!) 96.9 F (36.1 C)   TempSrc: Skin   SpO2: 92%   Weight: 224 lb (101.6 kg)     Physical Exam Vitals reviewed.  Constitutional:      Appearance: She is obese.  HENT:     Head: Normocephalic and atraumatic.     Right Ear: External ear normal.     Left Ear: External ear normal.     Nose: Nose normal.  Eyes:     General: No scleral icterus.    Conjunctiva/sclera: Conjunctivae normal.  Cardiovascular:     Rate and Rhythm: Normal rate and regular rhythm.     Heart sounds: Normal heart sounds.  Pulmonary:     Effort: Pulmonary effort is normal.     Breath sounds: Normal breath sounds.  Abdominal:     Palpations: Abdomen is soft.  Musculoskeletal:     Comments: Trace edema.  Lymphadenopathy:     Cervical: No cervical adenopathy.  Skin:    General: Skin is warm and dry.  Neurological:     Mental Status: She is alert and oriented to person, place, and time. Mental status is at baseline.  Psychiatric:        Mood and Affect: Mood normal.        Behavior: Behavior normal.        Thought Content: Thought content normal.      No results found for any visits on 07/12/19.     Assessment & Plan    1. Type 2 diabetes mellitus without complication, without long-term current use of insulin (HCC) 6.2 today--was 7.2 - POCT HgB A1C  2.  Essential (primary) hypertension Controlled.  3. Primary osteoarthritis of left knee Dr Marry Guan.  4. Current tobacco use   5. Edema of extremities   6. Morbid obesity with body mass index (BMI) of 40.0 or higher (HCC) Lifestyle stressed.More than 50% 25 minute visit spent in counseling or coordination of care      [name], MD  [facility]

## 2019-07-10 ENCOUNTER — Telehealth: Payer: Self-pay

## 2019-07-10 NOTE — Telephone Encounter (Signed)
Spoke with patient and informed her that she can have her A1C checked at her next visit. She gave verbal understanding

## 2019-07-10 NOTE — Telephone Encounter (Signed)
Copied from [place] [id number]. Topic: Appointment Scheduling - Scheduling Inquiry for Clinic >> Jul 10, 2019 11:45 AM Sheran Luz wrote: Patient would like to know if she can have AIC checked at upcoming 3 mo follow up. Please advise.

## 2019-07-12 ENCOUNTER — Ambulatory Visit: Payer: 59 | Admitting: Family Medicine

## 2019-07-12 ENCOUNTER — Other Ambulatory Visit: Payer: Self-pay

## 2019-07-12 VITALS — BP 164/92 | HR 84 | Temp 96.9°F | Wt 224.0 lb

## 2019-07-12 DIAGNOSIS — Z720 Tobacco use: Secondary | ICD-10-CM | POA: Diagnosis not present

## 2019-07-12 DIAGNOSIS — I10 Essential (primary) hypertension: Secondary | ICD-10-CM | POA: Diagnosis not present

## 2019-07-12 DIAGNOSIS — M1712 Unilateral primary osteoarthritis, left knee: Secondary | ICD-10-CM

## 2019-07-12 DIAGNOSIS — R600 Localized edema: Secondary | ICD-10-CM | POA: Diagnosis not present

## 2019-07-12 DIAGNOSIS — E119 Type 2 diabetes mellitus without complications: Secondary | ICD-10-CM | POA: Diagnosis not present

## 2019-07-12 LAB — POCT GLYCOSYLATED HEMOGLOBIN (HGB A1C): Hemoglobin A1C: 6.2 % — AB (ref 4.0–5.6)

## 2019-08-28 ENCOUNTER — Other Ambulatory Visit: Payer: Self-pay | Admitting: Family Medicine

## 2019-08-28 NOTE — Telephone Encounter (Signed)
Requested Prescriptions  Pending Prescriptions Disp Refills  . lisinopril (ZESTRIL) 40 MG tablet [Pharmacy Med Name: LISINOPRIL 40 MG TABS 40 Tablet] 60 tablet 5    Sig: TAKE 1 TABLET BY MOUTH DAILY.     Cardiovascular:  ACE Inhibitors Failed - 08/28/2019 12:12 PM      Failed - Cr in normal range and within 180 days    Creatinine  Date Value Ref Range Status  02/25/2013 0.96 0.60 - 1.30 mg/dL Final   Creatinine, Ser  Date Value Ref Range Status  01/10/2019 0.99 0.57 - 1.00 mg/dL Final         Failed - K in normal range and within 180 days    Potassium  Date Value Ref Range Status  01/10/2019 4.1 3.5 - 5.2 mmol/L Final  02/25/2013 3.5 3.5 - 5.1 mmol/L Final         Failed - Last BP in normal range    BP Readings from Last 1 Encounters:  07/12/19 (!) 164/92         Passed - Patient is not pregnant      Passed - Valid encounter within last 6 months    Recent Outpatient Visits          1 month ago Type 2 diabetes mellitus without complication, without long-term current use of insulin ([place])   [facility] Jerrol Banana., MD   3 months ago Need for influenza vaccination   [facility], Dionne Bucy, MD   5 months ago Encounter for annual physical exam   [facility], Dionne Bucy, MD   6 months ago Essential (primary) hypertension   [facility] Jerrol Banana., MD   8 months ago Essential (primary) hypertension   [facility] Jerrol Banana., MD      Future Appointments            In 2 months Jerrol Banana., MD [facility]           . metoprolol succinate (TOPROL-XL) 50 MG 24 hr tablet [Pharmacy Med Name: METOPROLOL SUCCINATE ER 50 50 Tablet] 60 tablet 5    Sig: TAKE 1 TABLET BY MOUTH ONCE DAILY. TAKE WITH OR IMMEDIATELY FOLLOWING A MEAL.     Cardiovascular:  Beta Blockers Failed - 08/28/2019 12:12 PM      Failed - Last BP in normal  range    BP Readings from Last 1 Encounters:  07/12/19 (!) 164/92         Passed - Last Heart Rate in normal range    Pulse Readings from Last 1 Encounters:  07/12/19 84         Passed - Valid encounter within last 6 months    Recent Outpatient Visits          1 month ago Type 2 diabetes mellitus without complication, without long-term current use of insulin [facility])   [facility] Jerrol Banana., MD   3 months ago Need for influenza vaccination   [facility] [place], Dionne Bucy, MD   5 months ago Encounter for annual physical exam   [facility], Dionne Bucy, MD   6 months ago Essential (primary) hypertension   [facility] Jerrol Banana., MD   8 months ago Essential (primary) hypertension   [facility] Jerrol Banana., MD      Future Appointments  In 2 months Jerrol Banana., MD [facility], [place]

## 2019-08-28 NOTE — Telephone Encounter (Signed)
Requested medication (s) are due for refill today: yes  Requested medication (s) are on the active medication list: yes  Last refill: 06/26/2019  Future visit scheduled: yes  Notes to clinic:  not delegated    Requested Prescriptions  Pending Prescriptions Disp Refills   LORazepam (ATIVAN) 0.5 MG tablet [Pharmacy Med Name: LORAZEPAM 0.5 MG TABS 0.5 Tablet] 60 tablet 1    Sig: TAKE 1 TABLET BY MOUTH TWICE DAILY AS NEEDED      Not Delegated - Psychiatry:  Anxiolytics/Hypnotics Failed - 08/28/2019 12:09 PM      Failed - This refill cannot be delegated      Failed - Urine Drug Screen completed in last 360 days.      Passed - Valid encounter within last 6 months    Recent Outpatient Visits           1 month ago Type 2 diabetes mellitus without complication, without long-term current use of insulin [facility])   [facility] Jerrol Banana., MD   3 months ago Need for influenza vaccination   [facility] [place], Dionne Bucy, MD   5 months ago Encounter for annual physical exam   [facility], Dionne Bucy, MD   6 months ago Essential (primary) hypertension   [facility] Jerrol Banana., MD   8 months ago Essential (primary) hypertension   [facility] Jerrol Banana., MD       Future Appointments             In 2 months Jerrol Banana., MD [facility] [place]

## 2019-08-28 NOTE — Telephone Encounter (Signed)
Please advise 

## 2019-09-21 DIAGNOSIS — M170 Bilateral primary osteoarthritis of knee: Secondary | ICD-10-CM | POA: Diagnosis not present

## 2019-09-21 DIAGNOSIS — Z96653 Presence of artificial knee joint, bilateral: Secondary | ICD-10-CM | POA: Diagnosis not present

## 2019-11-03 NOTE — Progress Notes (Deleted)
Established patient visit   Patient: Ruth Higgins   DOB: 15-Jan-1956   64 y.o. Female  MRN: [id number] Visit Date: 11/09/2019  Today's healthcare provider: [name], MD   No chief complaint on file.  Subjective    HPI Diabetes Mellitus Type II, follow-up  Lab Results  Component Value Date   HGBA1C 6.2 (A) 07/12/2019   HGBA1C 7.2 (H) 03/28/2019   HGBA1C 6.4 (H) 06/21/2018   Last seen for diabetes 4 months ago.  Management since then includes continuing the same treatment. She reports {excellent/good/fair/poor:19665} compliance with treatment. She {is/is not:21021397} having side effects. {document side effects if present:1}  Home blood sugar records: {diabetes glucometry results:16657}  Episodes of hypoglycemia? {Yes/No:20286} {enter details if yes:1}   Current insulin regiment: {***Type 'None' if not taking insulin                                                otherwise enter complete                                                 details of insulin regiment:1} Most Recent Eye Exam: ***  ----------------------------------------------------------------------------------------- Hypertension, follow-up  BP Readings from Last 3 Encounters:  07/12/19 (!) 164/92  03/30/19 (!) 159/86  02/28/19 122/72   She was last seen for hypertension 4 months ago.  BP at that visit was 164/92. Management since that visit includes; Controlled. She reports {excellent/good/fair/poor:19665} compliance with treatment. She {is/is not:9024} having side effects. {document side effects if present:1} She {is/is not:9024} exercising. She {is/is not:9024} adherent to low salt diet.   Outside blood pressures are {enter patient reported home BP, or 'not being checked':1}.  She {does/does not:200015} smoke.  Use of agents associated with hypertension: {bp agents assoc with hypertension:511::"none"}.    -----------------------------------------------------------------------------------------    {Show patient history (optional):23778::" "}   Medications: Outpatient Medications Prior to Visit  Medication Sig  . enoxaparin (LOVENOX) 40 MG/0.4ML injection Inject 0.4 mLs (40 mg total) into the skin daily for 14 days.  . ergocalciferol (VITAMIN D2) 1.25 MG (50000 UT) capsule Take 50,000 Units by mouth once a week.  Marland Kitchen lisinopril (ZESTRIL) 40 MG tablet TAKE 1 TABLET BY MOUTH DAILY.  Marland Kitchen LORazepam (ATIVAN) 0.5 MG tablet TAKE 1 TABLET BY MOUTH TWICE DAILY AS NEEDED  . metoprolol succinate (TOPROL-XL) 50 MG 24 hr tablet TAKE 1 TABLET BY MOUTH ONCE DAILY. TAKE WITH OR IMMEDIATELY FOLLOWING A MEAL.  . nicotine (NICODERM CQ - DOSED IN MG/24 HOURS) 21 mg/24hr patch Place 21 mg onto the skin daily as needed (smoke cessation).   . Pseudoephedrine-Ibuprofen 30-200 MG TABS Take 1 tablet by mouth every 4 (four) hours as needed.  Marland Kitchen spironolactone (ALDACTONE) 25 MG tablet Take 1 tablet (25 mg total) by mouth daily. (Patient not taking: Reported on 07/12/2019)  . traMADol (ULTRAM) 50 MG tablet Take 1-2 tablets (50-100 mg total) by mouth every 6 (six) hours as needed for moderate pain. (Patient not taking: Reported on 07/12/2019)   No facility-administered medications prior to visit.    Review of Systems  Constitutional: Negative for appetite change, chills, fatigue and fever.  Respiratory: Negative for chest tightness and shortness of breath.   Cardiovascular:  Negative for chest pain and palpitations.  Gastrointestinal: Negative for abdominal pain, nausea and vomiting.  Neurological: Negative for dizziness and weakness.    {Show previous labs (optional):23779::" "}  Objective    There were no vitals taken for this visit. {Show previous vital signs (optional):23777::" "}  Physical Exam  ***  No results found for any visits on 11/09/19.  Assessment & Plan     ***  No follow-ups on file.       {provider attestation***:1}   [name], MD  [facility] [phone] (phone) [phone] (fax)  [place]

## 2019-11-06 ENCOUNTER — Telehealth: Payer: Self-pay | Admitting: Family Medicine

## 2019-11-06 NOTE — Telephone Encounter (Signed)
Patient is calling to see if Dr. Rosanna Randy can place the orders for her lab work so that she can get it done in the morning. [phone]

## 2019-11-06 NOTE — Telephone Encounter (Signed)
Patient was calling to see if order for lab work had been placed. She will follow back up in the morning.

## 2019-11-07 ENCOUNTER — Other Ambulatory Visit: Payer: Self-pay

## 2019-11-07 DIAGNOSIS — E559 Vitamin D deficiency, unspecified: Secondary | ICD-10-CM

## 2019-11-07 DIAGNOSIS — R739 Hyperglycemia, unspecified: Secondary | ICD-10-CM

## 2019-11-07 DIAGNOSIS — E785 Hyperlipidemia, unspecified: Secondary | ICD-10-CM

## 2019-11-07 DIAGNOSIS — I10 Essential (primary) hypertension: Secondary | ICD-10-CM

## 2019-11-07 NOTE — Telephone Encounter (Signed)
Okay to order repeat labs that were ordered last September

## 2019-11-07 NOTE — Progress Notes (Signed)
vitam 

## 2019-11-07 NOTE — Telephone Encounter (Signed)
Waiting for a response from Dr. Rosanna Randy.

## 2019-11-07 NOTE — Telephone Encounter (Signed)
Lab orders placed and patient will come on Monday for her 4 month follow up.

## 2019-11-08 DIAGNOSIS — E785 Hyperlipidemia, unspecified: Secondary | ICD-10-CM | POA: Diagnosis not present

## 2019-11-08 DIAGNOSIS — I10 Essential (primary) hypertension: Secondary | ICD-10-CM | POA: Diagnosis not present

## 2019-11-08 DIAGNOSIS — R739 Hyperglycemia, unspecified: Secondary | ICD-10-CM | POA: Diagnosis not present

## 2019-11-08 DIAGNOSIS — E559 Vitamin D deficiency, unspecified: Secondary | ICD-10-CM | POA: Diagnosis not present

## 2019-11-09 ENCOUNTER — Ambulatory Visit: Payer: Self-pay | Admitting: Family Medicine

## 2019-11-09 LAB — HEMOGLOBIN A1C
Est. average glucose Bld gHb Est-mCnc: 134 mg/dL
Hgb A1c MFr Bld: 6.3 % — ABNORMAL HIGH (ref 4.8–5.6)

## 2019-11-09 LAB — LIPID PANEL
Chol/HDL Ratio: 5.6 ratio — ABNORMAL HIGH (ref 0.0–4.4)
Cholesterol, Total: 178 mg/dL (ref 100–199)
HDL: 32 mg/dL — ABNORMAL LOW (ref >39)
LDL Chol Calc (NIH): 123 mg/dL — ABNORMAL HIGH (ref 0–99)
Triglycerides: 128 mg/dL (ref 0–149)
VLDL Cholesterol Cal: 23 mg/dL (ref 5–40)

## 2019-11-09 LAB — VITAMIN D 25 HYDROXY (VIT D DEFICIENCY, FRACTURES): Vit D, 25-Hydroxy: 18.7 ng/mL — ABNORMAL LOW (ref 30.0–100.0)

## 2019-11-09 NOTE — Progress Notes (Deleted)
Established patient visit   Patient: Ruth Higgins   DOB: 1955/08/16   64 y.o. Female  MRN: [id number] Visit Date: 11/13/2019  Today's healthcare provider: [name], MD   No chief complaint on file.  Subjective    HPI   Diabetes Mellitus Type II, follow-up  Lab Results  Component Value Date   HGBA1C 6.3 (H) 11/08/2019   HGBA1C 6.2 (A) 07/12/2019   HGBA1C 7.2 (H) 03/28/2019   Last seen for diabetes 4 months ago.  Management since then includes continuing the same treatment. She reports {excellent/good/fair/poor:19665} compliance with treatment. She {is/is not:21021397} having side effects. {document side effects if present:1}  Home blood sugar records: {diabetes glucometry results:16657}  Episodes of hypoglycemia? {Yes/No:20286} {enter details if yes:1}   Current insulin regiment: {***Type 'None' if not taking insulin                                                otherwise enter complete                                                 details of insulin regiment:1} Most Recent Eye Exam: ***  ----------------------------------------------------------------------------------------- Hypertension, follow-up  BP Readings from Last 3 Encounters:  07/12/19 (!) 164/92  03/30/19 (!) 159/86  02/28/19 122/72   She was last seen for hypertension 4 months ago.  BP at that visit was 164/92. Management since that visit includes; continue medication. She reports {excellent/good/fair/poor:19665} compliance with treatment. She {is/is not:9024} having side effects. {document side effects if present:1} She {is/is not:9024} exercising. She {is/is not:9024} adherent to low salt diet.   Outside blood pressures are {enter patient reported home BP, or 'not being checked':1}.  She {does/does not:200015} smoke.  Use of agents associated with hypertension: {bp agents assoc with hypertension:511::"none"}.    -----------------------------------------------------------------------------------------    {Show patient history (optional):23778::" "}   Medications: Outpatient Medications Prior to Visit  Medication Sig  . enoxaparin (LOVENOX) 40 MG/0.4ML injection Inject 0.4 mLs (40 mg total) into the skin daily for 14 days.  . ergocalciferol (VITAMIN D2) 1.25 MG (50000 UT) capsule Take 50,000 Units by mouth once a week.  Marland Kitchen lisinopril (ZESTRIL) 40 MG tablet TAKE 1 TABLET BY MOUTH DAILY.  Marland Kitchen LORazepam (ATIVAN) 0.5 MG tablet TAKE 1 TABLET BY MOUTH TWICE DAILY AS NEEDED  . metoprolol succinate (TOPROL-XL) 50 MG 24 hr tablet TAKE 1 TABLET BY MOUTH ONCE DAILY. TAKE WITH OR IMMEDIATELY FOLLOWING A MEAL.  . nicotine (NICODERM CQ - DOSED IN MG/24 HOURS) 21 mg/24hr patch Place 21 mg onto the skin daily as needed (smoke cessation).   . Pseudoephedrine-Ibuprofen 30-200 MG TABS Take 1 tablet by mouth every 4 (four) hours as needed.  Marland Kitchen spironolactone (ALDACTONE) 25 MG tablet Take 1 tablet (25 mg total) by mouth daily. (Patient not taking: Reported on 07/12/2019)  . traMADol (ULTRAM) 50 MG tablet Take 1-2 tablets (50-100 mg total) by mouth every 6 (six) hours as needed for moderate pain. (Patient not taking: Reported on 07/12/2019)   No facility-administered medications prior to visit.    Review of Systems  Constitutional: Negative for appetite change, chills, fatigue and fever.  Respiratory: Negative for chest tightness and shortness of breath.  Cardiovascular: Negative for chest pain and palpitations.  Gastrointestinal: Negative for abdominal pain, nausea and vomiting.  Neurological: Negative for dizziness and weakness.    {Show previous labs (optional):23779::" "}  Objective    There were no vitals taken for this visit. {Show previous vital signs (optional):23777::" "}  Physical Exam  ***  No results found for any visits on 11/13/19.  Assessment & Plan     ***  No follow-ups on file.       {provider attestation***:1}   [name], MD  [facility] [phone] (phone) [phone] (fax)  [place]

## 2019-11-13 ENCOUNTER — Ambulatory Visit: Payer: Self-pay | Admitting: Family Medicine

## 2019-11-14 NOTE — Progress Notes (Signed)
Established patient visit  I,[name],acting as a scribe for [name], MD.,have documented all relevant documentation on the behalf of [name], MD,as directed by  [name], MD while in the presence of [name], MD.   Patient: Ruth Higgins   DOB: [birth year]   63 y.o. Female  MRN: [id number] Visit Date: 11/16/2019  Today's healthcare provider: [name], MD   Chief Complaint  Patient presents with  . Follow-up  . Diabetes  . Hypertension   Subjective    HPI  Patient states she is inactive due to OA pain. Diabetes Mellitus Type II, follow-up  Lab Results  Component Value Date   HGBA1C 6.3 (H) 11/08/2019   HGBA1C 6.2 (A) 07/12/2019   HGBA1C 7.2 (H) 03/28/2019   Last seen for diabetes 4 months ago.  Management since then includes continuing the same treatment. She reports good compliance with treatment. She is not having side effects. none  Home blood sugar records: fasting range: none  Episodes of hypoglycemia? No none  Current insulin regiment: n/a Most Recent Eye Exam:   -------------------------------------------------------------------- Hypertension, follow-up  BP Readings from Last 3 Encounters:  11/16/19 (!) 158/85  07/12/19 (!) 164/92  03/30/19 (!) 159/86   Wt Readings from Last 3 Encounters:  11/16/19 222 lb (100.7 kg)  07/12/19 224 lb (101.6 kg)  03/30/19 234 lb 9.6 oz (106.4 kg)     She was last seen for hypertension 4 months ago.  BP at that visit was 164/86. Management since that visit includes; controlled. She reports good compliance with treatment. She is not having side effects. none She is not exercising. She is adherent to low salt diet.   Outside blood pressures are not checking.  She does smoke.  Use of agents associated with hypertension: none.   --------------------------------------------------------------------   Past Medical History:  Diagnosis Date  . Arthritis    . Family history of adverse reaction to anesthesia    brother hard to wake up after surgery  . Hypertension   . PONV (postoperative nausea and vomiting) 1980s  . Pre-diabetes        Medications: Outpatient Medications Prior to Visit  Medication Sig  . lisinopril (ZESTRIL) 40 MG tablet TAKE 1 TABLET BY MOUTH DAILY.  Marland Kitchen LORazepam (ATIVAN) 0.5 MG tablet TAKE 1 TABLET BY MOUTH TWICE DAILY AS NEEDED  . metoprolol succinate (TOPROL-XL) 50 MG 24 hr tablet TAKE 1 TABLET BY MOUTH ONCE DAILY. TAKE WITH OR IMMEDIATELY FOLLOWING A MEAL.  . Pseudoephedrine-Ibuprofen 30-200 MG TABS Take 1 tablet by mouth every 4 (four) hours as needed.  Marland Kitchen spironolactone (ALDACTONE) 25 MG tablet Take 1 tablet (25 mg total) by mouth daily.  Marland Kitchen enoxaparin (LOVENOX) 40 MG/0.4ML injection Inject 0.4 mLs (40 mg total) into the skin daily for 14 days.  . ergocalciferol (VITAMIN D2) 1.25 MG (50000 UT) capsule Take 50,000 Units by mouth once a week.  . nicotine (NICODERM CQ - DOSED IN MG/24 HOURS) 21 mg/24hr patch Place 21 mg onto the skin daily as needed (smoke cessation).   . traMADol (ULTRAM) 50 MG tablet Take 1-2 tablets (50-100 mg total) by mouth every 6 (six) hours as needed for moderate pain. (Patient not taking: Reported on 11/16/2019)   No facility-administered medications prior to visit.    Review of Systems  Constitutional: Negative for appetite change, chills, fatigue and fever.  HENT: Negative.   Eyes: Negative.   Respiratory: Negative for chest tightness and shortness of  breath.   Cardiovascular: Negative for chest pain and palpitations.  Gastrointestinal: Negative for abdominal pain, nausea and vomiting.  Endocrine: Negative.   Musculoskeletal: Positive for arthralgias.  Allergic/Immunologic: Negative.   Neurological: Negative for dizziness and weakness.  Hematological: Negative.   Psychiatric/Behavioral: Negative.     Last hemoglobin A1c Lab Results  Component Value Date   HGBA1C 6.3 (H) 11/08/2019        Objective    BP (!) 158/85 (BP Location: Right Arm, Patient Position: Sitting, Cuff Size: Large)   Pulse 66   Temp (!) 96.9 F (36.1 C) (Other (Comment))   Resp 16   Ht 5\' 1"  (1.549 m)   Wt 222 lb (100.7 kg)   SpO2 98%   BMI 41.95 kg/m  BP Readings from Last 3 Encounters:  11/16/19 (!) 158/85  07/12/19 (!) 164/92  03/30/19 (!) 159/86   Wt Readings from Last 3 Encounters:  11/16/19 222 lb (100.7 kg)  07/12/19 224 lb (101.6 kg)  03/30/19 234 lb 9.6 oz (106.4 kg)      Physical Exam Vitals reviewed.  Constitutional:      Appearance: She is obese.  HENT:     Head: Normocephalic and atraumatic.     Right Ear: External ear normal.     Left Ear: External ear normal.     Nose: Nose normal.  Eyes:     General: No scleral icterus.    Conjunctiva/sclera: Conjunctivae normal.  Cardiovascular:     Rate and Rhythm: Normal rate and regular rhythm.     Heart sounds: Normal heart sounds.  Pulmonary:     Effort: Pulmonary effort is normal.     Breath sounds: Normal breath sounds.  Abdominal:     Palpations: Abdomen is soft.  Musculoskeletal:     Comments: Trace edema on the right.  1+ edema on the left.  Lymphadenopathy:     Cervical: No cervical adenopathy.  Skin:    General: Skin is warm and dry.  Neurological:     Mental Status: She is alert and oriented to person, place, and time. Mental status is at baseline.  Psychiatric:        Mood and Affect: Mood normal.        Behavior: Behavior normal.        Thought Content: Thought content normal.       No results found for any visits on 11/16/19.  Assessment & Plan     1. Type 2 diabetes mellitus without complication, without long-term current use of insulin (HCC) Control with A1c of 6.3.  Weight loss is recommended with diet and exercise.  She is child to make an eye appointment for later this year. I also think patient should be on a statin.  She wants to work on her habits. 2. Essential (primary)  hypertension Go to spironolactone daily which she has been taking as needed at her discretion.  Return to clinic 6 months.  3. Edema of extremities Take spironolactone 25 mg daily.  4.  Tobacco abuse Patient strongly encouraged to discontinue. 4.  Morbid obesity Weight loss stressed. Return in about 6 months (around 05/18/2020).      I, [name], MD, have reviewed all documentation for this visit. The documentation on 11/20/19 for the exam, diagnosis, procedures, and orders are all accurate and complete.    Dominik Lauricella Cranford Mon, MD  [facility] [phone] (phone) [phone] (fax)  [place]

## 2019-11-16 ENCOUNTER — Other Ambulatory Visit: Payer: Self-pay | Admitting: *Deleted

## 2019-11-16 ENCOUNTER — Other Ambulatory Visit: Payer: Self-pay

## 2019-11-16 ENCOUNTER — Encounter: Payer: Self-pay | Admitting: Family Medicine

## 2019-11-16 ENCOUNTER — Ambulatory Visit: Payer: 59 | Admitting: Family Medicine

## 2019-11-16 VITALS — BP 158/85 | HR 66 | Temp 96.9°F | Resp 16 | Ht 61.0 in | Wt 222.0 lb

## 2019-11-16 DIAGNOSIS — Z720 Tobacco use: Secondary | ICD-10-CM | POA: Diagnosis not present

## 2019-11-16 DIAGNOSIS — R600 Localized edema: Secondary | ICD-10-CM

## 2019-11-16 DIAGNOSIS — Z6841 Body Mass Index (BMI) 40.0 and over, adult: Secondary | ICD-10-CM | POA: Diagnosis not present

## 2019-11-16 DIAGNOSIS — E119 Type 2 diabetes mellitus without complications: Secondary | ICD-10-CM

## 2019-11-16 DIAGNOSIS — I10 Essential (primary) hypertension: Secondary | ICD-10-CM | POA: Diagnosis not present

## 2019-11-16 MED ORDER — METOPROLOL SUCCINATE ER 50 MG PO TB24: ORAL_TABLET | ORAL | 1 refills | Status: DC

## 2019-11-16 MED ORDER — SPIRONOLACTONE 25 MG PO TABS: 25.0000 mg | ORAL_TABLET | Freq: Every day | ORAL | 0 refills | Status: DC

## 2019-11-16 MED ORDER — FLUTICASONE PROPIONATE 50 MCG/ACT NA SUSP: 2.0000 | Freq: Every day | NASAL | 2 refills | Status: DC

## 2019-11-16 MED ORDER — LISINOPRIL 40 MG PO TABS: 40.0000 mg | ORAL_TABLET | Freq: Every day | ORAL | 1 refills | Status: DC

## 2019-11-16 NOTE — Patient Instructions (Addendum)
Take spironolactone 25 mg daily. Also try over-the-counter Lipoic Acid daily.

## 2019-11-21 MED ORDER — LORAZEPAM 0.5 MG PO TABS: 0.5000 mg | ORAL_TABLET | Freq: Two times a day (BID) | ORAL | 1 refills | Status: DC | PRN

## 2019-12-21 ENCOUNTER — Telehealth: Payer: Self-pay

## 2019-12-21 NOTE — Telephone Encounter (Signed)
Message left notifying patient that it is time to schedule the low dose lung cancer screening CT scan.  Instructed patient to return call to Shawn Perkins at [phone] to verify information prior to CT scan being scheduled.    

## 2020-01-09 ENCOUNTER — Telehealth: Payer: Self-pay | Admitting: *Deleted

## 2020-01-09 DIAGNOSIS — Z122 Encounter for screening for malignant neoplasm of respiratory organs: Secondary | ICD-10-CM

## 2020-01-09 DIAGNOSIS — Z87891 Personal history of nicotine dependence: Secondary | ICD-10-CM

## 2020-01-09 NOTE — Telephone Encounter (Signed)
Patient has been notified that annual lung cancer screening low dose CT scan is due currently or will be in near future. Confirmed that patient is within the age range of 55-77, and asymptomatic, (no signs or symptoms of lung cancer). Patient denies illness that would prevent curative treatment for lung cancer if found. Verified smoking history, (current, 50pack year). The shared decision making visit was done 12/01/16. Patient is agreeable for CT scan being scheduled.

## 2020-01-16 ENCOUNTER — Ambulatory Visit
Admission: RE | Admit: 2020-01-16 | Discharge: 2020-01-16 | Disposition: A | Discharge status: Home / Self Care | Payer: 59 | Source: Ambulatory Visit | Attending: Oncology | Admitting: Oncology

## 2020-01-16 ENCOUNTER — Other Ambulatory Visit: Payer: Self-pay

## 2020-01-16 DIAGNOSIS — Z87891 Personal history of nicotine dependence: Secondary | ICD-10-CM | POA: Insufficient documentation

## 2020-01-16 DIAGNOSIS — Z122 Encounter for screening for malignant neoplasm of respiratory organs: Secondary | ICD-10-CM | POA: Insufficient documentation

## 2020-01-16 DIAGNOSIS — F17210 Nicotine dependence, cigarettes, uncomplicated: Secondary | ICD-10-CM | POA: Diagnosis not present

## 2020-01-16 NOTE — Progress Notes (Signed)
Complete physical exam   Patient: Ruth Higgins   DOB: 1955/07/18   64 y.o. Female  MRN: [id number] Visit Date: 01/17/2020  Today's healthcare provider: Trinna Post, PA-C   Chief Complaint  Patient presents with  . Foot Pain   Subjective    Ruth Higgins is a 64 y.o. female who presents today for a complete physical exam.  She reports consuming a general diet.  She generally feels well. She reports sleeping well. She does have additional problems to discuss today.  Foot Injury  The incident occurred more than 1 week ago. The incident occurred at work. There was no injury mechanism. The pain is present in the left foot. Pertinent negatives include no inability to bear weight, numbness or tingling. She has tried elevation and NSAIDs for the symptoms. The treatment provided no relief.     The 10-year ASCVD risk score Mikey Bussing DC Brooke Bonito., et al., 2013) is: 38.7%   ---------------------------------------------------------------------------------------------------   Past Medical History:  Diagnosis Date  . Arthritis   . Family history of adverse reaction to anesthesia    brother hard to wake up after surgery  . Hypertension   . PONV (postoperative nausea and vomiting) 1980s  . Pre-diabetes    Past Surgical History:  Procedure Laterality Date  . ABDOMINAL HYSTERECTOMY  2014   Dr. Sabra Heck  . APPENDECTOMY    . HERNIA REPAIR  2014   Dr. Pat Patrick  . KNEE ARTHROPLASTY Right 01/31/2018   Procedure: COMPUTER ASSISTED TOTAL KNEE ARTHROPLASTY;  Surgeon: Dereck Leep, MD;  Location: [facility] ORS;  Service: Orthopedics;  Laterality: Right;  . KNEE ARTHROPLASTY Left 07/04/2018   Procedure: COMPUTER ASSISTED TOTAL KNEE ARTHROPLASTY;  Surgeon: Dereck Leep, MD;  Location: [facility] ORS;  Service: Orthopedics;  Laterality: Left;  . KNEE SURGERY Bilateral 2007  . LIPOMA EXCISION  1999   of the left side of the neck  . NECK SURGERY    . SALPINGOOPHORECTOMY Bilateral   . TONSILLECTOMY    .  TUBAL LIGATION     Social History   Socioeconomic History  . Marital status: Married    Spouse name: Not on file  . Number of children: Not on file  . Years of education: Not on file  . Highest education level: Not on file  Occupational History  . Not on file  Tobacco Use  . Smoking status: Current Every Day Smoker    Packs/day: 1.00    Years: 48.00    Pack years: 48.00    Types: Cigarettes  . Smokeless tobacco: Never Used  . Tobacco comment: smokes due to stress; has quit several times  Vaping Use  . Vaping Use: Never used  Substance and Sexual Activity  . Alcohol use: No    Alcohol/week: 0.0 standard drinks  . Drug use: No  . Sexual activity: Not on file  Other Topics Concern  . Not on file  Social History Narrative  . Not on file   Social Determinants of Health   Financial Resource Strain:   . Difficulty of Paying Living Expenses:   Food Insecurity:   . Worried About Charity fundraiser in the Last Year:   . Arboriculturist in the Last Year:   Transportation Needs:   . Film/video editor (Medical):   Marland Kitchen Lack of Transportation (Non-Medical):   Physical Activity:   . Days of Exercise per Week:   . Minutes of Exercise per Session:   Stress:   .  Feeling of Stress :   Social Connections:   . Frequency of Communication with Friends and Family:   . Frequency of Social Gatherings with Friends and Family:   . Attends Religious Services:   . Active Member of Clubs or Organizations:   . Attends Archivist Meetings:   Marland Kitchen Marital Status:   Intimate Partner Violence:   . Fear of Current or Ex-Partner:   . Emotionally Abused:   Marland Kitchen Physically Abused:   . Sexually Abused:    Family Status  Relation Name Status  . Father  Deceased  . Sister  Alive  . Brother  Alive  . Sister  Alive  . Mother  Alive  . Brother  (Not Specified)   Family History  Problem Relation Age of Onset  . Alzheimer's disease Father   . Heart disease Father   . Hypothyroidism  Sister   . Hypertension Brother   . Polycystic kidney disease Brother   . Depression Sister   . Hypertension Sister   . Anxiety disorder Sister   . Stroke Mother   . Rectal cancer Brother    Allergies  Allergen Reactions  . Ceclor [Cefaclor] Anaphylaxis and Hives  . Demerol [Meperidine] Nausea And Vomiting  . Erythromycin     EES caused jaundice like symptoms   . Flagyl [Metronidazole] Swelling  . Hydrochlorothiazide     severe cramping in her legs/knee/calves  . Hydrocodone-Acetaminophen Nausea And Vomiting  . Morphine And Related     Pt prefers to not be given morphine, tolerates hydromorphone instead   . Sulfa Antibiotics Swelling  . Penicillins Hives and Rash    Has patient had a PCN reaction causing immediate rash, facial/tongue/throat swelling, SOB or lightheadedness with hypotension: Yes Has patient had a PCN reaction causing severe rash involving mucus membranes or skin necrosis: Yes Has patient had a PCN reaction that required hospitalization: Yes Has patient had a PCN reaction occurring within the last 10 years: No If all of the above answers are "NO", then may proceed with Cephalosporin use.     Patient Care Team: Jerrol Banana., MD as PCP - General (Family Medicine) Jerrol Banana., MD (Family Medicine) Bary Castilla Forest Gleason, MD (General Surgery)   Medications: Outpatient Medications Prior to Visit  Medication Sig  . ergocalciferol (VITAMIN D2) 1.25 MG (50000 UT) capsule Take 50,000 Units by mouth once a week.  . fluticasone (FLONASE) 50 MCG/ACT nasal spray Place 2 sprays into both nostrils daily.  Marland Kitchen lisinopril (ZESTRIL) 40 MG tablet Take 1 tablet (40 mg total) by mouth daily.  Marland Kitchen LORazepam (ATIVAN) 0.5 MG tablet Take 1 tablet (0.5 mg total) by mouth 2 (two) times daily as needed.  . metoprolol succinate (TOPROL-XL) 50 MG 24 hr tablet TAKE 1 TABLET BY MOUTH ONCE DAILY. TAKE WITH OR IMMEDIATELY FOLLOWING A MEAL.  . Pseudoephedrine-Ibuprofen 30-200 MG  TABS Take 1 tablet by mouth every 4 (four) hours as needed.  Marland Kitchen spironolactone (ALDACTONE) 25 MG tablet Take 1 tablet (25 mg total) by mouth daily.  Marland Kitchen enoxaparin (LOVENOX) 40 MG/0.4ML injection Inject 0.4 mLs (40 mg total) into the skin daily for 14 days.  . nicotine (NICODERM CQ - DOSED IN MG/24 HOURS) 21 mg/24hr patch Place 21 mg onto the skin daily as needed (smoke cessation).   . traMADol (ULTRAM) 50 MG tablet Take 1-2 tablets (50-100 mg total) by mouth every 6 (six) hours as needed for moderate pain. (Patient not taking: Reported on 11/16/2019)   No  facility-administered medications prior to visit.    Review of Systems  Constitutional: Positive for activity change. Negative for appetite change, chills, diaphoresis, fatigue, fever and unexpected weight change.  HENT: Negative.   Eyes: Negative.   Respiratory: Negative.   Cardiovascular: Negative.   Gastrointestinal: Negative.   Endocrine: Negative.   Musculoskeletal: Positive for arthralgias. Negative for back pain, gait problem, joint swelling, myalgias, neck pain and neck stiffness.  Skin: Negative.   Allergic/Immunologic: Positive for environmental allergies. Negative for food allergies and immunocompromised state.  Neurological: Negative.  Negative for tingling and numbness.  Hematological: Negative.   Psychiatric/Behavioral: Negative.       Objective    BP (!) 160/84   Pulse 62   Temp (!) 96.9 F (36.1 C) (Temporal)   Ht 5\' 1"  (1.549 m)   Wt 224 lb (101.6 kg)   BMI 42.32 kg/m    Physical Exam Constitutional:      Appearance: Normal appearance.  HENT:     Head: Atraumatic.  Neck:     Vascular: No carotid bruit.  Cardiovascular:     Rate and Rhythm: Normal rate and regular rhythm.     Pulses: Normal pulses.     Heart sounds: Normal heart sounds.  Pulmonary:     Effort: Pulmonary effort is normal.     Breath sounds: Normal breath sounds.  Musculoskeletal:     Cervical back: No tenderness.     Right lower leg:  No edema.     Left lower leg: No edema.  Skin:    General: Skin is warm and dry.  Neurological:     Mental Status: She is alert and oriented to person, place, and time. Mental status is at baseline.  Psychiatric:        Mood and Affect: Mood normal.        Behavior: Behavior normal.        Thought Content: Thought content normal.        Judgment: Judgment normal.       Last depression screening scores PHQ 2/9 Scores 01/17/2020 03/30/2019 06/11/2016  PHQ - 2 Score 0 0 0  PHQ- 9 Score 0 5 -  Some encounter information is confidential and restricted. Go to Review Flowsheets activity to see all data.   Last fall risk screening Fall Risk  03/30/2019  Falls in the past year? 0  Number falls in past yr: 0  Injury with Fall? 0  Some encounter information is confidential and restricted. Go to Review Flowsheets activity to see all data.   Last Audit-C alcohol use screening Alcohol Use Disorder Test (AUDIT) 01/17/2020  1. How often do you have a drink containing alcohol? 0  2. How many drinks containing alcohol do you have on a typical day when you are drinking? 0  3. How often do you have six or more drinks on one occasion? 0  AUDIT-C Score 0  Alcohol Brief Interventions/Follow-up AUDIT Score <7 follow-up not indicated   A score of 3 or more in women, and 4 or more in men indicates increased risk for alcohol abuse, EXCEPT if all of the points are from question 1   No results found for any visits on 01/17/20.  Assessment & Plan    Routine Health Maintenance and Physical Exam  Exercise Activities and Dietary recommendations Goals   None     Immunization History  Administered Date(s) Administered  . Influenza,inj,Quad PF,6+ Mos 05/04/2014, 05/01/2019  . Influenza-Unspecified 05/05/2018  . Tdap 07/26/2013  Health Maintenance  Topic Date Due  . Hepatitis C Screening  Never done  . COVID-19 Vaccine (1) Never done  . HIV Screening  Never done  . MAMMOGRAM  01/16/2021  (Originally 05/24/2005)  . COLONOSCOPY  01/16/2021 (Originally 05/24/2005)  . INFLUENZA VACCINE  02/04/2020  . TETANUS/TDAP  07/27/2023    Discussed health benefits of physical activity, and encouraged her to engage in regular exercise appropriate for her age and condition.  Problem List Items Addressed This Visit      Cardiovascular and Mediastinum   Essential (primary) hypertension    Cholesterol elevated at last check.  Pt declines to start a cholesterol medication at this time.  Will follow up with PCP         Respiratory   Pulmonary emphysema ([place])    New finding on CT scan 01/16/2020. Pt states she is asymptomatic at this time.  Discussed in detailed the importance quitting smoking.           Other   Depression, major, single episode, mild (HCC)   HLD (hyperlipidemia)    Uncontrolled currently.  Pt declines stating a statin at this time.  Will follow up with Dr. Rosanna Randy.       Morbid obesity with body mass index (BMI) of 40.0 or higher ([place])    Discussed importance of healthy weight management Discussed diet and exercise       Current tobacco use    3-5 minute discussion regarding the harms of tobacco use, the benefits of cessation, and methods of cessation Discussed that there are medication options to help with cessation Patient is currently precontemplative  Will reassess at next visit       Hyperglycemia    Discussed the importance of lifestyle changes including diet and exercise.        Annual physical exam - Primary   Left foot pain   Relevant Orders   DG Foot Complete Left (Completed)   DG Ankle Complete Left (Completed)       No follow-ups on file.     ITrinna Post, PA-C, have reviewed all documentation for this visit. The documentation on 01/19/20 for the exam, diagnosis, procedures, and orders are all accurate and complete.    Paulene Floor  [facility] [phone] (phone) [phone]  (fax)  [place]

## 2020-01-17 ENCOUNTER — Encounter: Payer: Self-pay | Admitting: Physician Assistant

## 2020-01-17 ENCOUNTER — Ambulatory Visit
Admission: RE | Admit: 2020-01-17 | Discharge: 2020-01-17 | Disposition: A | Discharge status: Home / Self Care | Payer: 59 | Source: Ambulatory Visit | Attending: Physician Assistant | Admitting: Physician Assistant

## 2020-01-17 ENCOUNTER — Ambulatory Visit (INDEPENDENT_AMBULATORY_CARE_PROVIDER_SITE_OTHER): Payer: 59 | Admitting: Physician Assistant

## 2020-01-17 ENCOUNTER — Ambulatory Visit
Admission: RE | Admit: 2020-01-17 | Discharge: 2020-01-17 | Disposition: A | Discharge status: Home / Self Care | Payer: 59 | Attending: Physician Assistant | Admitting: Physician Assistant

## 2020-01-17 VITALS — BP 160/84 | HR 62 | Temp 96.9°F | Ht 61.0 in | Wt 224.0 lb

## 2020-01-17 DIAGNOSIS — Z720 Tobacco use: Secondary | ICD-10-CM | POA: Diagnosis not present

## 2020-01-17 DIAGNOSIS — F320 Major depressive disorder, single episode, mild: Secondary | ICD-10-CM

## 2020-01-17 DIAGNOSIS — J439 Emphysema, unspecified: Secondary | ICD-10-CM | POA: Diagnosis not present

## 2020-01-17 DIAGNOSIS — Z0000 Encounter for general adult medical examination without abnormal findings: Secondary | ICD-10-CM | POA: Diagnosis not present

## 2020-01-17 DIAGNOSIS — R739 Hyperglycemia, unspecified: Secondary | ICD-10-CM | POA: Diagnosis not present

## 2020-01-17 DIAGNOSIS — M7732 Calcaneal spur, left foot: Secondary | ICD-10-CM | POA: Diagnosis not present

## 2020-01-17 DIAGNOSIS — M7989 Other specified soft tissue disorders: Secondary | ICD-10-CM | POA: Diagnosis not present

## 2020-01-17 DIAGNOSIS — E119 Type 2 diabetes mellitus without complications: Secondary | ICD-10-CM | POA: Insufficient documentation

## 2020-01-17 DIAGNOSIS — M79672 Pain in left foot: Secondary | ICD-10-CM | POA: Insufficient documentation

## 2020-01-17 DIAGNOSIS — M19072 Primary osteoarthritis, left ankle and foot: Secondary | ICD-10-CM | POA: Diagnosis not present

## 2020-01-17 DIAGNOSIS — I10 Essential (primary) hypertension: Secondary | ICD-10-CM

## 2020-01-17 DIAGNOSIS — E785 Hyperlipidemia, unspecified: Secondary | ICD-10-CM

## 2020-01-17 NOTE — Assessment & Plan Note (Signed)
Cholesterol elevated at last check.  Pt declines to start a cholesterol medication at this time.  Will follow up with PCP

## 2020-01-17 NOTE — Assessment & Plan Note (Signed)
3-5 minute discussion regarding the harms of tobacco use, the benefits of cessation, and methods of cessation Discussed that there are medication options to help with cessation Patient is currently precontemplative  Will reassess at next visit  

## 2020-01-17 NOTE — Patient Instructions (Addendum)
Preventive Care 65-64 Years Old, Female Preventive care refers to visits with your health care provider and lifestyle choices that can promote health and wellness. This includes:  A yearly physical exam. This may also be called an annual well check.  Regular dental visits and eye exams.  Immunizations.  Screening for certain conditions.  Healthy lifestyle choices, such as eating a healthy diet, getting regular exercise, not using drugs or products that contain nicotine and tobacco, and limiting alcohol use. What can I expect for my preventive care visit? Physical exam Your health care provider will check your:  Height and weight. This may be used to calculate body mass index (BMI), which tells if you are at a healthy weight.  Heart rate and blood pressure.  Skin for abnormal spots. Counseling Your health care provider may ask you questions about your:  Alcohol, tobacco, and drug use.  Emotional well-being.  Home and relationship well-being.  Sexual activity.  Eating habits.  Work and work environment.  Method of birth control.  Menstrual cycle.  Pregnancy history. What immunizations do I need?  Influenza (flu) vaccine  This is recommended every year. Tetanus, diphtheria, and pertussis (Tdap) vaccine  You may need a Td booster every 10 years. Varicella (chickenpox) vaccine  You may need this if you have not been vaccinated. Zoster (shingles) vaccine  You may need this after age 65. Measles, mumps, and rubella (MMR) vaccine  You may need at least one dose of MMR if you were born in 1957 or later. You may also need a second dose. Pneumococcal conjugate (PCV13) vaccine  You may need this if you have certain conditions and were not previously vaccinated. Pneumococcal polysaccharide (PPSV23) vaccine  You may need one or two doses if you smoke cigarettes or if you have certain conditions. Meningococcal conjugate (MenACWY) vaccine  You may need this if you  have certain conditions. Hepatitis A vaccine  You may need this if you have certain conditions or if you travel or work in places where you may be exposed to hepatitis A. Hepatitis B vaccine  You may need this if you have certain conditions or if you travel or work in places where you may be exposed to hepatitis B. Haemophilus influenzae type b (Hib) vaccine  You may need this if you have certain conditions. Human papillomavirus (HPV) vaccine  If recommended by your health care provider, you may need three doses over 6 months. You may receive vaccines as individual doses or as more than one vaccine together in one shot (combination vaccines). Talk with your health care provider about the risks and benefits of combination vaccines. What tests do I need? Blood tests  Lipid and cholesterol levels. These may be checked every 5 years, or more frequently if you are over 50 years old.  Hepatitis C test.  Hepatitis B test. Screening  Lung cancer screening. You may have this screening every year starting at age 65 if you have a 30-pack-year history of smoking and currently smoke or have quit within the past 15 years.  Colorectal cancer screening. All adults should have this screening starting at age 65 and continuing until age 75. Your health care provider may recommend screening at age 45 if you are at increased risk. You will have tests every 1-10 years, depending on your results and the type of screening test.  Diabetes screening. This is done by checking your blood sugar (glucose) after you have not eaten for a while (fasting). You may have this   done every 1-3 years.  Mammogram. This may be done every 1-2 years. Talk with your health care provider about when you should start having regular mammograms. This may depend on whether you have a family history of breast cancer.  BRCA-related cancer screening. This may be done if you have a family history of breast, ovarian, tubal, or peritoneal  cancers.  Pelvic exam and Pap test. This may be done every 3 years starting at age 65. Starting at age 65, this may be done every 5 years if you have a Pap test in combination with an HPV test. Other tests  Sexually transmitted disease (STD) testing.  Bone density scan. This is done to screen for osteoporosis. You may have this scan if you are at high risk for osteoporosis. Follow these instructions at home: Eating and drinking  Eat a diet that includes fresh fruits and vegetables, whole grains, lean protein, and low-fat dairy.  Take vitamin and mineral supplements as recommended by your health care provider.  Do not drink alcohol if: ? Your health care provider tells you not to drink. ? You are pregnant, may be pregnant, or are planning to become pregnant.  If you drink alcohol: ? Limit how much you have to 0-1 drink a day. ? Be aware of how much alcohol is in your drink. In the [place] one drink equals one 12 oz bottle of beer (355 mL), one 5 oz glass of wine (148 mL), or one 1 oz glass of hard liquor (44 mL). Lifestyle  Take daily care of your teeth and gums.  Stay active. Exercise for at least 30 minutes on 5 or more days each week.  Do not use any products that contain nicotine or tobacco, such as cigarettes, e-cigarettes, and chewing tobacco. If you need help quitting, ask your health care provider.  If you are sexually active, practice safe sex. Use a condom or other form of birth control (contraception) in order to prevent pregnancy and STIs (sexually transmitted infections).  If told by your health care provider, take low-dose aspirin daily starting at age 65. What's next?  Visit your health care provider once a year for a well check visit.  Ask your health care provider how often you should have your eyes and teeth checked.  Stay up to date on all vaccines. This information is not intended to replace advice given to you by your health care provider. Make sure you  discuss any questions you have with your health care provider. Document Revised: 03/03/2018 Document Reviewed: 03/03/2018 Elsevier Patient Education  [employer].  Chronic Obstructive Pulmonary Disease Chronic obstructive pulmonary disease (COPD) is a long-term (chronic) lung problem. When you have COPD, it is hard for air to get in and out of your lungs. Usually the condition gets worse over time, and your lungs will never return to normal. There are things you can do to keep yourself as healthy as possible.  Your doctor may treat your condition with: ? Medicines. ? Oxygen. ? Lung surgery.  Your doctor may also recommend: ? Rehabilitation. This includes steps to make your body work better. It may involve a team of specialists. ? Quitting smoking, if you smoke. ? Exercise and changes to your diet. ? Comfort measures (palliative care). Follow these instructions at home: Medicines  Take over-the-counter and prescription medicines only as told by your doctor.  Talk to your doctor before taking any cough or allergy medicines. You may need to avoid medicines that cause your lungs  to be dry. Lifestyle  If you smoke, stop. Smoking makes the problem worse. If you need help quitting, ask your doctor.  Avoid being around things that make your breathing worse. This may include smoke, chemicals, and fumes.  Stay active, but remember to rest as well.  Learn and use tips on how to relax.  Make sure you get enough sleep. Most adults need at least 7 hours of sleep every night.  Eat healthy foods. Eat smaller meals more often. Rest before meals. Controlled breathing Learn and use tips on how to control your breathing as told by your doctor. Try:  Breathing in (inhaling) through your nose for 1 second. Then, pucker your lips and breath out (exhale) through your lips for 2 seconds.  Putting one hand on your belly (abdomen). Breathe in slowly through your nose for 1 second. Your hand on  your belly should move out. Pucker your lips and breathe out slowly through your lips. Your hand on your belly should move in as you breathe out.  Controlled coughing Learn and use controlled coughing to clear mucus from your lungs. Follow these steps: 1. Lean your head a little forward. 2. Breathe in deeply. 3. Try to hold your breath for 3 seconds. 4. Keep your mouth slightly open while coughing 2 times. 5. Spit any mucus out into a tissue. 6. Rest and do the steps again 1 or 2 times as needed. General instructions  Make sure you get all the shots (vaccines) that your doctor recommends. Ask your doctor about a flu shot and a pneumonia shot.  Use oxygen therapy and pulmonary rehabilitation if told by your doctor. If you need home oxygen therapy, ask your doctor if you should buy a tool to measure your oxygen level (oximeter).  Make a COPD action plan with your doctor. This helps you to know what to do if you feel worse than usual.  Manage any other conditions you have as told by your doctor.  Avoid going outside when it is very hot, cold, or humid.  Avoid people who have a sickness you can catch (contagious).  Keep all follow-up visits as told by your doctor. This is important. Contact a doctor if:  You cough up more mucus than usual.  There is a change in the color or thickness of the mucus.  It is harder to breathe than usual.  Your breathing is faster than usual.  You have trouble sleeping.  You need to use your medicines more often than usual.  You have trouble doing your normal activities such as getting dressed or walking around the house. Get help right away if:  You have shortness of breath while resting.  You have shortness of breath that stops you from: ? Being able to talk. ? Doing normal activities.  Your chest hurts for longer than 5 minutes.  Your skin color is more blue than usual.  Your pulse oximeter shows that you have low oxygen for longer than  5 minutes.  You have a fever.  You feel too tired to breathe normally. Summary  Chronic obstructive pulmonary disease (COPD) is a long-term lung problem.  The way your lungs work will never return to normal. Usually the condition gets worse over time. There are things you can do to keep yourself as healthy as possible.  Take over-the-counter and prescription medicines only as told by your doctor.  If you smoke, stop. Smoking makes the problem worse. This information is not intended to replace advice  given to you by your health care provider. Make sure you discuss any questions you have with your health care provider. Document Revised: 06/04/2017 Document Reviewed: 07/27/2016 Elsevier Patient Education  2020 [employer].

## 2020-01-18 NOTE — Assessment & Plan Note (Signed)
Discussed the importance of lifestyle changes including diet and exercise.

## 2020-01-18 NOTE — Assessment & Plan Note (Signed)
Uncontrolled currently.  Pt declines stating a statin at this time.  Will follow up with Dr. Rosanna Randy.

## 2020-01-18 NOTE — Assessment & Plan Note (Signed)
New finding on CT scan 01/16/2020. Pt states she is asymptomatic at this time.  Discussed in detailed the importance quitting smoking.

## 2020-01-18 NOTE — Assessment & Plan Note (Signed)
Discussed importance of healthy weight management Discussed diet and exercise  

## 2020-01-19 ENCOUNTER — Encounter: Payer: Self-pay | Admitting: *Deleted

## 2020-01-19 ENCOUNTER — Encounter: Payer: 59 | Admitting: Family Medicine

## 2020-01-19 ENCOUNTER — Telehealth: Payer: Self-pay | Admitting: Family Medicine

## 2020-01-19 ENCOUNTER — Encounter: Payer: Self-pay | Admitting: Physician Assistant

## 2020-01-19 NOTE — Telephone Encounter (Signed)
Patient requesting call back from clinical staff to discuss latest ankle and foot x-ray. Assisted patient with setting up mychart account to view released result but patient would still like to speak with someone in office .

## 2020-01-19 NOTE — Telephone Encounter (Signed)
Advised patient of xray results.

## 2020-02-01 DIAGNOSIS — M76822 Posterior tibial tendinitis, left leg: Secondary | ICD-10-CM | POA: Diagnosis not present

## 2020-02-01 DIAGNOSIS — M19072 Primary osteoarthritis, left ankle and foot: Secondary | ICD-10-CM | POA: Diagnosis not present

## 2020-02-01 DIAGNOSIS — M79672 Pain in left foot: Secondary | ICD-10-CM | POA: Diagnosis not present

## 2020-02-29 DIAGNOSIS — M79672 Pain in left foot: Secondary | ICD-10-CM | POA: Diagnosis not present

## 2020-04-02 DIAGNOSIS — M19072 Primary osteoarthritis, left ankle and foot: Secondary | ICD-10-CM | POA: Diagnosis not present

## 2020-04-02 DIAGNOSIS — M76822 Posterior tibial tendinitis, left leg: Secondary | ICD-10-CM | POA: Diagnosis not present

## 2020-04-02 DIAGNOSIS — R600 Localized edema: Secondary | ICD-10-CM | POA: Diagnosis not present

## 2020-04-03 ENCOUNTER — Encounter: Payer: 59 | Admitting: Family Medicine

## 2020-04-04 ENCOUNTER — Other Ambulatory Visit (HOSPITAL_COMMUNITY): Payer: Self-pay | Admitting: Podiatry

## 2020-04-04 ENCOUNTER — Other Ambulatory Visit: Payer: Self-pay | Admitting: Podiatry

## 2020-04-04 DIAGNOSIS — R600 Localized edema: Secondary | ICD-10-CM

## 2020-04-04 DIAGNOSIS — M19072 Primary osteoarthritis, left ankle and foot: Secondary | ICD-10-CM

## 2020-04-04 DIAGNOSIS — M76822 Posterior tibial tendinitis, left leg: Secondary | ICD-10-CM

## 2020-04-05 ENCOUNTER — Other Ambulatory Visit: Payer: Self-pay | Admitting: Podiatry

## 2020-04-11 ENCOUNTER — Ambulatory Visit: Payer: 59

## 2020-04-11 ENCOUNTER — Other Ambulatory Visit: Payer: Self-pay

## 2020-04-11 ENCOUNTER — Ambulatory Visit
Admission: RE | Admit: 2020-04-11 | Discharge: 2020-04-11 | Disposition: A | Discharge status: Home / Self Care | Payer: 59 | Source: Ambulatory Visit | Attending: Podiatry | Admitting: Podiatry

## 2020-04-11 DIAGNOSIS — M7989 Other specified soft tissue disorders: Secondary | ICD-10-CM | POA: Diagnosis not present

## 2020-04-11 DIAGNOSIS — M76822 Posterior tibial tendinitis, left leg: Secondary | ICD-10-CM | POA: Diagnosis not present

## 2020-04-11 DIAGNOSIS — R600 Localized edema: Secondary | ICD-10-CM | POA: Insufficient documentation

## 2020-04-11 DIAGNOSIS — M19072 Primary osteoarthritis, left ankle and foot: Secondary | ICD-10-CM | POA: Insufficient documentation

## 2020-04-11 DIAGNOSIS — M79662 Pain in left lower leg: Secondary | ICD-10-CM | POA: Diagnosis not present

## 2020-04-15 ENCOUNTER — Ambulatory Visit: Payer: 59

## 2020-04-17 ENCOUNTER — Other Ambulatory Visit: Payer: Self-pay | Admitting: Podiatry

## 2020-04-17 DIAGNOSIS — M76822 Posterior tibial tendinitis, left leg: Secondary | ICD-10-CM

## 2020-04-19 NOTE — Progress Notes (Signed)
     Established patient visit   Patient: Ruth Higgins   DOB: Sep 16, 1955   64 y.o. Female  MRN: [id number] Visit Date: 04/22/2020  Today's healthcare provider: [name], MD   No chief complaint on file.  Subjective    HPI       Medications: Outpatient Medications Prior to Visit  Medication Sig  . enoxaparin (LOVENOX) 40 MG/0.4ML injection Inject 0.4 mLs (40 mg total) into the skin daily for 14 days.  . ergocalciferol (VITAMIN D2) 1.25 MG (50000 UT) capsule Take 50,000 Units by mouth once a week.  . fluticasone (FLONASE) 50 MCG/ACT nasal spray Place 2 sprays into both nostrils daily.  Marland Kitchen lisinopril (ZESTRIL) 40 MG tablet Take 1 tablet (40 mg total) by mouth daily.  Marland Kitchen LORazepam (ATIVAN) 0.5 MG tablet Take 1 tablet (0.5 mg total) by mouth 2 (two) times daily as needed.  . metoprolol succinate (TOPROL-XL) 50 MG 24 hr tablet TAKE 1 TABLET BY MOUTH ONCE DAILY. TAKE WITH OR IMMEDIATELY FOLLOWING A MEAL.  . nicotine (NICODERM CQ - DOSED IN MG/24 HOURS) 21 mg/24hr patch Place 21 mg onto the skin daily as needed (smoke cessation).   . Pseudoephedrine-Ibuprofen 30-200 MG TABS Take 1 tablet by mouth every 4 (four) hours as needed.  Marland Kitchen spironolactone (ALDACTONE) 25 MG tablet Take 1 tablet (25 mg total) by mouth daily.  . traMADol (ULTRAM) 50 MG tablet Take 1-2 tablets (50-100 mg total) by mouth every 6 (six) hours as needed for moderate pain. (Patient not taking: Reported on 11/16/2019)   No facility-administered medications prior to visit.    Review of Systems  Constitutional: Negative for appetite change, chills, fatigue and fever.  Respiratory: Negative for chest tightness and shortness of breath.   Cardiovascular: Negative for chest pain and palpitations.  Gastrointestinal: Negative for abdominal pain, nausea and vomiting.  Neurological: Negative for dizziness and weakness.       Objective    There were no vitals taken for this visit.    Physical Exam     No  results found for any visits on 04/22/20.  Assessment & Plan      1. Need for immunization against influenza  - Flu Vaccine QUAD 36+ mos IM   No follow-ups on file.          Richard Cranford Mon, MD  [facility] [phone] (phone) [phone] (fax)  [place]

## 2020-04-22 ENCOUNTER — Other Ambulatory Visit: Payer: Self-pay

## 2020-04-22 ENCOUNTER — Ambulatory Visit (INDEPENDENT_AMBULATORY_CARE_PROVIDER_SITE_OTHER): Payer: 59 | Admitting: Family Medicine

## 2020-04-22 DIAGNOSIS — Z23 Encounter for immunization: Secondary | ICD-10-CM | POA: Diagnosis not present

## 2020-05-01 ENCOUNTER — Other Ambulatory Visit: Payer: Self-pay

## 2020-05-01 ENCOUNTER — Ambulatory Visit
Admission: RE | Admit: 2020-05-01 | Discharge: 2020-05-01 | Disposition: A | Discharge status: Home / Self Care | Payer: 59 | Source: Ambulatory Visit | Attending: Podiatry | Admitting: Podiatry

## 2020-05-01 DIAGNOSIS — M76822 Posterior tibial tendinitis, left leg: Secondary | ICD-10-CM | POA: Diagnosis not present

## 2020-05-01 DIAGNOSIS — R600 Localized edema: Secondary | ICD-10-CM | POA: Diagnosis not present

## 2020-05-01 DIAGNOSIS — M7989 Other specified soft tissue disorders: Secondary | ICD-10-CM | POA: Diagnosis not present

## 2020-05-01 DIAGNOSIS — M65872 Other synovitis and tenosynovitis, left ankle and foot: Secondary | ICD-10-CM | POA: Diagnosis not present

## 2020-05-02 DIAGNOSIS — Z96653 Presence of artificial knee joint, bilateral: Secondary | ICD-10-CM | POA: Diagnosis not present

## 2020-05-07 ENCOUNTER — Other Ambulatory Visit
Admission: RE | Admit: 2020-05-07 | Discharge: 2020-05-07 | Disposition: A | Discharge status: Home / Self Care | Payer: 59 | Attending: Podiatry | Admitting: Podiatry

## 2020-05-07 ENCOUNTER — Other Ambulatory Visit: Payer: Self-pay | Admitting: Podiatry

## 2020-05-07 ENCOUNTER — Other Ambulatory Visit: Payer: Self-pay

## 2020-05-07 DIAGNOSIS — M19072 Primary osteoarthritis, left ankle and foot: Secondary | ICD-10-CM | POA: Diagnosis not present

## 2020-05-07 DIAGNOSIS — R600 Localized edema: Secondary | ICD-10-CM | POA: Diagnosis not present

## 2020-05-07 DIAGNOSIS — M65879 Other synovitis and tenosynovitis, unspecified ankle and foot: Secondary | ICD-10-CM | POA: Diagnosis not present

## 2020-05-07 DIAGNOSIS — M76822 Posterior tibial tendinitis, left leg: Secondary | ICD-10-CM | POA: Diagnosis not present

## 2020-05-07 DIAGNOSIS — M79669 Pain in unspecified lower leg: Secondary | ICD-10-CM | POA: Insufficient documentation

## 2020-05-07 LAB — URIC ACID: Uric Acid, Serum: 6.8 mg/dL (ref 2.5–7.1)

## 2020-05-08 LAB — C-REACTIVE PROTEIN: CRP: 5.5 mg/dL — ABNORMAL HIGH (ref <1.0)

## 2020-05-08 LAB — ANTISTREPTOLYSIN O TITER: ASO: 152 IU/mL (ref 0.0–200.0)

## 2020-05-08 LAB — ANA: Anti Nuclear Antibody (ANA): NEGATIVE

## 2020-05-08 LAB — RHEUMATOID FACTOR: Rheumatoid fact SerPl-aCnc: <10 IU/mL (ref 0.0–13.9)

## 2020-05-14 ENCOUNTER — Other Ambulatory Visit: Payer: Self-pay | Admitting: Rheumatology

## 2020-05-14 DIAGNOSIS — M19079 Primary osteoarthritis, unspecified ankle and foot: Secondary | ICD-10-CM | POA: Diagnosis not present

## 2020-05-14 DIAGNOSIS — M1990 Unspecified osteoarthritis, unspecified site: Secondary | ICD-10-CM | POA: Diagnosis not present

## 2020-05-14 DIAGNOSIS — R609 Edema, unspecified: Secondary | ICD-10-CM | POA: Diagnosis not present

## 2020-05-21 ENCOUNTER — Other Ambulatory Visit: Payer: Self-pay

## 2020-05-21 ENCOUNTER — Other Ambulatory Visit: Payer: Self-pay | Admitting: Family Medicine

## 2020-05-21 ENCOUNTER — Encounter: Payer: Self-pay | Admitting: Family Medicine

## 2020-05-21 ENCOUNTER — Ambulatory Visit (INDEPENDENT_AMBULATORY_CARE_PROVIDER_SITE_OTHER): Payer: 59 | Admitting: Family Medicine

## 2020-05-21 VITALS — BP 146/72 | HR 69 | Resp 18 | Ht 61.0 in

## 2020-05-21 DIAGNOSIS — I10 Essential (primary) hypertension: Secondary | ICD-10-CM

## 2020-05-21 DIAGNOSIS — M1712 Unilateral primary osteoarthritis, left knee: Secondary | ICD-10-CM

## 2020-05-21 DIAGNOSIS — M79672 Pain in left foot: Secondary | ICD-10-CM | POA: Diagnosis not present

## 2020-05-21 DIAGNOSIS — E785 Hyperlipidemia, unspecified: Secondary | ICD-10-CM

## 2020-05-21 DIAGNOSIS — Z6841 Body Mass Index (BMI) 40.0 and over, adult: Secondary | ICD-10-CM

## 2020-05-21 DIAGNOSIS — E119 Type 2 diabetes mellitus without complications: Secondary | ICD-10-CM | POA: Diagnosis not present

## 2020-05-21 NOTE — Progress Notes (Signed)
I,Ruth Higgins,acting as a scribe for Ruth Durie, MD.,have documented all relevant documentation on the behalf of Ruth Durie, MD,as directed by  Ruth Durie, MD while in the presence of Ruth Durie, MD.   Established patient visit   Patient: Ruth Higgins   DOB: 1956/05/12   64 y.o. Female  MRN: [id number] Visit Date: 05/21/2020  Today's healthcare provider: [name], MD   Chief Complaint  Patient presents with  . Diabetes  . Follow-up  . Hypertension   Subjective    HPI  Patient has had chronic problems with her left ankle recently and is seeing podiatry and rheumatology.  She has been on prednisone twice she has stopped Advil sinus. Diabetes Mellitus Type II, follow-up  Lab Results  Component Value Date   HGBA1C 6.3 (H) 11/08/2019   HGBA1C 6.2 (A) 07/12/2019   HGBA1C 7.2 (H) 03/28/2019   Last seen for diabetes 6 months ago.  Management since then includes; Discussed the importance of lifestyle changes including diet and exercise.  She reports good compliance with treatment. She is not having side effects. none  Home blood sugar records: fasting range: none  Episodes of hypoglycemia? No none   Current insulin regiment: n/a Most Recent Eye Exam: none  --------------------------------------------------------------------  Hypertension, follow-up  BP Readings from Last 3 Encounters:  05/21/20 (!) 146/72  01/17/20 (!) 160/84  11/16/19 (!) 158/85   Wt Readings from Last 3 Encounters:  01/17/20 224 lb (101.6 kg)  01/16/20 210 lb (95.3 kg)  11/16/19 222 lb (100.7 kg)     She was last seen for hypertension 4 months ago.  BP at that visit was 160/84. Management since that visit includes; on lisinopril and metoprolol. Started spironolactone daily on 11/16/2019 visit. She reports good compliance with treatment. She is not having side effects. none She is not exercising. She is not adherent to low salt diet.   Outside  blood pressures are not checking.  She does not smoke.  Use of agents associated with hypertension: none.   --------------------------------------------------------------------  Edema of extremities From 01/17/2020-Take spironolactone 25 mg daily.        Medications: Outpatient Medications Prior to Visit  Medication Sig  . ergocalciferol (VITAMIN D2) 1.25 MG (50000 UT) capsule Take 50,000 Units by mouth once a week.  . fluticasone (FLONASE) 50 MCG/ACT nasal spray Place 2 sprays into both nostrils daily.  Marland Kitchen lisinopril (ZESTRIL) 40 MG tablet Take 1 tablet (40 mg total) by mouth daily.  Marland Kitchen LORazepam (ATIVAN) 0.5 MG tablet Take 1 tablet (0.5 mg total) by mouth 2 (two) times daily as needed.  . metoprolol succinate (TOPROL-XL) 50 MG 24 hr tablet TAKE 1 TABLET BY MOUTH ONCE DAILY. TAKE WITH OR IMMEDIATELY FOLLOWING A MEAL.  . Pseudoephedrine-Ibuprofen 30-200 MG TABS Take 1 tablet by mouth every 4 (four) hours as needed.  Marland Kitchen spironolactone (ALDACTONE) 25 MG tablet Take 1 tablet (25 mg total) by mouth daily.  Marland Kitchen enoxaparin (LOVENOX) 40 MG/0.4ML injection Inject 0.4 mLs (40 mg total) into the skin daily for 14 days.  . nicotine (NICODERM CQ - DOSED IN MG/24 HOURS) 21 mg/24hr patch Place 21 mg onto the skin daily as needed (smoke cessation).  (Patient not taking: Reported on 05/21/2020)  . traMADol (ULTRAM) 50 MG tablet Take 1-2 tablets (50-100 mg total) by mouth every 6 (six) hours as needed for moderate pain. (Patient not taking: Reported on 11/16/2019)   No facility-administered medications prior to visit.  Review of Systems  Constitutional: Negative for appetite change, chills, fatigue and fever.  Respiratory: Negative for chest tightness and shortness of breath.   Cardiovascular: Negative for chest pain and palpitations.  Gastrointestinal: Negative for abdominal pain, nausea and vomiting.  Neurological: Negative for dizziness and weakness.       Objective    BP (!) 146/72 (BP  Location: Left Arm, Patient Position: Sitting, Cuff Size: Large)   Pulse 69   Resp 18   Ht 5\' 1"  (1.549 m)   SpO2 98%   BMI 42.32 kg/m  BP Readings from Last 3 Encounters:  05/21/20 (!) 146/72  01/17/20 (!) 160/84  11/16/19 (!) 158/85   Wt Readings from Last 3 Encounters:  01/17/20 224 lb (101.6 kg)  01/16/20 210 lb (95.3 kg)  11/16/19 222 lb (100.7 kg)      Physical Exam Vitals reviewed.  Constitutional:      Appearance: She is obese.  HENT:     Head: Normocephalic and atraumatic.     Right Ear: External ear normal.     Left Ear: External ear normal.     Nose: Nose normal.  Eyes:     General: No scleral icterus.    Conjunctiva/sclera: Conjunctivae normal.  Cardiovascular:     Rate and Rhythm: Normal rate and regular rhythm.     Heart sounds: Normal heart sounds.  Pulmonary:     Effort: Pulmonary effort is normal.     Breath sounds: Normal breath sounds.  Abdominal:     Palpations: Abdomen is soft.  Musculoskeletal:     Comments: Trace edema on the right.  1+ edema on the left. There is mild diffuse pain swelling and warmth of the left ankle.  No site of any obvious infection.  Lymphadenopathy:     Cervical: No cervical adenopathy.  Skin:    General: Skin is warm and dry.  Neurological:     Mental Status: She is alert and oriented to person, place, and time. Mental status is at baseline.  Psychiatric:        Mood and Affect: Mood normal.        Behavior: Behavior normal.        Thought Content: Thought content normal.       No results found for any visits on 05/21/20.  Assessment & Plan     1. Type 2 diabetes mellitus without complication, without long-term current use of insulin (HCC)  - HgB A1c  2. Class 3 severe obesity with serious comorbidity and body mass index (BMI) of 40.0 to 44.9 in adult, unspecified obesity type ([place]) Diet and exercise again stressed for overall health  3. Left foot pain Wearing boot.  Work-up per podiatry and  rheumatology. - CBC w/Diff/Platelet  4. Primary osteoarthritis of left knee   5. Essential (primary) hypertension  - Renal function panel  6. Hyperlipidemia, unspecified hyperlipidemia type      Return in about 6 months (around 11/18/2020).         Ferrell Flam Cranford Mon, MD  [facility] [phone] (phone) [phone] (fax)  [place]

## 2020-05-21 NOTE — Telephone Encounter (Signed)
Medication Refill - Medication: Lisinopril and Lorazepam and Metoprolol  Has the patient contacted their pharmacy? No. (Agent: If no, request that the patient contact the pharmacy for the refill.) (Agent: If yes, when and what did the pharmacy advise?)  Preferred Pharmacy (with phone number or street name): [place], [place]: Please be advised that RX refills may take up to 3 business days. We ask that you follow-up with your pharmacy.

## 2020-05-22 ENCOUNTER — Other Ambulatory Visit: Payer: Self-pay | Admitting: Family Medicine

## 2020-05-22 DIAGNOSIS — I10 Essential (primary) hypertension: Secondary | ICD-10-CM

## 2020-05-22 MED ORDER — METOPROLOL SUCCINATE ER 50 MG PO TB24: ORAL_TABLET | ORAL | 0 refills | Status: DC

## 2020-05-22 MED ORDER — LISINOPRIL 40 MG PO TABS: 40.0000 mg | ORAL_TABLET | Freq: Every day | ORAL | 0 refills | Status: DC

## 2020-05-22 MED ORDER — LORAZEPAM 0.5 MG PO TABS: 0.5000 mg | ORAL_TABLET | Freq: Two times a day (BID) | ORAL | 1 refills | Status: DC | PRN

## 2020-05-22 NOTE — Telephone Encounter (Signed)
Requested medication (s) are due for refill today: no  Requested medication (s) are on the active medication list: yes   Last refill:  02/19/2020  Future visit scheduled: yes   Notes to clinic:  this refill cannot be delegated    Requested Prescriptions  Pending Prescriptions Disp Refills   LORazepam (ATIVAN) 0.5 MG tablet [Pharmacy Med Name: LORAZEPAM 0.5 MG TABS 0.5 Tablet] 60 tablet 1    Sig: TAKE 1 TABLET (0.5 MG TOTAL) BY MOUTH 2 (TWO) TIMES DAILY AS NEEDED.      Not Delegated - Psychiatry:  Anxiolytics/Hypnotics Failed - 05/22/2020  9:39 AM      Failed - This refill cannot be delegated      Failed - Urine Drug Screen completed in last 360 days      Passed - Valid encounter within last 6 months    Recent Outpatient Visits           Yesterday Type 2 diabetes mellitus without complication, without long-term current use of insulin [facility])   [facility] Jerrol Banana., MD   4 months ago Annual physical exam   [facility] Carles Collet M, [place]   6 months ago Type 2 diabetes mellitus without complication, without long-term current use of insulin [facility])   [facility] Jerrol Banana., MD   10 months ago Type 2 diabetes mellitus without complication, without long-term current use of insulin [facility])   [facility] Jerrol Banana., MD   1 year ago Need for influenza vaccination   [facility] [place], Dionne Bucy, MD       Future Appointments             In 6 months Jerrol Banana., MD [facility]             Signed Prescriptions Disp Refills   metoprolol succinate (TOPROL-XL) 50 MG 24 hr tablet 90 tablet 1    Sig: TAKE 1 TABLET BY MOUTH ONCE DAILY. TAKE WITH OR IMMEDIATELY FOLLOWING A MEAL.      Cardiovascular:  Beta Blockers Failed - 05/22/2020  9:39 AM      Failed - Last BP in normal range    BP Readings from Last 1 Encounters:  05/21/20 (!)  146/72          Passed - Last Heart Rate in normal range    Pulse Readings from Last 1 Encounters:  05/21/20 69          Passed - Valid encounter within last 6 months    Recent Outpatient Visits           Yesterday Type 2 diabetes mellitus without complication, without long-term current use of insulin [facility])   [facility] Jerrol Banana., MD   4 months ago Annual physical exam   [facility] Carles Collet M, [place]   6 months ago Type 2 diabetes mellitus without complication, without long-term current use of insulin [facility])   [facility] Jerrol Banana., MD   10 months ago Type 2 diabetes mellitus without complication, without long-term current use of insulin [facility])   [facility] Jerrol Banana., MD   1 year ago Need for influenza vaccination   [facility] [place], Dionne Bucy, MD       Future Appointments             In 6 months Eulas Post  Brooke Bonito., MD [facility]              lisinopril (ZESTRIL) 40 MG tablet 90 tablet 1    Sig: TAKE 1 TABLET (40 MG TOTAL) BY MOUTH DAILY.      Cardiovascular:  ACE Inhibitors Failed - 05/22/2020  9:39 AM      Failed - Cr in normal range and within 180 days    Creatinine  Date Value Ref Range Status  02/25/2013 0.96 0.60 - 1.30 mg/dL Final   Creatinine, Ser  Date Value Ref Range Status  01/10/2019 0.99 0.57 - 1.00 mg/dL Final          Failed - K in normal range and within 180 days    Potassium  Date Value Ref Range Status  01/10/2019 4.1 3.5 - 5.2 mmol/L Final  02/25/2013 3.5 3.5 - 5.1 mmol/L Final          Failed - Last BP in normal range    BP Readings from Last 1 Encounters:  05/21/20 (!) 146/72          Passed - Patient is not pregnant      Passed - Valid encounter within last 6 months    Recent Outpatient Visits           Yesterday Type 2 diabetes mellitus without complication, without  long-term current use of insulin ([place])   [facility] Jerrol Banana., MD   4 months ago Annual physical exam   [facility] Carles Collet M, [place]   6 months ago Type 2 diabetes mellitus without complication, without long-term current use of insulin [facility])   [facility] Jerrol Banana., MD   10 months ago Type 2 diabetes mellitus without complication, without long-term current use of insulin [facility])   [facility] Jerrol Banana., MD   1 year ago Need for influenza vaccination   [facility] Virginia Crews, MD       Future Appointments             In 6 months Jerrol Banana., MD [facility] [place]

## 2020-08-02 ENCOUNTER — Other Ambulatory Visit: Payer: Self-pay

## 2020-08-02 ENCOUNTER — Other Ambulatory Visit
Admission: RE | Admit: 2020-08-02 | Discharge: 2020-08-02 | Disposition: A | Discharge status: Home / Self Care | Payer: PPO | Attending: Family Medicine | Admitting: Family Medicine

## 2020-08-02 DIAGNOSIS — M76822 Posterior tibial tendinitis, left leg: Secondary | ICD-10-CM | POA: Diagnosis not present

## 2020-08-02 DIAGNOSIS — R600 Localized edema: Secondary | ICD-10-CM | POA: Insufficient documentation

## 2020-08-02 DIAGNOSIS — M19072 Primary osteoarthritis, left ankle and foot: Secondary | ICD-10-CM | POA: Insufficient documentation

## 2020-08-02 LAB — CBC WITH DIFFERENTIAL/PLATELET
Abs Immature Granulocytes: 0.05 10*3/uL (ref 0.00–0.07)
Basophils Absolute: 0.1 10*3/uL (ref 0.0–0.1)
Basophils Relative: 1 %
Eosinophils Absolute: 0.1 10*3/uL (ref 0.0–0.5)
Eosinophils Relative: 2 %
HCT: 39.3 % (ref 36.0–46.0)
Hemoglobin: 12.3 g/dL (ref 12.0–15.0)
Immature Granulocytes: 1 %
Lymphocytes Relative: 26 %
Lymphs Abs: 2.4 10*3/uL (ref 0.7–4.0)
MCH: 26.3 pg (ref 26.0–34.0)
MCHC: 31.3 g/dL (ref 30.0–36.0)
MCV: 84.2 fL (ref 80.0–100.0)
Monocytes Absolute: 0.6 10*3/uL (ref 0.1–1.0)
Monocytes Relative: 7 %
Neutro Abs: 5.9 10*3/uL (ref 1.7–7.7)
Neutrophils Relative %: 63 %
Platelets: 333 10*3/uL (ref 150–400)
RBC: 4.67 MIL/uL (ref 3.87–5.11)
RDW: 15.6 % — ABNORMAL HIGH (ref 11.5–15.5)
WBC: 9.2 10*3/uL (ref 4.0–10.5)
nRBC: 0 % (ref 0.0–0.2)

## 2020-08-02 LAB — RENAL FUNCTION PANEL
Albumin: 3.6 g/dL (ref 3.5–5.0)
Anion gap: 8 (ref 5–15)
BUN: 15 mg/dL (ref 8–23)
CO2: 26 mmol/L (ref 22–32)
Calcium: 8.9 mg/dL (ref 8.9–10.3)
Chloride: 102 mmol/L (ref 98–111)
Creatinine, Ser: 0.95 mg/dL (ref 0.44–1.00)
GFR, Estimated: >60 mL/min (ref >60)
Glucose, Bld: 99 mg/dL (ref 70–99)
Phosphorus: 3.9 mg/dL (ref 2.5–4.6)
Potassium: 3.7 mmol/L (ref 3.5–5.1)
Sodium: 136 mmol/L (ref 135–145)

## 2020-08-02 LAB — C-REACTIVE PROTEIN: CRP: 3.2 mg/dL — ABNORMAL HIGH (ref <1.0)

## 2020-08-02 LAB — URIC ACID: Uric Acid, Serum: 5.8 mg/dL (ref 2.5–7.1)

## 2020-08-02 LAB — HEMOGLOBIN A1C
Hgb A1c MFr Bld: 6.5 % — ABNORMAL HIGH (ref 4.8–5.6)
Mean Plasma Glucose: 139.85 mg/dL

## 2020-08-09 LAB — MISC LABCORP TEST (SEND OUT): Labcorp test code: 6924

## 2020-08-12 ENCOUNTER — Other Ambulatory Visit: Payer: Self-pay | Admitting: Rheumatology

## 2020-08-12 DIAGNOSIS — M8949 Other hypertrophic osteoarthropathy, multiple sites: Secondary | ICD-10-CM | POA: Diagnosis not present

## 2020-08-12 DIAGNOSIS — M19079 Primary osteoarthritis, unspecified ankle and foot: Secondary | ICD-10-CM | POA: Diagnosis not present

## 2020-08-12 DIAGNOSIS — R609 Edema, unspecified: Secondary | ICD-10-CM | POA: Diagnosis not present

## 2020-08-12 DIAGNOSIS — M1990 Unspecified osteoarthritis, unspecified site: Secondary | ICD-10-CM | POA: Diagnosis not present

## 2020-08-12 DIAGNOSIS — M1A00X0 Idiopathic chronic gout, unspecified site, without tophus (tophi): Secondary | ICD-10-CM | POA: Diagnosis not present

## 2020-09-03 ENCOUNTER — Encounter (INDEPENDENT_AMBULATORY_CARE_PROVIDER_SITE_OTHER): Payer: Self-pay | Admitting: Vascular Surgery

## 2020-09-03 ENCOUNTER — Other Ambulatory Visit: Payer: Self-pay

## 2020-09-03 ENCOUNTER — Ambulatory Visit (INDEPENDENT_AMBULATORY_CARE_PROVIDER_SITE_OTHER): Payer: PPO | Admitting: Vascular Surgery

## 2020-09-03 VITALS — BP 165/112 | HR 74 | Resp 16 | Ht 61.0 in | Wt 216.0 lb

## 2020-09-03 DIAGNOSIS — M7989 Other specified soft tissue disorders: Secondary | ICD-10-CM | POA: Insufficient documentation

## 2020-09-03 DIAGNOSIS — R7303 Prediabetes: Secondary | ICD-10-CM | POA: Diagnosis not present

## 2020-09-03 DIAGNOSIS — I10 Essential (primary) hypertension: Secondary | ICD-10-CM

## 2020-09-03 NOTE — Assessment & Plan Note (Signed)
blood pressure control important in reducing the progression of atherosclerotic disease. On appropriate oral medications.  

## 2020-09-03 NOTE — Assessment & Plan Note (Signed)

## 2020-09-03 NOTE — Assessment & Plan Note (Signed)
blood glucose control important in reducing the progression of atherosclerotic disease. Also, involved in wound healing.   

## 2020-09-03 NOTE — Patient Instructions (Signed)
Edema  Edema is when you have too much fluid in your body or under your skin. Edema may make your legs, feet, and ankles swell up. Swelling is also common in looser tissues, like around your eyes. This is a common condition. It gets more common as you get older. There are many possible causes of edema. Eating too much salt (sodium) and being on your feet or sitting for a long time can cause edema in your legs, feet, and ankles. Hot weather may make edema worse. Edema is usually painless. Your skin may look swollen or shiny. Follow these instructions at home:  Keep the swollen body part raised (elevated) above the level of your heart when you are sitting or lying down.  Do not sit still or stand for a long time.  Do not wear tight clothes. Do not wear garters on your upper legs.  Exercise your legs. This can help the swelling go down.  Wear elastic bandages or support stockings as told by your doctor.  Eat a low-salt (low-sodium) diet to reduce fluid as told by your doctor.  Depending on the cause of your swelling, you may need to limit how much fluid you drink (fluid restriction).  Take over-the-counter and prescription medicines only as told by your doctor. Contact a doctor if:  Treatment is not working.  You have heart, liver, or kidney disease and have symptoms of edema.  You have sudden and unexplained weight gain. Get help right away if:  You have shortness of breath or chest pain.  You cannot breathe when you lie down.  You have pain, redness, or warmth in the swollen areas.  You have heart, liver, or kidney disease and get edema all of a sudden.  You have a fever and your symptoms get worse all of a sudden. Summary  Edema is when you have too much fluid in your body or under your skin.  Edema may make your legs, feet, and ankles swell up. Swelling is also common in looser tissues, like around your eyes.  Raise (elevate) the swollen body part above the level of your  heart when you are sitting or lying down.  Follow your doctor's instructions about diet and how much fluid you can drink (fluid restriction). This information is not intended to replace advice given to you by your health care provider. Make sure you discuss any questions you have with your health care provider. Document Revised: 04/17/2020 Document Reviewed: 04/17/2020 Elsevier Patient Education  2021 Elsevier Inc.  

## 2020-09-03 NOTE — Progress Notes (Signed)
Patient ID: Ruth Higgins, female   DOB: Apr 15, 1956, 65 y.o.   MRN: [id number]  Chief Complaint  Patient presents with  . New Patient (Initial Visit)    Ref Jefm Bryant edema    HPI Ruth Higgins is a 65 y.o. female.  I am asked to see the patient by Dr. Lindon Romp for evaluation of left leg swelling. This has been going on for about 8 months now. It started in June and has had generally worsening symptoms of pain and swelling since that time. There was a span for about a month when she went to live with her son who is a massage therapist and he did massage on her legs that got the swelling under better control. She has a previous history of 2 stress fractures in the left foot as well as knee replacement on the left side. She had a right knee replacement as well but did better from that has not really had swelling on the right side. This is very painful to her. She has been seen by multiple doctors and been treated for gout without improvement. No previous history of DVT and has had a negative DVT study in the past. She denies any open wounds. No fevers or chills. No real symptoms in the right leg. She does have a previous history of a large ovarian cyst of about 30 pounds removed as well as a ventral hernia repair several years ago.     Past Medical History:  Diagnosis Date  . Arthritis   . Family history of adverse reaction to anesthesia    brother hard to wake up after surgery  . Hypertension   . PONV (postoperative nausea and vomiting) 1980s  . Pre-diabetes     Past Surgical History:  Procedure Laterality Date  . ABDOMINAL HYSTERECTOMY  2014   Dr. Sabra Heck  . APPENDECTOMY    . HERNIA REPAIR  2014   Dr. Pat Patrick  . KNEE ARTHROPLASTY Right 01/31/2018   Procedure: COMPUTER ASSISTED TOTAL KNEE ARTHROPLASTY;  Surgeon: Dereck Leep, MD;  Location: [facility] ORS;  Service: Orthopedics;  Laterality: Right;  . KNEE ARTHROPLASTY Left 07/04/2018   Procedure: COMPUTER ASSISTED TOTAL KNEE  ARTHROPLASTY;  Surgeon: Dereck Leep, MD;  Location: [facility] ORS;  Service: Orthopedics;  Laterality: Left;  . KNEE SURGERY Bilateral 2007  . LIPOMA EXCISION  1999   of the left side of the neck  . NECK SURGERY    . SALPINGOOPHORECTOMY Bilateral   . TONSILLECTOMY    . TUBAL LIGATION       Family History  Problem Relation Age of Onset  . Alzheimer's disease Father   . Heart disease Father   . Hypothyroidism Sister   . Hypertension Brother   . Polycystic kidney disease Brother   . Depression Sister   . Hypertension Sister   . Anxiety disorder Sister   . Stroke Mother   . Rectal cancer Brother      Social History   Tobacco Use  . Smoking status: Current Every Day Smoker    Packs/day: 1.00    Years: 48.00    Pack years: 48.00    Types: Cigarettes  . Smokeless tobacco: Never Used  . Tobacco comment: smokes due to stress; has quit several times  Vaping Use  . Vaping Use: Never used  Substance Use Topics  . Alcohol use: No    Alcohol/week: 0.0 standard drinks  . Drug use: No    Allergies  Allergen Reactions  .  Ceclor [Cefaclor] Anaphylaxis and Hives  . Demerol [Meperidine] Nausea And Vomiting  . Erythromycin     EES caused jaundice like symptoms   . Flagyl [Metronidazole] Swelling  . Hydrochlorothiazide     severe cramping in her legs/knee/calves  . Hydrocodone-Acetaminophen Nausea And Vomiting  . Morphine And Related     Pt prefers to not be given morphine, tolerates hydromorphone instead   . Sulfa Antibiotics Swelling  . Penicillins Hives and Rash    Has patient had a PCN reaction causing immediate rash, facial/tongue/throat swelling, SOB or lightheadedness with hypotension: Yes Has patient had a PCN reaction causing severe rash involving mucus membranes or skin necrosis: Yes Has patient had a PCN reaction that required hospitalization: Yes Has patient had a PCN reaction occurring within the last 10 years: No If all of the above answers are "NO", then may  proceed with Cephalosporin use.     Current Outpatient Medications  Medication Sig Dispense Refill  . allopurinol (ZYLOPRIM) 100 MG tablet Take 100 mg by mouth daily.    . fluticasone (FLONASE) 50 MCG/ACT nasal spray Place 2 sprays into both nostrils daily. 18.2 mL 2  . lisinopril (ZESTRIL) 40 MG tablet Take 1 tablet (40 mg total) by mouth daily. 90 tablet 1  . lisinopril (ZESTRIL) 40 MG tablet TAKE 1 TABLET (40 MG TOTAL) BY MOUTH DAILY. 90 tablet 1  . LORazepam (ATIVAN) 0.5 MG tablet Take 1 tablet (0.5 mg total) by mouth 2 (two) times daily as needed. 60 tablet 1  . metoprolol succinate (TOPROL-XL) 50 MG 24 hr tablet TAKE 1 TABLET BY MOUTH ONCE DAILY. TAKE WITH OR IMMEDIATELY FOLLOWING A MEAL. 90 tablet 1  . enoxaparin (LOVENOX) 40 MG/0.4ML injection Inject 0.4 mLs (40 mg total) into the skin daily for 14 days. 14 Syringe 0  . ergocalciferol (VITAMIN D2) 1.25 MG (50000 UT) capsule Take 50,000 Units by mouth once a week. (Patient not taking: Reported on 09/03/2020)    . metoprolol succinate (TOPROL-XL) 50 MG 24 hr tablet TAKE 1 TABLET BY MOUTH ONCE DAILY. TAKE WITH OR IMMEDIATELY FOLLOWING A MEAL. (Patient not taking: Reported on 09/03/2020) 90 tablet 1  . nicotine (NICODERM CQ - DOSED IN MG/24 HOURS) 21 mg/24hr patch Place 21 mg onto the skin daily as needed (smoke cessation).  (Patient not taking: No sig reported)    . Pseudoephedrine-Ibuprofen 30-200 MG TABS Take 1 tablet by mouth every 4 (four) hours as needed. (Patient not taking: Reported on 09/03/2020)    . spironolactone (ALDACTONE) 25 MG tablet Take 1 tablet (25 mg total) by mouth daily. (Patient not taking: Reported on 09/03/2020) 30 tablet 0  . traMADol (ULTRAM) 50 MG tablet Take 1-2 tablets (50-100 mg total) by mouth every 6 (six) hours as needed for moderate pain. (Patient not taking: No sig reported) 60 tablet 0   No current facility-administered medications for this visit.      REVIEW OF SYSTEMS (Negative unless  checked)  Constitutional: $RemoveBeforeDE'[]'GenlDyLAUdpvbpy$ Weight loss  $Rem'[]'SGZH$ Fever  $Remo'[]'WYejG$ Chills Cardiac: $RemoveBeforeD'[]'QgcPzUsCGWdQjK$ Chest pain   '[]'$ Chest pressure   '[]'$ Palpitations   '[]'$ Shortness of breath when laying flat   '[]'$ Shortness of breath at rest   '[]'$ Shortness of breath with exertion. Vascular:  $RemoveBe'[x]'bFDRbvwrt$ Pain in legs with walking   '[x]'$ Pain in legs at rest   '[]'$ Pain in legs when laying flat   '[]'$ Claudication   '[]'$ Pain in feet when walking  $Remove'[]'qVBTpzK$ Pain in feet at rest  $Rem'[]'bHoB$ Pain in feet when laying flat   '[]'$ History of DVT   '[]'$ Phlebitis   [  x]Swelling in legs   [] Varicose veins   [] Non-healing ulcers Pulmonary:   [] Uses home oxygen   [] Productive cough   [] Hemoptysis   [] Wheeze  [] COPD   [] Asthma Neurologic:  [] Dizziness  [] Blackouts   [] Seizures   [] History of stroke   [] History of TIA  [] Aphasia   [] Temporary blindness   [] Dysphagia   [] Weakness or numbness in arms   [] Weakness or numbness in legs Musculoskeletal:  [x] Arthritis   [] Joint swelling   [x] Joint pain   [] Low back pain Hematologic:  [] Easy bruising  [] Easy bleeding   [] Hypercoagulable state   [] Anemic  [] Hepatitis Gastrointestinal:  [] Blood in stool   [] Vomiting blood  [x] Gastroesophageal reflux/heartburn   [] Abdominal pain Genitourinary:  [] Chronic kidney disease   [] Difficult urination  [] Frequent urination  [] Burning with urination   [] Hematuria Skin:  [] Rashes   [] Ulcers   [] Wounds Psychological:  [] History of anxiety   []  History of major depression.    Physical Exam BP (!) 165/112 (BP Location: Right Arm)   Pulse 74   Resp 16   Ht 5\' 1"  (1.549 m)   Wt 216 lb (98 kg)   BMI 40.81 kg/m  Gen:  WD/WN, NAD Head: [place]/AT, No temporalis wasting.  Ear/Nose/Throat: Hearing grossly intact, nares w/o erythema or drainage, oropharynx w/o Erythema/Exudate Eyes: Conjunctiva clear, sclera non-icteric  Neck: trachea midline.  No JVD.  Pulmonary:  Good air movement, respirations not labored, no use of accessory muscles  Cardiac: RRR, no JVD Vascular:  Vessel Right Left  Radial Palpable Palpable                           DP 2+ 1+  PT 1+ NP   Gastrointestinal:. No masses, surgical incisions, or scars. Musculoskeletal: M/S 5/5 throughout.  Extremities without ischemic changes.  No deformity or atrophy. No right leg edema, 2+ LLE edema. Neurologic: Sensation grossly intact in extremities.  Symmetrical.  Speech is fluent. Motor exam as listed above. Psychiatric: Judgment intact, Mood & affect appropriate for pt's clinical situation. Dermatologic: No rashes or ulcers noted.  No cellulitis or open wounds.    Radiology No results found.  Labs Recent Results (from the past 2160 hour(s))  C-reactive protein     Status: Abnormal   Collection Time: 08/02/20  4:14 PM  Result Value Ref Range   CRP 3.2 (H) <1.0 mg/dL    Comment: Performed at [facility] [address] [place], [place] [address]  Renal function panel     Status: None   Collection Time: 08/02/20  4:14 PM  Result Value Ref Range   Sodium 136 135 - 145 mmol/L   Potassium 3.7 3.5 - 5.1 mmol/L   Chloride 102 98 - 111 mmol/L   CO2 26 22 - 32 mmol/L   Glucose, Bld 99 70 - 99 mg/dL    Comment: Glucose reference range applies only to samples taken after fasting for at least 8 hours.   BUN 15 8 - 23 mg/dL   Creatinine, Ser 0.95 0.44 - 1.00 mg/dL   Calcium 8.9 8.9 - 10.3 mg/dL   Phosphorus 3.9 2.5 - 4.6 mg/dL   Albumin 3.6 3.5 - 5.0 g/dL   GFR, Estimated >60 >60 mL/min    Comment: (NOTE) Calculated using the CKD-EPI Creatinine Equation (2021)    Anion gap 8 5 - 15    Comment: Performed at [facility], [address] [place],  [address]  Uric acid  Status: None   Collection Time: 08/02/20  4:14 PM  Result Value Ref Range   Uric Acid, Serum 5.8 2.5 - 7.1 mg/dL    Comment: Performed at [facility], [address], [place], [place] [address]  CBC with Differential/Platelet     Status: Abnormal   Collection Time: 08/02/20  4:14 PM  Result Value Ref Range   WBC 9.2 4.0 - 10.5 K/uL    RBC 4.67 3.87 - 5.11 MIL/uL   Hemoglobin 12.3 12.0 - 15.0 g/dL   HCT 39.3 36.0 - 46.0 %   MCV 84.2 80.0 - 100.0 fL   MCH 26.3 26.0 - 34.0 pg   MCHC 31.3 30.0 - 36.0 g/dL   RDW 15.6 (H) 11.5 - 15.5 %   Platelets 333 150 - 400 K/uL   nRBC 0.0 0.0 - 0.2 %   Neutrophils Relative % 63 %   Neutro Abs 5.9 1.7 - 7.7 K/uL   Lymphocytes Relative 26 %   Lymphs Abs 2.4 0.7 - 4.0 K/uL   Monocytes Relative 7 %   Monocytes Absolute 0.6 0.1 - 1.0 K/uL   Eosinophils Relative 2 %   Eosinophils Absolute 0.1 0.0 - 0.5 K/uL   Basophils Relative 1 %   Basophils Absolute 0.1 0.0 - 0.1 K/uL   Immature Granulocytes 1 %   Abs Immature Granulocytes 0.05 0.00 - 0.07 K/uL    Comment: Performed at [facility], [address] [place], [place] [address]  Hemoglobin A1c     Status: Abnormal   Collection Time: 08/02/20  4:14 PM  Result Value Ref Range   Hgb A1c MFr Bld 6.5 (H) 4.8 - 5.6 %    Comment: (NOTE) Pre diabetes:          5.7%-6.4%  Diabetes:              >6.4%  Glycemic control for   <7.0% adults with diabetes    Mean Plasma Glucose 139.85 mg/dL    Comment: Performed at [facility], [place] [address], [place], [place] [address]  Miscellaneous [facility] test (send-out)     Status: None   Collection Time: 08/02/20  4:14 PM  Result Value Ref Range   [facility] test code [id number]    [facility] test name HLA MATCHED PRODUCT     Comment: Performed at [facility], [address], [place], [place] [address]   Misc [facility] result COMMENT     Comment: (NOTE) Test Ordered: 154008 HLA B 27 Disease Association HLA-B27                        Negative                  2Q   HLA-B*27 Negative B27 allele interpretation for all loci based on IMGT/HLA database version 3.44 This test was developed and its performance characteristics determined by [facility].  It has not been cleared or approved by the Food and Drug Administration. HLA Lab CLIA ID Number [id number] This test was performed  using PCR (Polymerase Chain Reaction)/SSOP (Sequence Specific Oligonucleotide Probes) technique.  SBT (Sequence Based Typing) and/or SSP (Sequence Specific Primers) may be used as supplemental methods when necessary.  Please contact HLA Customer Service at [phone] if you have any questions. Director of HLA Laboratory Dr Brooks Sailors, PhD Performed At: [facility] [place], [place] [id number] Rush Farmer MD JQ:[id number] Performed At: [facility]  [place], [place] [id number] Elvera Maria PhD YV:[id number]     Assessment/Plan:  Swelling of limb I have had a long discussion with the patient regarding swelling and why it  causes symptoms.  Patient will begin wearing graduated compression stockings class 1 (20-30 mmHg) on a daily basis a prescription was given. The patient will  beginning wearing the stockings first thing in the morning and removing them in the evening. The patient is instructed specifically not to sleep in the stockings.   In addition, behavioral modification will be initiated.  This will include frequent elevation, use of over the counter pain medications and exercise such as walking.  I have reviewed systemic causes for chronic edema such as liver, kidney and cardiac etiologies.  The patient denies problems with these organ systems.    Consideration for a lymph pump will also be made based upon the effectiveness of conservative therapy.  This would help to improve the edema control and prevent sequela such as ulcers and infections   Patient should undergo duplex ultrasound of the venous system to ensure that DVT or reflux is not present.  The patient will follow-up with me after the ultrasound.    Essential (primary) hypertension blood pressure control important in reducing the progression of atherosclerotic disease. On appropriate oral medications.   Prediabetes blood glucose control important in  reducing the progression of atherosclerotic disease. Also, involved in wound healing.       Leotis Pain 09/03/2020, 10:13 AM   This note was created with Dragon medical transcription system.  Any errors from dictation are unintentional.

## 2020-09-10 ENCOUNTER — Other Ambulatory Visit: Payer: Self-pay

## 2020-09-10 ENCOUNTER — Other Ambulatory Visit
Admission: RE | Admit: 2020-09-10 | Discharge: 2020-09-10 | Disposition: A | Discharge status: Home / Self Care | Payer: PPO | Attending: Rheumatology | Admitting: Rheumatology

## 2020-09-10 DIAGNOSIS — M13879 Other specified arthritis, unspecified ankle and foot: Secondary | ICD-10-CM | POA: Diagnosis not present

## 2020-09-10 LAB — C-REACTIVE PROTEIN: CRP: 3.1 mg/dL — ABNORMAL HIGH (ref <1.0)

## 2020-09-10 LAB — SEDIMENTATION RATE: Sed Rate: 58 mm/hr — ABNORMAL HIGH (ref 0–30)

## 2020-09-10 LAB — URIC ACID: Uric Acid, Serum: 4.5 mg/dL (ref 2.5–7.1)

## 2020-09-10 LAB — CREATININE, SERUM
Creatinine, Ser: 0.93 mg/dL (ref 0.44–1.00)
GFR, Estimated: >60 mL/min (ref >60)

## 2020-09-16 ENCOUNTER — Other Ambulatory Visit: Payer: Self-pay | Admitting: Rheumatology

## 2020-09-16 DIAGNOSIS — M1990 Unspecified osteoarthritis, unspecified site: Secondary | ICD-10-CM | POA: Diagnosis not present

## 2020-09-16 DIAGNOSIS — M7732 Calcaneal spur, left foot: Secondary | ICD-10-CM | POA: Diagnosis not present

## 2020-09-16 DIAGNOSIS — M8949 Other hypertrophic osteoarthropathy, multiple sites: Secondary | ICD-10-CM | POA: Diagnosis not present

## 2020-09-16 DIAGNOSIS — M1A00X0 Idiopathic chronic gout, unspecified site, without tophus (tophi): Secondary | ICD-10-CM | POA: Diagnosis not present

## 2020-09-16 DIAGNOSIS — M19072 Primary osteoarthritis, left ankle and foot: Secondary | ICD-10-CM | POA: Diagnosis not present

## 2020-09-16 DIAGNOSIS — M7989 Other specified soft tissue disorders: Secondary | ICD-10-CM | POA: Diagnosis not present

## 2020-09-24 ENCOUNTER — Other Ambulatory Visit: Payer: Self-pay

## 2020-09-24 ENCOUNTER — Encounter (INDEPENDENT_AMBULATORY_CARE_PROVIDER_SITE_OTHER): Payer: Self-pay | Admitting: Vascular Surgery

## 2020-09-24 ENCOUNTER — Ambulatory Visit (INDEPENDENT_AMBULATORY_CARE_PROVIDER_SITE_OTHER): Payer: PPO | Admitting: Vascular Surgery

## 2020-09-24 ENCOUNTER — Ambulatory Visit (INDEPENDENT_AMBULATORY_CARE_PROVIDER_SITE_OTHER): Payer: PPO

## 2020-09-24 VITALS — BP 173/114 | HR 69 | Ht 61.0 in | Wt 213.0 lb

## 2020-09-24 DIAGNOSIS — R600 Localized edema: Secondary | ICD-10-CM

## 2020-09-24 DIAGNOSIS — M7989 Other specified soft tissue disorders: Secondary | ICD-10-CM

## 2020-09-24 DIAGNOSIS — I890 Lymphedema, not elsewhere classified: Secondary | ICD-10-CM

## 2020-09-24 DIAGNOSIS — I10 Essential (primary) hypertension: Secondary | ICD-10-CM

## 2020-09-24 DIAGNOSIS — R7303 Prediabetes: Secondary | ICD-10-CM

## 2020-09-24 NOTE — Assessment & Plan Note (Signed)
She underwent a venous reflux study today which was negative for significant venous disease in the left lower extremity.  There was no DVT or superficial thrombophlebitis seen in the left leg.  There was no significant venous reflux identified.  A 3.2 cm Baker's cyst was noted in the left popliteal space.  The swelling is slightly better with more regular use of compression socks and elevation, but is still quite prominent her pain is still quite significant.  At this point, the patient does not have venous disease requiring treatment.  She has at least stage II lymphedema with swelling and skin changes refractory to compression and elevation.  I would recommend a lymphedema pump be added as an adjuvant therapy to improve her symptoms of pain and swelling.  She will continue to get her evaluation for potential gout or other issues.  Anti-inflammatories seem to be helping some as well and she does have a Baker's cyst so there may be an inflammatory process going on some as well.  We will plan to see her back in 4 to 6 months and follow-up after she gets her lymphedema pump to assess her response to therapy.  She will do this once or twice daily for 30 to 60 minutes a day.

## 2020-09-24 NOTE — Progress Notes (Signed)
MRN : [id number]  Ruth Higgins is a 65 y.o. (04/01/1956) female who presents with chief complaint of  Chief Complaint  Patient presents with  . Follow-up    U/S  .  History of Present Illness: Patient returns today in follow up of her left leg swelling.  She has gotten her compression socks and that has resulted in a mild improvement in the pain and swelling in the left lower leg.  She is still being treated for potential gout and is going to get a fluid aspiration from her foot in the near future which she is not very excited about.  She has been trying to elevate her legs.  She continues to lose weight and has lost a couple more pounds since her last visit.  No ulceration or infection.  She underwent a venous reflux study today which was negative for significant venous disease in the left lower extremity.  There was no DVT or superficial thrombophlebitis seen in the left leg.  There was no significant venous reflux identified.  A 3.2 cm Baker's cyst was noted in the left popliteal space.  Current Outpatient Medications  Medication Sig Dispense Refill  . allopurinol (ZYLOPRIM) 100 MG tablet Take 100 mg by mouth daily.    . ergocalciferol (VITAMIN D2) 1.25 MG (50000 UT) capsule Take 50,000 Units by mouth once a week.    . fluticasone (FLONASE) 50 MCG/ACT nasal spray Place 2 sprays into both nostrils daily. 18.2 mL 2  . lisinopril (ZESTRIL) 40 MG tablet Take 1 tablet (40 mg total) by mouth daily. 90 tablet 1  . lisinopril (ZESTRIL) 40 MG tablet TAKE 1 TABLET (40 MG TOTAL) BY MOUTH DAILY. 90 tablet 1  . LORazepam (ATIVAN) 0.5 MG tablet Take 1 tablet (0.5 mg total) by mouth 2 (two) times daily as needed. 60 tablet 1  . meloxicam (MOBIC) 7.5 MG tablet Take by mouth.    . metoprolol succinate (TOPROL-XL) 50 MG 24 hr tablet TAKE 1 TABLET BY MOUTH ONCE DAILY. TAKE WITH OR IMMEDIATELY FOLLOWING A MEAL. 90 tablet 1  . metoprolol succinate (TOPROL-XL) 50 MG 24 hr tablet TAKE 1 TABLET BY MOUTH  ONCE DAILY. TAKE WITH OR IMMEDIATELY FOLLOWING A MEAL. 90 tablet 1  . nicotine (NICODERM CQ - DOSED IN MG/24 HOURS) 21 mg/24hr patch Place 21 mg onto the skin daily as needed (smoke cessation).    . Pseudoephedrine-Ibuprofen 30-200 MG TABS Take 1 tablet by mouth every 4 (four) hours as needed.    Marland Kitchen spironolactone (ALDACTONE) 25 MG tablet Take 1 tablet (25 mg total) by mouth daily. 30 tablet 0  . traMADol (ULTRAM) 50 MG tablet Take 1-2 tablets (50-100 mg total) by mouth every 6 (six) hours as needed for moderate pain. 60 tablet 0  . enoxaparin (LOVENOX) 40 MG/0.4ML injection Inject 0.4 mLs (40 mg total) into the skin daily for 14 days. 14 Syringe 0   No current facility-administered medications for this visit.    Past Medical History:  Diagnosis Date  . Arthritis   . Family history of adverse reaction to anesthesia    brother hard to wake up after surgery  . Hypertension   . PONV (postoperative nausea and vomiting) 1980s  . Pre-diabetes     Past Surgical History:  Procedure Laterality Date  . ABDOMINAL HYSTERECTOMY  2014   Dr. Sabra Heck  . APPENDECTOMY    . HERNIA REPAIR  2014   Dr. Pat Patrick  . KNEE ARTHROPLASTY Right 01/31/2018   Procedure:  COMPUTER ASSISTED TOTAL KNEE ARTHROPLASTY;  Surgeon: Dereck Leep, MD;  Location: [facility] ORS;  Service: Orthopedics;  Laterality: Right;  . KNEE ARTHROPLASTY Left 07/04/2018   Procedure: COMPUTER ASSISTED TOTAL KNEE ARTHROPLASTY;  Surgeon: Dereck Leep, MD;  Location: [facility] ORS;  Service: Orthopedics;  Laterality: Left;  . KNEE SURGERY Bilateral 2007  . LIPOMA EXCISION  1999   of the left side of the neck  . NECK SURGERY    . SALPINGOOPHORECTOMY Bilateral   . TONSILLECTOMY    . TUBAL LIGATION       Social History   Tobacco Use  . Smoking status: Current Every Day Smoker    Packs/day: 1.00    Years: 48.00    Pack years: 48.00    Types: Cigarettes  . Smokeless tobacco: Never Used  . Tobacco comment: smokes due to stress; has quit several  times  Vaping Use  . Vaping Use: Never used  Substance Use Topics  . Alcohol use: No    Alcohol/week: 0.0 standard drinks  . Drug use: No       Family History  Problem Relation Age of Onset  . Alzheimer's disease Father   . Heart disease Father   . Hypothyroidism Sister   . Hypertension Brother   . Polycystic kidney disease Brother   . Depression Sister   . Hypertension Sister   . Anxiety disorder Sister   . Stroke Mother   . Rectal cancer Brother      Allergies  Allergen Reactions  . Ceclor [Cefaclor] Anaphylaxis and Hives  . Demerol [Meperidine] Nausea And Vomiting  . Erythromycin     EES caused jaundice like symptoms   . Flagyl [Metronidazole] Swelling  . Hydrochlorothiazide     severe cramping in her legs/knee/calves  . Hydrocodone-Acetaminophen Nausea And Vomiting  . Morphine And Related     Pt prefers to not be given morphine, tolerates hydromorphone instead   . Sulfa Antibiotics Swelling  . Penicillins Hives and Rash    Has patient had a PCN reaction causing immediate rash, facial/tongue/throat swelling, SOB or lightheadedness with hypotension: Yes Has patient had a PCN reaction causing severe rash involving mucus membranes or skin necrosis: Yes Has patient had a PCN reaction that required hospitalization: Yes Has patient had a PCN reaction occurring within the last 10 years: No If all of the above answers are "NO", then may proceed with Cephalosporin use.      REVIEW OF SYSTEMS (Negative unless checked)  Constitutional: $RemoveBeforeDE'[]'DiGJGSmFtoqrajp$ ?Weight loss  $Rem'[]'zdiu$ ?Fever  $Remov'[]'jkPNkj$ ?Chills Cardiac: $RemoveBeforeDE'[]'DPjRfhQBzRlekGO$ ?Chest pain   '[]'$ ?Chest pressure   '[]'$ ?Palpitations   '[]'$ ?Shortness of breath when laying flat   '[]'$ ?Shortness of breath at rest   '[]'$ ?Shortness of breath with exertion. Vascular:  $RemoveBe'[x]'EBpbYgpUm$ ?Pain in legs with walking   '[x]'$ ?Pain in legs at rest   '[]'$ ?Pain in legs when laying flat   '[]'$ ?Claudication   '[]'$ ?Pain in feet when walking  $Remove'[]'CMhqOMi$ ?Pain in feet at rest  $Rem'[]'IaVT$ ?Pain in feet when laying flat   '[]'$ ?History of DVT    '[]'$ ?Phlebitis   '[x]'$ ?Swelling in legs   '[]'$ ?Varicose veins   '[]'$ ?Non-healing ulcers Pulmonary:   '[]'$ ?Uses home oxygen   '[]'$ ?Productive cough   '[]'$ ?Hemoptysis   '[]'$ ?Wheeze  $Remove'[]'amXUoCT$ ?COPD   '[]'$ ?Asthma Neurologic:  $RemoveBefo'[]'eVupeIdRrYH$ ?Dizziness  $RemoveBef'[]'BFTOqkAVhT$ ?Blackouts   '[]'$ ?Seizures   '[]'$ ?History of stroke   '[]'$ ?History of TIA  $Re'[]'sYm$ ?Aphasia   '[]'$ ?Temporary blindness   '[]'$ ?Dysphagia   '[]'$ ?Weakness or numbness in arms   '[]'$ ?Weakness or numbness in legs Musculoskeletal:  $RemoveBeforeDEI'[x]'nwOeHehZhgDaByBD$ ?Arthritis   '[]'$ ?Joint  swelling   [x] ?Joint pain   [] ?Low back pain Hematologic:  [] ?Easy bruising  [] ?Easy bleeding   [] ?Hypercoagulable state   [] ?Anemic  [] ?Hepatitis Gastrointestinal:  [] ?Blood in stool   [] ?Vomiting blood  [x] ?Gastroesophageal reflux/heartburn   [] ?Abdominal pain Genitourinary:  [] ?Chronic kidney disease   [] ?Difficult urination  [] ?Frequent urination  [] ?Burning with urination   [] ?Hematuria Skin:  [] ?Rashes   [] ?Ulcers   [] ?Wounds Psychological:  [x] ?History of anxiety   [] ? History of major depression.  Physical Examination  BP (!) 173/114   Pulse 69   Ht 5\' 1"  (1.549 m)   Wt 213 lb (96.6 kg)   BMI 40.25 kg/m  Gen:  WD/WN, NAD Head: [place]/AT, No temporalis wasting. Ear/Nose/Throat: Hearing grossly intact, nares w/o erythema or drainage Eyes: Conjunctiva clear. Sclera non-icteric Neck: Supple.  Trachea midline Pulmonary:  Good air movement, no use of accessory muscles.  Cardiac: RRR, no JVD Vascular:  Vessel Right Left  Radial Palpable Palpable                          PT  2+ palpable  1+ palpable  DP  1+ palpable  not palpable   Gastrointestinal: soft, non-tender/non-distended. No guarding/reflex.  Musculoskeletal: M/S 5/5 throughout.  No deformity or atrophy.  No right lower extremity edema.  1-2+ left lower extremity edema. Neurologic: Sensation grossly intact in extremities.  Symmetrical.  Speech is fluent.  Psychiatric: Judgment intact, Mood & affect appropriate for pt's clinical situation. Dermatologic: No rashes or ulcers noted.  No  cellulitis or open wounds.       Labs Recent Results (from the past 2160 hour(s))  C-reactive protein     Status: Abnormal   Collection Time: 08/02/20  4:14 PM  Result Value Ref Range   CRP 3.2 (H) <1.0 mg/dL    Comment: Performed at [facility] [address] [place], [place] [address]  Renal function panel     Status: None   Collection Time: 08/02/20  4:14 PM  Result Value Ref Range   Sodium 136 135 - 145 mmol/L   Potassium 3.7 3.5 - 5.1 mmol/L   Chloride 102 98 - 111 mmol/L   CO2 26 22 - 32 mmol/L   Glucose, Bld 99 70 - 99 mg/dL    Comment: Glucose reference range applies only to samples taken after fasting for at least 8 hours.   BUN 15 8 - 23 mg/dL   Creatinine, Ser 0.95 0.44 - 1.00 mg/dL   Calcium 8.9 8.9 - 10.3 mg/dL   Phosphorus 3.9 2.5 - 4.6 mg/dL   Albumin 3.6 3.5 - 5.0 g/dL   GFR, Estimated >60 >60 mL/min    Comment: (NOTE) Calculated using the CKD-EPI Creatinine Equation (2021)    Anion gap 8 5 - 15    Comment: Performed at [facility], [address] [place], [place] [address]  Uric acid     Status: None   Collection Time: 08/02/20  4:14 PM  Result Value Ref Range   Uric Acid, Serum 5.8 2.5 - 7.1 mg/dL    Comment: Performed at [facility], [address], [place], [place] [address]  CBC with Differential/Platelet     Status: Abnormal   Collection Time: 08/02/20  4:14 PM  Result Value Ref Range   WBC 9.2 4.0 - 10.5 K/uL   RBC 4.67 3.87 - 5.11 MIL/uL   Hemoglobin 12.3 12.0 - 15.0 g/dL   HCT 39.3  36.0 - 46.0 %   MCV 84.2 80.0 - 100.0 fL   MCH 26.3 26.0 - 34.0 pg   MCHC 31.3 30.0 - 36.0 g/dL   RDW 15.6 (H) 11.5 - 15.5 %   Platelets 333 150 - 400 K/uL   nRBC 0.0 0.0 - 0.2 %   Neutrophils Relative % 63 %   Neutro Abs 5.9 1.7 - 7.7 K/uL   Lymphocytes Relative 26 %   Lymphs Abs 2.4 0.7 - 4.0 K/uL   Monocytes Relative 7 %   Monocytes Absolute 0.6 0.1 - 1.0 K/uL   Eosinophils Relative 2 %   Eosinophils Absolute 0.1  0.0 - 0.5 K/uL   Basophils Relative 1 %   Basophils Absolute 0.1 0.0 - 0.1 K/uL   Immature Granulocytes 1 %   Abs Immature Granulocytes 0.05 0.00 - 0.07 K/uL    Comment: Performed at [facility], [address] [place], [place] [address]  Hemoglobin A1c     Status: Abnormal   Collection Time: 08/02/20  4:14 PM  Result Value Ref Range   Hgb A1c MFr Bld 6.5 (H) 4.8 - 5.6 %    Comment: (NOTE) Pre diabetes:          5.7%-6.4%  Diabetes:              >6.4%  Glycemic control for   <7.0% adults with diabetes    Mean Plasma Glucose 139.85 mg/dL    Comment: Performed at [facility], [place] [address], [place], [place] [address]  Miscellaneous [facility] test (send-out)     Status: None   Collection Time: 08/02/20  4:14 PM  Result Value Ref Range   [facility] test code [id number]    [facility] test name HLA MATCHED PRODUCT     Comment: Performed at [facility], [address] [place], [place] [address]   Misc [facility] result COMMENT     Comment: (NOTE) Test Ordered: [id number] HLA B 27 Disease Association HLA-B27                        Negative                  2Q   HLA-B*27 Negative B27 allele interpretation for all loci based on IMGT/HLA database version 3.44 This test was developed and its performance characteristics determined by [facility].  It has not been cleared or approved by the Food and Drug Administration. [facility] CLIA ID Number [id number] This test was performed using PCR (Polymerase Chain Reaction)/SSOP (Sequence Specific Oligonucleotide Probes) technique.  SBT (Sequence Based Typing) and/or SSP (Sequence Specific Primers) may be used as supplemental methods when necessary.  Please contact HLA Customer Service at [phone] if you have any questions. Director of [facility] Dr Brooks Sailors, PhD Performed At: [facility] [place], [place] [id number] Rush Farmer MD [id number] Performed At: [facility] [place], [place] [id number] Elvera Maria PhD WS:[id number]   Sedimentation rate     Status: Abnormal   Collection Time: 09/10/20  5:04 PM  Result Value Ref Range   Sed Rate 58 (H) 0 - 30 mm/hr    Comment: Performed at [facility], [address], [place], [place] [address]  Creatinine, serum     Status: None   Collection Time: 09/10/20  5:04 PM  Result Value Ref Range   Creatinine, Ser 0.93 0.44 - 1.00  mg/dL   GFR, Estimated >61 >00 mL/min    Comment: (NOTE) Calculated using the CKD-EPI Creatinine Equation (2021) Performed at [facility], [address], [place], [place] [address]   Uric acid     Status: None   Collection Time: 09/10/20  5:04 PM  Result Value Ref Range   Uric Acid, Serum 4.5 2.5 - 7.1 mg/dL    Comment: Performed at [facility], [address] [place], [place] [address]  C-reactive protein     Status: Abnormal   Collection Time: 09/10/20  5:04 PM  Result Value Ref Range   CRP 3.1 (H) <1.0 mg/dL    Comment: Performed at [facility], [address] [place], [place] [address]    Radiology No results found.  Assessment/Plan Essential (primary) hypertension blood pressure control important in reducing the progression of atherosclerotic disease. On appropriate oral medications.   Prediabetes blood glucose control important in reducing the progression of atherosclerotic disease. Also, involved in wound healing.  Swelling of limb She underwent a venous reflux study today which was negative for significant venous disease in the left lower extremity.  There was no DVT or superficial thrombophlebitis seen in the left leg.  There was no significant venous reflux identified.  A 3.2 cm Baker's cyst was noted in the left popliteal space.  The swelling is slightly better with more regular use of compression socks and elevation, but is still quite prominent her pain is still quite significant.  At this  point, the patient does not have venous disease requiring treatment.  She has at least stage II lymphedema with swelling and skin changes refractory to compression and elevation.  I would recommend a lymphedema pump be added as an adjuvant therapy to improve her symptoms of pain and swelling.  She will continue to get her evaluation for potential gout or other issues.  Anti-inflammatories seem to be helping some as well and she does have a Baker's cyst so there may be an inflammatory process going on some as well.  We will plan to see her back in 4 to 6 months and follow-up after she gets her lymphedema pump to assess her response to therapy.  She will do this once or twice daily for 30 to 60 minutes a day.  Lymphedema She underwent a venous reflux study today which was negative for significant venous disease in the left lower extremity.  There was no DVT or superficial thrombophlebitis seen in the left leg.  There was no significant venous reflux identified.  A 3.2 cm Baker's cyst was noted in the left popliteal space.  The swelling is slightly better with more regular use of compression socks and elevation, but is still quite prominent her pain is still quite significant.  At this point, the patient does not have venous disease requiring treatment.  She has at least stage II lymphedema with swelling and skin changes refractory to compression and elevation.  I would recommend a lymphedema pump be added as an adjuvant therapy to improve her symptoms of pain and swelling.  She will continue to get her evaluation for potential gout or other issues.  Anti-inflammatories seem to be helping some as well and she does have a Baker's cyst so there may be an inflammatory process going on some as well.  We will plan to see her back in 4 to 6 months and follow-up after she gets her lymphedema pump to assess her response to therapy.  She  will do this once or twice daily for 30 to 60 minutes a day.    Leotis Pain,  MD  09/24/2020 9:16 AM    This note was created with Dragon medical transcription system.  Any errors from dictation are purely unintentional

## 2020-09-24 NOTE — Patient Instructions (Signed)
Lymphedema  Lymphedema is swelling that is caused by the abnormal collection of lymph in the tissues under the skin. Lymph is excess fluid from the tissues in your body that is removed through the lymphatic system. This system is part of your body's defense system (immune system) and includes lymph nodes and lymph vessels. The lymph vessels collect and carry the excess fluid, fats, proteins, and waste from the tissues of the body to the bloodstream. This system also works to clean and remove bacteria and waste products from the body. Lymphedema occurs when the lymphatic system is blocked. When the lymph vessels or lymph nodes are blocked or damaged, lymph does not drain properly. This causes an abnormal buildup of lymph, which leads to swelling in the affected area. This may include the trunk area, or an arm or leg. Lymphedema cannot be cured by medicines, but various methods can be used to help reduce the swelling. What are the causes? The cause of this condition depends on the type of lymphedema that you have.  Primary lymphedema is caused by the absence of lymph vessels or having abnormal lymph vessels at birth.  Secondary lymphedema occurs when lymph vessels are blocked or damaged. Secondary lymphedema is more common. Common causes of lymph vessel blockage include: ? Skin infection, such as cellulitis. ? Infection by parasites (filariasis). ? Injury. ? Radiation therapy. ? Cancer. ? Formation of scar tissue. ? Surgery. What are the signs or symptoms? Symptoms of this condition include:  Swelling of the arm or leg.  A heavy or tight feeling in the arm or leg.  Swelling of the feet, toes, or fingers. Shoes or rings may fit more tightly than before.  Redness of the skin over the affected area.  Limited movement of the affected limb.  Sensitivity to touch or discomfort in the affected limb. How is this diagnosed? This condition may be diagnosed based on:  Your symptoms and medical  history.  A physical exam.  Bioimpedance spectroscopy. In this test, painless electrical currents are used to measure fluid levels in your body.  Imaging tests, such as: ? MRI. ? CT scan. ? Duplex ultrasound. This test uses sound waves to produce images of the vessels and the blood flow on a screen. ? Lymphoscintigraphy. In this test, a low dose of a radioactive substance is injected to trace the flow of lymph through your lymph vessels. ? Lymphangiography. In this test, a contrast dye is injected into the lymph vessel to help show blockages. How is this treated? If an underlying condition is causing the lymphedema, that condition will be treated. For example, antibiotic medicines may be used to treat an infection. Treatment for this condition will depend on the cause of your lymphedema. Treatment may include:  Complete decongestive therapy (CDT). This is done by a certified lymphedema therapist to reduce fluid congestion. This therapy includes: ? Skin care. ? Compression wrapping of the affected area. ? Manual lymph drainage. This is a special massage technique that promotes lymph drainage out of a limb. ? Specific exercises. Certain exercises can help fluid move out of the affected limb.  Compression. Various methods may be used to apply pressure to the affected limb to reduce the swelling. They include: ? Wearing compression stockings or sleeves on the affected limb. ? Wrapping the affected limb with special bandages.  Surgery. This is usually done for severe cases only. For example, surgery may be done if you have trouble moving the limb or if the swelling does not  get better with other treatments.   Follow these instructions at home: Self-care  The affected area is more likely to become injured or infected. Take these steps to help prevent infection: ? Keep the affected area clean and dry. ? Use approved creams or lotions to keep the skin moisturized. ? Protect your skin from  cuts:  Use gloves while cooking or gardening.  Do not walk barefoot.  If you shave the affected area, use an Copy.  Do not wear tight clothes, shoes, or jewelry.  Eat a healthy diet that includes a lot of fruits and vegetables. Activity  Do exercises as told by your health care provider.  Do not sit with your legs crossed.  When possible, keep the affected limb raised (elevated) above the level of your heart.  Avoid carrying things with an arm that is affected by lymphedema. General instructions  Wear compression stockings or sleeves as told by your health care provider.  Note any changes in size of the affected limb. You may be instructed to take regular measurements and keep track of them.  Take over-the-counter and prescription medicines only as told by your health care provider.  If you were prescribed an antibiotic medicine, take or apply it as told by your health care provider. Do not stop using the antibiotic even if you start to feel better or if your condition improves.  Do not use heating pads or ice packs on the affected area.  Avoid having blood draws, IV insertions, or blood pressure checks on the affected limb.  Keep all follow-up visits. This is important. Contact a health care provider if you:  Continue to have swelling in your limb.  Have fluid leaking from the skin of your swollen limb.  Have a cut that does not heal.  Have redness or pain in the affected area.  Develop purplish spots, rash, blisters, or sores (lesions) on your affected limb. Get help right away if you:  Have new swelling in your limb that starts suddenly.  Have shortness of breath or chest pain.  Have a fever or chills. These symptoms may represent a serious problem that is an emergency. Do not wait to see if the symptoms will go away. Get medical help right away. Call your local emergency services (911 in the [place] Do not drive yourself to the  hospital. Summary  Lymphedema is swelling that is caused by the abnormal collection of lymph in the tissues under the skin.  Lymph is fluid from the tissues in your body that is removed through the lymphatic system. This system collects and carries excess fluid, fats, proteins, and wastes from the tissues of the body to the bloodstream.  Lymphedema causes swelling, pain, and redness in the affected area. This may include the trunk area, or an arm or leg.  Treatment for this condition may depend on the cause of your lymphedema. Treatment may include treating the underlying cause, complete decongestive therapy (CDT), compression methods, or surgery. This information is not intended to replace advice given to you by your health care provider. Make sure you discuss any questions you have with your health care provider. Document Revised: 04/17/2020 Document Reviewed: 04/17/2020 Elsevier Patient Education  2021 [employer].

## 2020-09-30 DIAGNOSIS — M25472 Effusion, left ankle: Secondary | ICD-10-CM | POA: Diagnosis not present

## 2020-10-17 ENCOUNTER — Other Ambulatory Visit: Payer: Self-pay

## 2020-10-17 DIAGNOSIS — M19079 Primary osteoarthritis, unspecified ankle and foot: Secondary | ICD-10-CM | POA: Diagnosis not present

## 2020-10-17 DIAGNOSIS — R609 Edema, unspecified: Secondary | ICD-10-CM | POA: Diagnosis not present

## 2020-10-17 DIAGNOSIS — M1990 Unspecified osteoarthritis, unspecified site: Secondary | ICD-10-CM | POA: Diagnosis not present

## 2020-10-17 DIAGNOSIS — M1A00X0 Idiopathic chronic gout, unspecified site, without tophus (tophi): Secondary | ICD-10-CM | POA: Diagnosis not present

## 2020-10-17 DIAGNOSIS — M159 Polyosteoarthritis, unspecified: Secondary | ICD-10-CM | POA: Diagnosis not present

## 2020-10-17 MED FILL — Allopurinol Tab 100 MG: ORAL | 30 days supply | Qty: 30 | Fill #0 | Status: AC

## 2020-10-17 MED FILL — Meloxicam Tab 7.5 MG: ORAL | 30 days supply | Qty: 30 | Fill #0 | Status: AC

## 2020-11-18 ENCOUNTER — Other Ambulatory Visit: Payer: Self-pay

## 2020-11-18 ENCOUNTER — Encounter: Payer: Self-pay | Admitting: Family Medicine

## 2020-11-18 ENCOUNTER — Ambulatory Visit (INDEPENDENT_AMBULATORY_CARE_PROVIDER_SITE_OTHER): Payer: PPO | Admitting: Family Medicine

## 2020-11-18 VITALS — BP 166/84 | HR 76 | Temp 97.6°F | Resp 18 | Ht 61.0 in | Wt 217.0 lb

## 2020-11-18 DIAGNOSIS — R600 Localized edema: Secondary | ICD-10-CM

## 2020-11-18 DIAGNOSIS — E559 Vitamin D deficiency, unspecified: Secondary | ICD-10-CM

## 2020-11-18 DIAGNOSIS — I10 Essential (primary) hypertension: Secondary | ICD-10-CM | POA: Diagnosis not present

## 2020-11-18 DIAGNOSIS — E119 Type 2 diabetes mellitus without complications: Secondary | ICD-10-CM

## 2020-11-18 DIAGNOSIS — Z720 Tobacco use: Secondary | ICD-10-CM | POA: Diagnosis not present

## 2020-11-18 DIAGNOSIS — R310 Gross hematuria: Secondary | ICD-10-CM | POA: Diagnosis not present

## 2020-11-18 DIAGNOSIS — Z6841 Body Mass Index (BMI) 40.0 and over, adult: Secondary | ICD-10-CM

## 2020-11-18 DIAGNOSIS — N390 Urinary tract infection, site not specified: Secondary | ICD-10-CM | POA: Diagnosis not present

## 2020-11-18 MED ORDER — SPIRONOLACTONE 25 MG PO TABS
25.0000 mg | ORAL_TABLET | Freq: Every day | ORAL | 1 refills | Status: DC
  Filled 2020-11-18: qty 90, 90d supply, fill #0

## 2020-11-18 MED ORDER — METOPROLOL SUCCINATE ER 50 MG PO TB24
ORAL_TABLET | ORAL | 1 refills | Status: DC
  Filled 2020-11-18: qty 90, 90d supply, fill #0

## 2020-11-18 MED ORDER — MELOXICAM 7.5 MG PO TABS
ORAL_TABLET | ORAL | 1 refills | Status: AC
  Filled 2020-11-18: qty 30, 30d supply, fill #0
  Filled 2020-12-30: qty 30, 30d supply, fill #1

## 2020-11-18 MED ORDER — LORAZEPAM 0.5 MG PO TABS
ORAL_TABLET | Freq: Two times a day (BID) | ORAL | 2 refills | Status: DC | PRN
  Filled 2020-11-18: qty 60, fill #0
  Filled 2020-11-19: qty 60, 30d supply, fill #0

## 2020-11-18 MED ORDER — ERGOCALCIFEROL 1.25 MG (50000 UT) PO CAPS
50000.0000 [IU] | ORAL_CAPSULE | ORAL | 1 refills | Status: AC
  Filled 2020-11-18: qty 12, 84d supply, fill #0

## 2020-11-18 MED FILL — Lorazepam Tab 0.5 MG: ORAL | 60 days supply | Qty: 60 | Fill #0 | Status: CN

## 2020-11-18 MED FILL — Allopurinol Tab 100 MG: ORAL | 30 days supply | Qty: 30 | Fill #0 | Status: AC

## 2020-11-18 NOTE — Progress Notes (Signed)
I,[name],acting as a scribe for Ruth Durie, MD.,have documented all relevant documentation on the behalf of Ruth Durie, MD,as directed by  Ruth Durie, MD while in the presence of Ruth Durie, MD.  Established patient visit   Patient: Ruth Higgins   DOB: 1955-09-14   65 y.o. Female  MRN: [id number] Visit Date: 11/18/2020  Today's healthcare provider: [name], MD   Chief Complaint  Patient presents with  . Follow-up  . Prediabetes  . Hypertension   Subjective    HPI  Patient comes in today for follow-up.  She is seeing Dr. Jefm Bryant from rheumatology and Dr. Lucky Cowboy from vascular.  She is starting a lymphedema pump twice a day.  She does have a Baker's cyst behind her left knee which makes the edema worse I am sure. Patient continues to smoke. In review of systems she states she has been "peeing blood since July".  She says it is better recently. No pain fevers or any systemic symptoms. Hypertension, follow-up  BP Readings from Last 3 Encounters:  11/18/20 (!) 166/84  09/24/20 (!) 173/114  09/03/20 (!) 165/112   Wt Readings from Last 3 Encounters:  11/18/20 217 lb (98.4 kg)  09/24/20 213 lb (96.6 kg)  09/03/20 216 lb (98 kg)     She was last seen for hypertension 6 months ago.  BP at that visit was 146/72. Management since that visit includes; on lisinopril and metoprolol. She reports good compliance with treatment. She is not having side effects. none She is not exercising. She is not adherent to low salt diet.   Outside blood pressures are normal.  She does smoke.  Use of agents associated with hypertension: none.   --------------------------------------------------------------------  Prediabetes, Follow-up  Lab Results  Component Value Date   HGBA1C 6.5 (H) 08/02/2020   HGBA1C 6.3 (H) 11/08/2019   HGBA1C 6.2 (A) 07/12/2019   GLUCOSE 99 08/02/2020   GLUCOSE 101 (H) 01/10/2019   GLUCOSE 99 11/22/2018     Last seen for for this 6 months ago.  Management since that visit includes; no changes were made. Current symptoms include none and have been unchanged.  Prior visit with dietician: no Current diet: well balanced Current exercise: none  Pertinent Labs:    Component Value Date/Time   CHOL 178 11/08/2019 0858   TRIG 128 11/08/2019 0858   CHOLHDL 5.6 (H) 11/08/2019 0858   CHOLHDL 6.1 01/19/2018 0831   CREATININE 0.93 09/10/2020 1704   CREATININE 0.96 02/25/2013 1746    Wt Readings from Last 3 Encounters:  11/18/20 217 lb (98.4 kg)  09/24/20 213 lb (96.6 kg)  09/03/20 216 lb (98 kg)    --------------------------------------------------------------------       Medications: Outpatient Medications Prior to Visit  Medication Sig  . allopurinol (ZYLOPRIM) 100 MG tablet TAKE 1 TABLET (100 MG TOTAL) BY MOUTH ONCE DAILY  . fluticasone (FLONASE) 50 MCG/ACT nasal spray Place 2 sprays into both nostrils daily.  . meloxicam (MOBIC) 7.5 MG tablet Take 7.5 mg by mouth daily.  . metoprolol succinate (TOPROL-XL) 50 MG 24 hr tablet Take 50 mg by mouth daily. Take with or immediately following a meal.  . [DISCONTINUED] ergocalciferol (VITAMIN D2) 1.25 MG (50000 UT) capsule Take 50,000 Units by mouth once a week.  . [DISCONTINUED] lisinopril (ZESTRIL) 40 MG tablet TAKE 1 TABLET (40 MG TOTAL) BY MOUTH DAILY.  . [DISCONTINUED] LORazepam (ATIVAN) 0.5 MG tablet TAKE 1 TABLET BY MOUTH TWICE DAILY  AS NEEDED.  . [DISCONTINUED] meloxicam (MOBIC) 7.5 MG tablet TAKE 1 TABLET (7.5 MG TOTAL) BY MOUTH ONCE DAILY  . [DISCONTINUED] metoprolol succinate (TOPROL-XL) 50 MG 24 hr tablet TAKE 1 TABLET BY MOUTH ONCE DAILY. TAKE WITH OR IMMEDIATELY FOLLOWING A MEAL.  Marland Kitchen enoxaparin (LOVENOX) 40 MG/0.4ML injection Inject 0.4 mLs (40 mg total) into the skin daily for 14 days. (Patient not taking: Reported on 11/18/2020)  . methylPREDNISolone (MEDROL DOSEPAK) 4 MG TBPK tablet TAKE 6 TABS BY MOUTH ON DAY 1; 5 TABS ON  DAY 2; 4 TABS ON DAY 3; 3 TABS ON DAY 4; 2 TABS ON DAY 5; 1 TAB ON DAY 6 THEN STOP (Patient not taking: Reported on 11/18/2020)  . nicotine (NICODERM CQ - DOSED IN MG/24 HOURS) 21 mg/24hr patch Place 21 mg onto the skin daily as needed (smoke cessation). (Patient not taking: Reported on 11/18/2020)  . Pseudoephedrine-Ibuprofen 30-200 MG TABS Take 1 tablet by mouth every 4 (four) hours as needed. (Patient not taking: Reported on 11/18/2020)  . traMADol (ULTRAM) 50 MG tablet Take 1-2 tablets (50-100 mg total) by mouth every 6 (six) hours as needed for moderate pain. (Patient not taking: Reported on 11/18/2020)  . [DISCONTINUED] allopurinol (ZYLOPRIM) 100 MG tablet Take 100 mg by mouth daily.  . [DISCONTINUED] allopurinol (ZYLOPRIM) 100 MG tablet TAKE 1 TABLET (100 MG TOTAL) BY MOUTH ONCE DAILY  . [DISCONTINUED] allopurinol (ZYLOPRIM) 100 MG tablet TAKE 1/2 TABLET (50 MG) BY MOUTH ONCE DAILY  . [DISCONTINUED] lisinopril (ZESTRIL) 40 MG tablet Take 1 tablet (40 mg total) by mouth daily.  . [DISCONTINUED] meloxicam (MOBIC) 7.5 MG tablet Take by mouth.  . [DISCONTINUED] metoprolol succinate (TOPROL-XL) 50 MG 24 hr tablet TAKE 1 TABLET BY MOUTH ONCE DAILY. TAKE WITH OR IMMEDIATELY FOLLOWING A MEAL.  . [DISCONTINUED] spironolactone (ALDACTONE) 25 MG tablet Take 1 tablet (25 mg total) by mouth daily. (Patient not taking: Reported on 11/18/2020)   No facility-administered medications prior to visit.    Review of Systems  Constitutional: Negative for appetite change, chills, fatigue and fever.  Respiratory: Negative for chest tightness and shortness of breath.   Cardiovascular: Negative for chest pain and palpitations.  Gastrointestinal: Negative for abdominal pain, nausea and vomiting.  Neurological: Negative for dizziness and weakness.        Objective    BP (!) 166/84 (BP Location: Left Arm, Patient Position: Sitting, Cuff Size: Large)   Pulse 76   Temp 97.6 F (36.4 C) (Oral)   Resp 18   Ht 5\' 1"   (1.549 m)   Wt 217 lb (98.4 kg)   SpO2 97%   BMI 41.00 kg/m  BP Readings from Last 3 Encounters:  11/18/20 (!) 166/84  09/24/20 (!) 173/114  09/03/20 (!) 165/112   Wt Readings from Last 3 Encounters:  11/18/20 217 lb (98.4 kg)  09/24/20 213 lb (96.6 kg)  09/03/20 216 lb (98 kg)       Physical Exam Vitals reviewed.  Constitutional:      Appearance: She is obese.  HENT:     Head: Normocephalic and atraumatic.     Right Ear: External ear normal.     Left Ear: External ear normal.     Nose: Nose normal.  Eyes:     General: No scleral icterus.    Conjunctiva/sclera: Conjunctivae normal.  Cardiovascular:     Rate and Rhythm: Normal rate and regular rhythm.     Heart sounds: Normal heart sounds.  Pulmonary:     Effort:  Pulmonary effort is normal.     Breath sounds: Normal breath sounds.  Abdominal:     Palpations: Abdomen is soft.  Musculoskeletal:     Comments: Trace edema on the right.  1+ edema on the left.   Lymphadenopathy:     Cervical: No cervical adenopathy.  Skin:    General: Skin is warm and dry.  Neurological:     Mental Status: She is alert and oriented to person, place, and time. Mental status is at baseline.  Psychiatric:        Mood and Affect: Mood normal.        Behavior: Behavior normal.        Thought Content: Thought content normal.       No results found for any visits on 11/18/20.  Assessment & Plan      1. Essential (primary) hypertension Good control. - metoprolol succinate (TOPROL-XL) 50 MG 24 hr tablet; Take with or immediately following a meal.  Dispense: 90 tablet; Refill: 1  2. Edema of extremities Patient has Baker's cyst of her knee but I do think her edema would be improved with weight loss and use of lymphedema pump in addition to support - spironolactone (ALDACTONE) 25 MG tablet; Take 1 tablet (25 mg total) by mouth daily.  Dispense: 90 tablet; Refill: 1  3. Vitamin D deficiency Check vitamin D level later in the year -  ergocalciferol (VITAMIN D2) 1.25 MG (50000 UT) capsule; Take 1 capsule (50,000 Units total) by mouth once a week.  Dispense: 12 capsule; Refill: 1  4. Chronic UTI (urinary tract infection)/gross hematuria Patient describing gross hematuria intermittently for the last 10 months.  This smoker she needs to see urologist. - Ambulatory referral to Urology  5. Class 3 severe obesity with serious comorbidity and body mass index (BMI) of 40.0 to 44.9 in adult, unspecified obesity type (HCC) Diet and exercise discussed  6. Type 2 diabetes mellitus without complication, without long-term current use of insulin (HCC) A1c this summer  7. Current tobacco use Patient strongly advised to quit smoking   Return in about 3 months (around 02/18/2021).      I, Ruth Durie, MD, have reviewed all documentation for this visit. The documentation on 11/19/20 for the exam, diagnosis, procedures, and orders are all accurate and complete.    Shivansh Hardaway Cranford Mon, MD  [facility] [phone] (phone) [phone] (fax)  [place]

## 2020-11-19 ENCOUNTER — Other Ambulatory Visit: Payer: Self-pay | Admitting: Family Medicine

## 2020-11-19 ENCOUNTER — Other Ambulatory Visit: Payer: Self-pay

## 2020-11-19 DIAGNOSIS — I10 Essential (primary) hypertension: Secondary | ICD-10-CM

## 2020-11-19 MED FILL — Lisinopril Tab 40 MG: ORAL | 90 days supply | Qty: 90 | Fill #0 | Status: AC

## 2020-11-19 NOTE — Telephone Encounter (Signed)
Requested medication (s) are due for refill today: No  Requested medication (s) are on the active medication list: No  Last refill:  Unknown  Future visit scheduled: Yes  Notes to clinic:  Medication not on list.    Requested Prescriptions  Pending Prescriptions Disp Refills   lisinopril (ZESTRIL) 40 MG tablet 90 tablet 0    Sig: Take 1 tablet (40 mg total) by mouth daily.      Cardiovascular:  ACE Inhibitors Failed - 11/19/2020  3:25 PM      Failed - Last BP in normal range    BP Readings from Last 1 Encounters:  11/18/20 (!) 166/84          Passed - Cr in normal range and within 180 days    Creatinine  Date Value Ref Range Status  02/25/2013 0.96 0.60 - 1.30 mg/dL Final   Creatinine, Ser  Date Value Ref Range Status  09/10/2020 0.93 0.44 - 1.00 mg/dL Final          Passed - K in normal range and within 180 days    Potassium  Date Value Ref Range Status  08/02/2020 3.7 3.5 - 5.1 mmol/L Final  02/25/2013 3.5 3.5 - 5.1 mmol/L Final          Passed - Patient is not pregnant      Passed - Valid encounter within last 6 months    Recent Outpatient Visits           Yesterday Essential (primary) hypertension   [facility] Jerrol Banana., MD   6 months ago Type 2 diabetes mellitus without complication, without long-term current use of insulin [facility])   [facility] Jerrol Banana., MD   10 months ago Annual physical exam   [facility] [place], Fabio Bering M, [place]   1 year ago Type 2 diabetes mellitus without complication, without long-term current use of insulin [facility])   [facility] Jerrol Banana., MD   1 year ago Type 2 diabetes mellitus without complication, without long-term current use of insulin [facility])   [facility] Jerrol Banana., MD       Future Appointments             In 3 months Jerrol Banana., MD [facility]

## 2020-11-22 ENCOUNTER — Other Ambulatory Visit: Payer: Self-pay

## 2020-11-25 ENCOUNTER — Telehealth (INDEPENDENT_AMBULATORY_CARE_PROVIDER_SITE_OTHER): Payer: Self-pay | Admitting: Vascular Surgery

## 2020-11-26 NOTE — Telephone Encounter (Signed)
Spoke with pt today - she states she called [employer] yesterday and they are waiting on a letter of necessity from [place]. Can someone please advise if we have this and can send it in. Thanks!

## 2020-11-26 NOTE — Telephone Encounter (Signed)
She should contact  [facility] at [place]

## 2020-11-26 NOTE — Telephone Encounter (Signed)
This has been taken care of and sent to [place].

## 2020-12-03 DIAGNOSIS — I890 Lymphedema, not elsewhere classified: Secondary | ICD-10-CM | POA: Diagnosis not present

## 2020-12-30 ENCOUNTER — Other Ambulatory Visit: Payer: Self-pay

## 2020-12-30 MED ORDER — ALLOPURINOL 100 MG PO TABS
ORAL_TABLET | ORAL | 2 refills | Status: DC
  Filled 2020-12-30: qty 30, 30d supply, fill #0
  Filled 2021-01-23: qty 30, 30d supply, fill #1
  Filled 2021-02-19: qty 30, 30d supply, fill #2

## 2020-12-31 ENCOUNTER — Other Ambulatory Visit: Payer: Self-pay

## 2021-01-23 ENCOUNTER — Other Ambulatory Visit: Payer: Self-pay

## 2021-01-24 ENCOUNTER — Other Ambulatory Visit: Payer: Self-pay | Admitting: *Deleted

## 2021-01-24 DIAGNOSIS — F17200 Nicotine dependence, unspecified, uncomplicated: Secondary | ICD-10-CM

## 2021-01-24 DIAGNOSIS — Z87891 Personal history of nicotine dependence: Secondary | ICD-10-CM

## 2021-01-28 ENCOUNTER — Ambulatory Visit (INDEPENDENT_AMBULATORY_CARE_PROVIDER_SITE_OTHER): Payer: PPO | Admitting: Vascular Surgery

## 2021-01-28 ENCOUNTER — Other Ambulatory Visit: Payer: Self-pay

## 2021-01-28 ENCOUNTER — Encounter (INDEPENDENT_AMBULATORY_CARE_PROVIDER_SITE_OTHER): Payer: Self-pay | Admitting: Vascular Surgery

## 2021-01-28 VITALS — BP 181/113 | HR 75 | Resp 16 | Wt 216.7 lb

## 2021-01-28 DIAGNOSIS — E785 Hyperlipidemia, unspecified: Secondary | ICD-10-CM | POA: Diagnosis not present

## 2021-01-28 DIAGNOSIS — I890 Lymphedema, not elsewhere classified: Secondary | ICD-10-CM

## 2021-01-28 DIAGNOSIS — I10 Essential (primary) hypertension: Secondary | ICD-10-CM | POA: Diagnosis not present

## 2021-01-28 NOTE — Assessment & Plan Note (Signed)
Her symptoms really are not a lot better, but she just got her lymphedema pump in the last couple of weeks.  She has been using it but it has caused more foot pain and we discussed turning the pressure down a bit on this.  Her symptoms are not really worse, but they are not much better.  She also has a lot of life stresses with caring for an ailing mother and still having to work frequently despite having retired.  She is getting called in several times a week to help with shifts.  At this point, I do not have a lot more to offer but we will plan to see her back in 6 months for follow-up or sooner if significant problems develop in the interim.

## 2021-01-28 NOTE — Progress Notes (Signed)
MRN : [id number]  Ruth Higgins is a 65 y.o. ([birth year]) female who presents with chief complaint of  Chief Complaint  Patient presents with   Follow-up    4 month follow up  .  History of Present Illness: Patient returns today in follow up of her lymphedema.  She is doing well today without any new complaints although her symptoms are really not that much improved.  She just got a lymphedema pump a couple of weeks ago.  The pressure appears to be too high for her at this point as it is causing a lot of foot pain.  Her foot pain is really her primary problem although the swelling is exacerbating this.  Her symptoms are almost all in the left leg.  No new ulcerations or infection.  No fevers or chills  Current Outpatient Medications  Medication Sig Dispense Refill   allopurinol (ZYLOPRIM) 100 MG tablet TAKE 1 TABLET (100 MG TOTAL) BY MOUTH ONCE DAILY 30 tablet 2   allopurinol (ZYLOPRIM) 100 MG tablet TAKE 1 TABLET (100 MG TOTAL) BY MOUTH ONCE DAILY 30 tablet 1   allopurinol (ZYLOPRIM) 100 MG tablet Take 1 tablet (100 mg total) by mouth once daily 30 tablet 2   ergocalciferol (VITAMIN D2) 1.25 MG (50000 UT) capsule Take 1 capsule (50,000 Units total) by mouth once a week. 12 capsule 1   fluticasone (FLONASE) 50 MCG/ACT nasal spray Place 2 sprays into both nostrils daily. 18.2 mL 2   lisinopril (ZESTRIL) 40 MG tablet Take 1 tablet (40 mg total) by mouth daily. 90 tablet 0   LORazepam (ATIVAN) 0.5 MG tablet TAKE 1 TABLET BY MOUTH TWICE DAILY AS NEEDED. 60 tablet 2   meloxicam (MOBIC) 7.5 MG tablet TAKE 1 TABLET (7.5 MG TOTAL) BY MOUTH ONCE DAILY 30 tablet 1   meloxicam (MOBIC) 7.5 MG tablet Take 7.5 mg by mouth daily.     metoprolol succinate (TOPROL-XL) 50 MG 24 hr tablet Take 50 mg by mouth daily. Take with or immediately following a meal.     metoprolol succinate (TOPROL-XL) 50 MG 24 hr tablet Take with or immediately following a meal. 90 tablet 1   spironolactone (ALDACTONE) 25 MG  tablet Take 1 tablet (25 mg total) by mouth daily. 90 tablet 1   enoxaparin (LOVENOX) 40 MG/0.4ML injection Inject 0.4 mLs (40 mg total) into the skin daily for 14 days. (Patient not taking: Reported on 11/18/2020) 14 Syringe 0   methylPREDNISolone (MEDROL DOSEPAK) 4 MG TBPK tablet TAKE 6 TABS BY MOUTH ON DAY 1; 5 TABS ON DAY 2; 4 TABS ON DAY 3; 3 TABS ON DAY 4; 2 TABS ON DAY 5; 1 TAB ON DAY 6 THEN STOP (Patient not taking: No sig reported) 21 each 0   nicotine (NICODERM CQ - DOSED IN MG/24 HOURS) 21 mg/24hr patch Place 21 mg onto the skin daily as needed (smoke cessation). (Patient not taking: No sig reported)     Pseudoephedrine-Ibuprofen 30-200 MG TABS Take 1 tablet by mouth every 4 (four) hours as needed. (Patient not taking: No sig reported)     traMADol (ULTRAM) 50 MG tablet Take 1-2 tablets (50-100 mg total) by mouth every 6 (six) hours as needed for moderate pain. (Patient not taking: No sig reported) 60 tablet 0   No current facility-administered medications for this visit.    Past Medical History:  Diagnosis Date   Arthritis    Family history of adverse reaction to anesthesia    brother hard  to wake up after surgery   Hypertension    PONV (postoperative nausea and vomiting) 1980s   Pre-diabetes     Past Surgical History:  Procedure Laterality Date   ABDOMINAL HYSTERECTOMY  2014   Dr. Sabra Heck   APPENDECTOMY     HERNIA REPAIR  2014   Dr. Pat Patrick   KNEE ARTHROPLASTY Right 01/31/2018   Procedure: COMPUTER ASSISTED TOTAL KNEE ARTHROPLASTY;  Surgeon: Dereck Leep, MD;  Location: [facility] ORS;  Service: Orthopedics;  Laterality: Right;   KNEE ARTHROPLASTY Left 07/04/2018   Procedure: COMPUTER ASSISTED TOTAL KNEE ARTHROPLASTY;  Surgeon: Dereck Leep, MD;  Location: [facility] ORS;  Service: Orthopedics;  Laterality: Left;   KNEE SURGERY Bilateral 2007   LIPOMA EXCISION  1999   of the left side of the neck   NECK SURGERY     SALPINGOOPHORECTOMY Bilateral    TONSILLECTOMY     TUBAL LIGATION        Social History   Tobacco Use   Smoking status: Every Day    Packs/day: 1.00    Years: 48.00    Pack years: 48.00    Types: Cigarettes   Smokeless tobacco: Never   Tobacco comments:    smokes due to stress; has quit several times  Vaping Use   Vaping Use: Never used  Substance Use Topics   Alcohol use: No    Alcohol/week: 0.0 standard drinks   Drug use: No      Family History  Problem Relation Age of Onset   Alzheimer's disease Father    Heart disease Father    Hypothyroidism Sister    Hypertension Brother    Polycystic kidney disease Brother    Depression Sister    Hypertension Sister    Anxiety disorder Sister    Stroke Mother    Rectal cancer Brother      Allergies  Allergen Reactions   Ceclor [Cefaclor] Anaphylaxis and Hives   Demerol [Meperidine] Nausea And Vomiting   Erythromycin     EES caused jaundice like symptoms    Flagyl [Metronidazole] Swelling   Hydrochlorothiazide     severe cramping in her legs/knee/calves   Hydrocodone-Acetaminophen Nausea And Vomiting   Morphine And Related     Pt prefers to not be given morphine, tolerates hydromorphone instead    Sulfa Antibiotics Swelling   Penicillins Hives and Rash    Has patient had a PCN reaction causing immediate rash, facial/tongue/throat swelling, SOB or lightheadedness with hypotension: Yes Has patient had a PCN reaction causing severe rash involving mucus membranes or skin necrosis: Yes Has patient had a PCN reaction that required hospitalization: Yes Has patient had a PCN reaction occurring within the last 10 years: No If all of the above answers are "NO", then may proceed with Cephalosporin use.    Spironolactone Rash    pt reported    REVIEW OF SYSTEMS (Negative unless checked)   Constitutional: '[]'$ Weight loss  '[]'$ Fever  '[]'$ Chills Cardiac: '[]'$ Chest pain   '[]'$ Chest pressure   '[]'$ Palpitations   '[]'$ Shortness of breath when laying flat   '[]'$ Shortness of breath at rest   '[]'$ Shortness of breath  with exertion. Vascular:  '[x]'$ Pain in legs with walking   '[x]'$ Pain in legs at rest   '[]'$ Pain in legs when laying flat   '[]'$ Claudication   '[]'$ Pain in feet when walking  '[]'$ Pain in feet at rest  '[]'$ Pain in feet when laying flat   '[]'$ History of DVT   '[]'$ Phlebitis   '[x]'$ Swelling in legs   '[]'$ Varicose  veins   '[]'$ Non-healing ulcers Pulmonary:   '[]'$ Uses home oxygen   '[]'$ Productive cough   '[]'$ Hemoptysis   '[]'$ Wheeze  '[]'$ COPD   '[]'$ Asthma Neurologic:  '[]'$ Dizziness  '[]'$ Blackouts   '[]'$ Seizures   '[]'$ History of stroke   '[]'$ History of TIA  '[]'$ Aphasia   '[]'$ Temporary blindness   '[]'$ Dysphagia   '[]'$ Weakness or numbness in arms   '[]'$ Weakness or numbness in legs Musculoskeletal:  '[x]'$ Arthritis   '[]'$ Joint swelling   '[x]'$ Joint pain   '[]'$ Low back pain Hematologic:  '[]'$ Easy bruising  '[]'$ Easy bleeding   '[]'$ Hypercoagulable state   '[]'$ Anemic  '[]'$ Hepatitis Gastrointestinal:  '[]'$ Blood in stool   '[]'$ Vomiting blood  '[x]'$ Gastroesophageal reflux/heartburn   '[]'$ Abdominal pain Genitourinary:  '[]'$ Chronic kidney disease   '[]'$ Difficult urination  '[]'$ Frequent urination  '[]'$ Burning with urination   '[]'$ Hematuria Skin:  '[]'$ Rashes   '[]'$ Ulcers   '[]'$ Wounds Psychological:  '[x]'$ History of anxiety   '[]'$  History of major depression.  Physical Examination  BP (!) 181/113 (BP Location: Right Arm)   Pulse 75   Resp 16   Wt 216 lb 11.2 oz (98.3 kg)   BMI 40.95 kg/m  Gen:  WD/WN, NAD Head: [place]/AT, No temporalis wasting. Ear/Nose/Throat: Hearing grossly intact, nares w/o erythema or drainage Eyes: Conjunctiva clear. Sclera non-icteric Neck: Supple.  Trachea midline Pulmonary:  Good air movement, no use of accessory muscles.  Cardiac: RRR, no JVD Vascular:  Vessel Right Left  Radial Palpable Palpable           Musculoskeletal: M/S 5/5 throughout.  No deformity or atrophy.  Trace to 1+ right lower extremity edema, 2+ left lower extremity edema. Neurologic: Sensation grossly intact in extremities.  Symmetrical.  Speech is fluent.  Psychiatric: Judgment intact, Mood & affect appropriate for  pt's clinical situation. Dermatologic: No rashes or ulcers noted.  No cellulitis or open wounds.      Labs No results found for this or any previous visit (from the past 2160 hour(s)).  Radiology No results found.  Assessment/Plan Essential (primary) hypertension blood pressure control important in reducing the progression of atherosclerotic disease. On appropriate oral medications.     Prediabetes blood glucose control important in reducing the progression of atherosclerotic disease. Also, involved in wound healing.  No problem-specific Assessment & Plan notes found for this encounter.    Leotis Pain, MD  01/28/2021 10:29 AM    This note was created with Dragon medical transcription system.  Any errors from dictation are purely unintentional

## 2021-01-30 ENCOUNTER — Ambulatory Visit
Admission: RE | Admit: 2021-01-30 | Discharge: 2021-01-30 | Disposition: A | Discharge status: Home / Self Care | Payer: PPO | Source: Ambulatory Visit | Attending: Acute Care | Admitting: Acute Care

## 2021-01-30 ENCOUNTER — Other Ambulatory Visit: Payer: Self-pay

## 2021-01-30 DIAGNOSIS — Z87891 Personal history of nicotine dependence: Secondary | ICD-10-CM | POA: Diagnosis not present

## 2021-01-30 DIAGNOSIS — F17210 Nicotine dependence, cigarettes, uncomplicated: Secondary | ICD-10-CM | POA: Diagnosis not present

## 2021-01-30 DIAGNOSIS — F17200 Nicotine dependence, unspecified, uncomplicated: Secondary | ICD-10-CM | POA: Insufficient documentation

## 2021-02-03 ENCOUNTER — Encounter: Payer: Self-pay | Admitting: *Deleted

## 2021-02-03 ENCOUNTER — Telehealth: Payer: Self-pay | Admitting: Acute Care

## 2021-02-03 DIAGNOSIS — Z87891 Personal history of nicotine dependence: Secondary | ICD-10-CM

## 2021-02-03 DIAGNOSIS — F17200 Nicotine dependence, unspecified, uncomplicated: Secondary | ICD-10-CM

## 2021-02-03 NOTE — Telephone Encounter (Signed)
CT result letter sent to pt via Mychart. Copy of CT faxed to PCP. Order placed for 1 yr f/u low dose ct.   

## 2021-02-13 NOTE — Progress Notes (Signed)
Please call patient and let them  know their  low dose Ct was read as a Lung RADS 2: nodules that are benign in appearance and behavior with a very low likelihood of becoming a clinically active cancer due to size or lack of growth. Recommendation per radiology is for a repeat LDCT in 12 months. .Please let them  know we will order and schedule their  annual screening scan for 01/2022. Please let them  know there was notation of CAD on their  scan.  Please remind the patient  that this is a non-gated exam therefore degree or severity of disease  cannot be determined. Please have them  follow up with their PCP regarding potential risk factor modification, dietary therapy or pharmacologic therapy if clinically indicated. Pt.  is not  currently on statin therapy. Please place order for annual  screening scan for  01/2022 and fax results to PCP. Thanks so much.  Langley Gauss, there was notation of  Emphysema and aortic atherosclerosis. She is not on a statin, and I do not see that she has seen cardiology. Please have her follow up with her PCP. Thanks

## 2021-02-18 ENCOUNTER — Other Ambulatory Visit: Payer: Self-pay

## 2021-02-18 ENCOUNTER — Ambulatory Visit (INDEPENDENT_AMBULATORY_CARE_PROVIDER_SITE_OTHER): Payer: PPO | Admitting: Family Medicine

## 2021-02-18 ENCOUNTER — Encounter: Payer: Self-pay | Admitting: Family Medicine

## 2021-02-18 VITALS — BP 162/66 | HR 62 | Temp 98.6°F | Resp 16 | Ht 61.0 in | Wt 218.0 lb

## 2021-02-18 DIAGNOSIS — I10 Essential (primary) hypertension: Secondary | ICD-10-CM

## 2021-02-18 DIAGNOSIS — R7303 Prediabetes: Secondary | ICD-10-CM

## 2021-02-18 DIAGNOSIS — R739 Hyperglycemia, unspecified: Secondary | ICD-10-CM

## 2021-02-18 DIAGNOSIS — Z720 Tobacco use: Secondary | ICD-10-CM

## 2021-02-18 DIAGNOSIS — E785 Hyperlipidemia, unspecified: Secondary | ICD-10-CM

## 2021-02-18 DIAGNOSIS — M1712 Unilateral primary osteoarthritis, left knee: Secondary | ICD-10-CM

## 2021-02-18 DIAGNOSIS — Z96659 Presence of unspecified artificial knee joint: Secondary | ICD-10-CM | POA: Diagnosis not present

## 2021-02-18 DIAGNOSIS — Z87891 Personal history of nicotine dependence: Secondary | ICD-10-CM | POA: Diagnosis not present

## 2021-02-18 MED ORDER — LISINOPRIL 40 MG PO TABS
ORAL_TABLET | ORAL | 0 refills | Status: DC
  Filled 2021-02-18: qty 90, 90d supply, fill #0

## 2021-02-18 MED ORDER — HYDRALAZINE HCL 25 MG PO TABS
25.0000 mg | ORAL_TABLET | Freq: Two times a day (BID) | ORAL | 11 refills | Status: AC
  Filled 2021-02-18: qty 60, 30d supply, fill #0

## 2021-02-18 MED ORDER — METOPROLOL SUCCINATE ER 50 MG PO TB24
ORAL_TABLET | ORAL | 1 refills | Status: DC
  Filled 2021-02-18: qty 90, 90d supply, fill #0

## 2021-02-18 NOTE — Progress Notes (Signed)
Established patient visit   Patient: Ruth Higgins   DOB: 09-20-1955   65 y.o. Female  MRN: [id number] Visit Date: 02/18/2021  Today's healthcare provider: [name], MD   No chief complaint on file.  Subjective  -------------------------------------------------------------------------------------------------------------------- HPI  Patient on lisinopril and Toprol for high blood pressure.  Spironolactone evidently caused her rash and HCTZ caused her to have fatigue. 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 She has chronic knee pain followed by orthopedics. 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 Hypertension, follow-up  BP Readings from Last 3 Encounters:  02/18/21 (!) 162/66  01/28/21 (!) 181/113  11/18/20 (!) 166/84   Wt Readings from Last 3 Encounters:  02/18/21 218 lb (98.9 kg)  01/30/21 215 lb (97.5 kg)  01/28/21 216 lb 11.2 oz (98.3 kg)     She was last seen for hypertension 3 months ago.  BP at that visit was 166/84. Management since that visit includes no medication changes.  She reports good compliance with treatment. She is not having side effects.  She is following a Regular diet. She is not exercising. She does smoke.  Use of agents associated with hypertension: none.   Outside blood pressures are checked occasionally. Symptoms: No chest pain No chest pressure  No palpitations No syncope  No dyspnea No orthopnea  No paroxysmal nocturnal dyspnea No lower extremity edema   Pertinent labs: Lab Results  Component Value Date   CHOL 178 11/08/2019   HDL 32 (L) 11/08/2019   LDLCALC 123 (H) 11/08/2019   TRIG 128 11/08/2019   CHOLHDL 5.6 (H) 11/08/2019   Lab Results  Component Value Date   NA 136 08/02/2020   K 3.7 08/02/2020   CREATININE 0.93 09/10/2020   GFRNONAA >60 09/10/2020   GFRAA 70 01/10/2019   GLUCOSE 99 08/02/2020     The 10-year ASCVD risk score Mikey Bussing DC  Jr., et al., 2013) is: 41.4%   Prediabetes, Follow-up  Lab Results  Component Value Date   HGBA1C 6.5 (H) 08/02/2020   HGBA1C 6.3 (H) 11/08/2019   HGBA1C 6.2 (A) 07/12/2019   GLUCOSE 99 08/02/2020   GLUCOSE 101 (H) 01/10/2019   GLUCOSE 99 11/22/2018    Last seen for for this3 months ago.  Management since that visit includes no medication changes. Current symptoms include none and have been stable.  Prior visit with dietician: no Current diet: well balanced Current exercise: none  Pertinent Labs:    Component Value Date/Time   CHOL 178 11/08/2019 0858   TRIG 128 11/08/2019 0858   CHOLHDL 5.6 (H) 11/08/2019 0858   CHOLHDL 6.1 01/19/2018 0831   CREATININE 0.93 09/10/2020 1704   CREATININE 0.96 02/25/2013 1746    Wt Readings from Last 3 Encounters:  02/18/21 218 lb (98.9 kg)  01/30/21 215 lb (97.5 kg)  01/28/21 216 lb 11.2 oz (98.3 kg)         Medications: Outpatient Medications Prior to Visit  Medication Sig   allopurinol (ZYLOPRIM) 100 MG tablet TAKE 1 TABLET (100 MG TOTAL) BY MOUTH ONCE DAILY   allopurinol (ZYLOPRIM) 100 MG tablet TAKE 1 TABLET (100 MG TOTAL) BY MOUTH ONCE DAILY   allopurinol (ZYLOPRIM) 100 MG tablet Take 1 tablet (100 mg total) by mouth once daily   enoxaparin (LOVENOX) 40 MG/0.4ML injection Inject 0.4 mLs (40 mg total) into the skin daily for 14 days. (Patient not taking: Reported  on 11/18/2020)   ergocalciferol (VITAMIN D2) 1.25 MG (50000 UT) capsule Take 1 capsule (50,000 Units total) by mouth once a week.   fluticasone (FLONASE) 50 MCG/ACT nasal spray Place 2 sprays into both nostrils daily.   lisinopril (ZESTRIL) 40 MG tablet Take 1 tablet (40 mg total) by mouth daily.   LORazepam (ATIVAN) 0.5 MG tablet TAKE 1 TABLET BY MOUTH TWICE DAILY AS NEEDED.   meloxicam (MOBIC) 7.5 MG tablet TAKE 1 TABLET (7.5 MG TOTAL) BY MOUTH ONCE DAILY   meloxicam (MOBIC) 7.5 MG tablet Take 7.5 mg by mouth daily.   methylPREDNISolone (MEDROL DOSEPAK) 4 MG TBPK  tablet TAKE 6 TABS BY MOUTH ON DAY 1; 5 TABS ON DAY 2; 4 TABS ON DAY 3; 3 TABS ON DAY 4; 2 TABS ON DAY 5; 1 TAB ON DAY 6 THEN STOP (Patient not taking: No sig reported)   metoprolol succinate (TOPROL-XL) 50 MG 24 hr tablet Take 50 mg by mouth daily. Take with or immediately following a meal.   metoprolol succinate (TOPROL-XL) 50 MG 24 hr tablet Take with or immediately following a meal.   nicotine (NICODERM CQ - DOSED IN MG/24 HOURS) 21 mg/24hr patch Place 21 mg onto the skin daily as needed (smoke cessation). (Patient not taking: No sig reported)   Pseudoephedrine-Ibuprofen 30-200 MG TABS Take 1 tablet by mouth every 4 (four) hours as needed. (Patient not taking: No sig reported)   spironolactone (ALDACTONE) 25 MG tablet Take 1 tablet (25 mg total) by mouth daily.   traMADol (ULTRAM) 50 MG tablet Take 1-2 tablets (50-100 mg total) by mouth every 6 (six) hours as needed for moderate pain. (Patient not taking: No sig reported)   No facility-administered medications prior to visit.    Review of Systems  Constitutional:  Negative for activity change and fatigue.  Respiratory:  Negative for cough and shortness of breath.   Cardiovascular:  Negative for chest pain, palpitations and leg swelling.  Endocrine: Negative for cold intolerance, heat intolerance, polydipsia, polyphagia and polyuria.  Musculoskeletal:  Positive for arthralgias and myalgias.  Neurological:  Negative for dizziness, light-headedness and headaches.       Objective  -------------------------------------------------------------------------------------------------------------------- BP (!) 162/66   Pulse 62   Temp 98.6 F (37 C)   Resp 16   Ht '5\' 1"'$  (1.549 m)   Wt 218 lb (98.9 kg)   BMI 41.19 kg/m  BP Readings from Last 3 Encounters:  02/18/21 (!) 162/66  01/28/21 (!) 181/113  11/18/20 (!) 166/84   Wt Readings from Last 3 Encounters:  02/18/21 218 lb (98.9 kg)  01/30/21 215 lb (97.5 kg)  01/28/21 216 lb 11.2 oz  (98.3 kg)       Physical Exam Vitals reviewed.  Constitutional:      Appearance: She is obese.  HENT:     Head: Normocephalic and atraumatic.     Right Ear: External ear normal.     Left Ear: External ear normal.     Nose: Nose normal.  Eyes:     General: No scleral icterus.    Conjunctiva/sclera: Conjunctivae normal.  Cardiovascular:     Rate and Rhythm: Normal rate and regular rhythm.     Heart sounds: Normal heart sounds.  Pulmonary:     Effort: Pulmonary effort is normal.     Breath sounds: Normal breath sounds.  Abdominal:     Palpations: Abdomen is soft.  Musculoskeletal:     Comments: Trace edema on the right.  1+ edema on the  left.   Lymphadenopathy:     Cervical: No cervical adenopathy.  Skin:    General: Skin is warm and dry.  Neurological:     Mental Status: She is alert and oriented to person, place, and time. Mental status is at baseline.  Psychiatric:        Mood and Affect: Mood normal.        Behavior: Behavior normal.        Thought Content: Thought content normal.      No results found for any visits on 02/18/21.  Assessment & Plan  ---------------------------------------------------------------------------------------------------------------------- 1. Essential (primary) hypertension On hydralazine 25 mg twice daily and follow-up in 2 months. - hydrALAZINE (APRESOLINE) 25 MG tablet; Take 1 tablet (25 mg total) by mouth 2 (two) times daily.  Dispense: 60 tablet; Refill: 11 - metoprolol succinate (TOPROL-XL) 50 MG 24 hr tablet; Take with or immediately following a meal.  Dispense: 90 tablet; Refill: 1 - lisinopril (ZESTRIL) 40 MG tablet; Take 1 tablet (40 mg total) by mouth daily.  Dispense: 90 tablet; Refill: 0 - CBC with Differential/Platelet - Comprehensive metabolic panel  2. Hyperglycemia A1c drawn today - Hemoglobin A1c  3. Hyperlipidemia, unspecified hyperlipidemia type Patient declines statin for known ASCVD on scans - Lipid panel -  TSH  4. Primary osteoarthritis of left knee Refer to physical therapy - Ambulatory referral to Physical Therapy  5. Status post total knee replacement, unspecified laterality Followed by orthopedic  6. Prediabetes   7. Personal history of tobacco use, presenting hazards to health Patient advised to quit smoking  8. Current tobacco use   9. Morbid obesity with body mass index (BMI) of 40.0 or higher (HCC) Diet and exercise stressed.   No follow-ups on file.      I, [name], MD, have reviewed all documentation for this visit. The documentation on 02/27/21 for the exam, diagnosis, procedures, and orders are all accurate and complete.    Malva Diesing Cranford Mon, MD  [facility] [phone] (phone) [phone] (fax)  [place]

## 2021-02-19 ENCOUNTER — Other Ambulatory Visit: Payer: Self-pay

## 2021-02-19 DIAGNOSIS — M25569 Pain in unspecified knee: Secondary | ICD-10-CM | POA: Diagnosis not present

## 2021-02-27 DIAGNOSIS — M25569 Pain in unspecified knee: Secondary | ICD-10-CM | POA: Diagnosis not present

## 2021-03-04 DIAGNOSIS — M25569 Pain in unspecified knee: Secondary | ICD-10-CM | POA: Diagnosis not present

## 2021-03-11 DIAGNOSIS — M25569 Pain in unspecified knee: Secondary | ICD-10-CM | POA: Diagnosis not present

## 2021-03-13 DIAGNOSIS — M25569 Pain in unspecified knee: Secondary | ICD-10-CM | POA: Diagnosis not present

## 2021-03-18 DIAGNOSIS — M25569 Pain in unspecified knee: Secondary | ICD-10-CM | POA: Diagnosis not present

## 2021-03-27 ENCOUNTER — Other Ambulatory Visit
Admission: RE | Admit: 2021-03-27 | Discharge: 2021-03-27 | Disposition: A | Discharge status: Home / Self Care | Payer: PPO | Attending: Family Medicine | Admitting: Family Medicine

## 2021-03-27 DIAGNOSIS — E785 Hyperlipidemia, unspecified: Secondary | ICD-10-CM | POA: Diagnosis not present

## 2021-03-27 DIAGNOSIS — I10 Essential (primary) hypertension: Secondary | ICD-10-CM | POA: Insufficient documentation

## 2021-03-27 DIAGNOSIS — R739 Hyperglycemia, unspecified: Secondary | ICD-10-CM | POA: Insufficient documentation

## 2021-03-27 LAB — TSH: TSH: 1.633 u[IU]/mL (ref 0.350–4.500)

## 2021-03-27 LAB — COMPREHENSIVE METABOLIC PANEL
ALT: 12 U/L (ref 0–44)
AST: 13 U/L — ABNORMAL LOW (ref 15–41)
Albumin: 3.5 g/dL (ref 3.5–5.0)
Alkaline Phosphatase: 77 U/L (ref 38–126)
Anion gap: 8 (ref 5–15)
BUN: 21 mg/dL (ref 8–23)
CO2: 24 mmol/L (ref 22–32)
Calcium: 8.7 mg/dL — ABNORMAL LOW (ref 8.9–10.3)
Chloride: 107 mmol/L (ref 98–111)
Creatinine, Ser: 1.14 mg/dL — ABNORMAL HIGH (ref 0.44–1.00)
GFR, Estimated: 53 mL/min — ABNORMAL LOW (ref >60)
Glucose, Bld: 114 mg/dL — ABNORMAL HIGH (ref 70–99)
Potassium: 3.3 mmol/L — ABNORMAL LOW (ref 3.5–5.1)
Sodium: 139 mmol/L (ref 135–145)
Total Bilirubin: 0.6 mg/dL (ref 0.3–1.2)
Total Protein: 7.3 g/dL (ref 6.5–8.1)

## 2021-03-27 LAB — CBC WITH DIFFERENTIAL/PLATELET
Abs Immature Granulocytes: 0.03 10*3/uL (ref 0.00–0.07)
Basophils Absolute: 0.1 10*3/uL (ref 0.0–0.1)
Basophils Relative: 1 %
Eosinophils Absolute: 0.2 10*3/uL (ref 0.0–0.5)
Eosinophils Relative: 2 %
HCT: 35.5 % — ABNORMAL LOW (ref 36.0–46.0)
Hemoglobin: 11.6 g/dL — ABNORMAL LOW (ref 12.0–15.0)
Immature Granulocytes: 0 %
Lymphocytes Relative: 21 %
Lymphs Abs: 2 10*3/uL (ref 0.7–4.0)
MCH: 26.8 pg (ref 26.0–34.0)
MCHC: 32.7 g/dL (ref 30.0–36.0)
MCV: 82 fL (ref 80.0–100.0)
Monocytes Absolute: 0.5 10*3/uL (ref 0.1–1.0)
Monocytes Relative: 6 %
Neutro Abs: 6.7 10*3/uL (ref 1.7–7.7)
Neutrophils Relative %: 70 %
Platelets: 324 10*3/uL (ref 150–400)
RBC: 4.33 MIL/uL (ref 3.87–5.11)
RDW: 15.2 % (ref 11.5–15.5)
WBC: 9.5 10*3/uL (ref 4.0–10.5)
nRBC: 0 % (ref 0.0–0.2)

## 2021-03-27 LAB — LIPID PANEL
Cholesterol: 164 mg/dL (ref 0–200)
HDL: 31 mg/dL — ABNORMAL LOW (ref >40)
LDL Cholesterol: 108 mg/dL — ABNORMAL HIGH (ref 0–99)
Total CHOL/HDL Ratio: 5.3 RATIO
Triglycerides: 126 mg/dL (ref <150)
VLDL: 25 mg/dL (ref 0–40)

## 2021-03-31 ENCOUNTER — Other Ambulatory Visit: Payer: Self-pay

## 2021-03-31 MED ORDER — ALLOPURINOL 100 MG PO TABS
ORAL_TABLET | ORAL | 2 refills | Status: DC
  Filled 2021-03-31: qty 30, 30d supply, fill #0

## 2021-04-21 ENCOUNTER — Other Ambulatory Visit: Payer: Self-pay

## 2021-04-21 DIAGNOSIS — M19079 Primary osteoarthritis, unspecified ankle and foot: Secondary | ICD-10-CM | POA: Diagnosis not present

## 2021-04-21 DIAGNOSIS — M1A00X0 Idiopathic chronic gout, unspecified site, without tophus (tophi): Secondary | ICD-10-CM | POA: Diagnosis not present

## 2021-04-21 DIAGNOSIS — M1990 Unspecified osteoarthritis, unspecified site: Secondary | ICD-10-CM | POA: Diagnosis not present

## 2021-04-21 DIAGNOSIS — M159 Polyosteoarthritis, unspecified: Secondary | ICD-10-CM | POA: Diagnosis not present

## 2021-04-21 MED ORDER — ALLOPURINOL 100 MG PO TABS
ORAL_TABLET | ORAL | 3 refills | Status: DC
  Filled 2021-04-21: qty 90, 90d supply, fill #0

## 2021-04-23 ENCOUNTER — Other Ambulatory Visit: Payer: Self-pay

## 2021-05-06 DIAGNOSIS — Z96653 Presence of artificial knee joint, bilateral: Secondary | ICD-10-CM | POA: Diagnosis not present

## 2021-05-06 DIAGNOSIS — E119 Type 2 diabetes mellitus without complications: Secondary | ICD-10-CM | POA: Diagnosis not present

## 2021-05-06 DIAGNOSIS — Z96652 Presence of left artificial knee joint: Secondary | ICD-10-CM | POA: Diagnosis not present

## 2021-05-06 DIAGNOSIS — Z96651 Presence of right artificial knee joint: Secondary | ICD-10-CM | POA: Diagnosis not present

## 2021-05-20 NOTE — Progress Notes (Signed)
I,Ruth Higgins,acting as a scribe for Ruth Durie, MD.,have documented all relevant documentation on the behalf of Ruth Durie, MD,as directed by  Ruth Durie, MD while in the presence of Ruth Durie, MD.   Established patient visit   Patient: Ruth Higgins   DOB: Dec 21, 1955   65 y.o. Female  MRN: [id number] Visit Date: 05/21/2021  Today's healthcare provider: [name], MD   Chief Complaint  Patient presents with   Follow-up   Hypertension   Subjective    HPI  Noncompliant patient comes in today for follow-up.  She never started hydralazine nor did she start the potassium.  She says there was an insurance issue and she is unable to do the physical therapy although Dr. Marry Guan agreed that this would be the best approach for her leg. Her mother recently died and patient is planning to move to [place] in the near future. He states she is no longer taking Mobic. Rheumatologist Dr. Lyndon Code is retiring and we will take over prescribing the allopurinol going forward for her gout. Hypertension, follow-up  BP Readings from Last 3 Encounters:  05/21/21 (!) 153/98  02/18/21 (!) 162/66  01/28/21 (!) 181/113   Wt Readings from Last 3 Encounters:  05/21/21 215 lb 8 oz (97.8 kg)  02/18/21 218 lb (98.9 kg)  01/30/21 215 lb (97.5 kg)     She was last seen for hypertension 3 months ago.  BP at that visit was 162/66. Management since that visit includes no changes.  She reports fair compliance with treatment. She is not having side effects. none She is following a Regular diet. She is not exercising. She does is smoke.  Use of agents associated with hypertension: none.   Outside blood pressures are not checking.  Pertinent labs: Lab Results  Component Value Date   CHOL 164 03/27/2021   HDL 31 (L) 03/27/2021   LDLCALC 108 (H) 03/27/2021   TRIG 126 03/27/2021   CHOLHDL 5.3 03/27/2021   Lab Results  Component Value Date   NA 139  03/27/2021   K 3.3 (L) 03/27/2021   CREATININE 1.14 (H) 03/27/2021   GFRNONAA 53 (L) 03/27/2021   GLUCOSE 114 (H) 03/27/2021   TSH 1.633 03/27/2021     The 10-year ASCVD risk score (Arnett DK, et al., 2019) is: 37%   ---------------------------------------------------------------------------------------------------     Medications: Outpatient Medications Prior to Visit  Medication Sig   allopurinol (ZYLOPRIM) 100 MG tablet TAKE 1 TABLET (100 MG TOTAL) BY MOUTH ONCE DAILY   ergocalciferol (VITAMIN D2) 1.25 MG (50000 UT) capsule Take 1 capsule (50,000 Units total) by mouth once a week.   meloxicam (MOBIC) 7.5 MG tablet TAKE 1 TABLET (7.5 MG TOTAL) BY MOUTH ONCE DAILY   meloxicam (MOBIC) 7.5 MG tablet Take 7.5 mg by mouth daily.   [DISCONTINUED] allopurinol (ZYLOPRIM) 100 MG tablet TAKE 1 TABLET (100 MG TOTAL) BY MOUTH ONCE DAILY   [DISCONTINUED] allopurinol (ZYLOPRIM) 100 MG tablet Take 1 tablet (100 mg total) by mouth once daily   [DISCONTINUED] allopurinol (ZYLOPRIM) 100 MG tablet Take 1 tablet (100 mg total) by mouth once daily   [DISCONTINUED] fluticasone (FLONASE) 50 MCG/ACT nasal spray Place 2 sprays into both nostrils daily.   [DISCONTINUED] lisinopril (ZESTRIL) 40 MG tablet Take 1 tablet (40 mg total) by mouth daily.   [DISCONTINUED] metoprolol succinate (TOPROL-XL) 50 MG 24 hr tablet Take 50 mg by mouth daily. Take with or immediately following a meal.   [  DISCONTINUED] metoprolol succinate (TOPROL-XL) 50 MG 24 hr tablet Take with or immediately following a meal.   enoxaparin (LOVENOX) 40 MG/0.4ML injection Inject 0.4 mLs (40 mg total) into the skin daily for 14 days. (Patient not taking: Reported on 11/18/2020)   hydrALAZINE (APRESOLINE) 25 MG tablet Take 1 tablet (25 mg total) by mouth 2 (two) times daily. (Patient not taking: Reported on 05/21/2021)   nicotine (NICODERM CQ - DOSED IN MG/24 HOURS) 21 mg/24hr patch Place 21 mg onto the skin daily as needed (smoke cessation).  (Patient not taking: No sig reported)   Pseudoephedrine-Ibuprofen 30-200 MG TABS Take 1 tablet by mouth every 4 (four) hours as needed. (Patient not taking: No sig reported)   traMADol (ULTRAM) 50 MG tablet Take 1-2 tablets (50-100 mg total) by mouth every 6 (six) hours as needed for moderate pain. (Patient not taking: No sig reported)   No facility-administered medications prior to visit.    Review of Systems      Objective    BP (!) 153/98 (BP Location: Right Arm, Patient Position: Sitting, Cuff Size: Normal)   Pulse 71   Temp 97.8 F (36.6 C)   Wt 215 lb 8 oz (97.8 kg)   SpO2 91%   BMI 40.72 kg/m  BP Readings from Last 3 Encounters:  05/21/21 (!) 153/98  02/18/21 (!) 162/66  01/28/21 (!) 181/113   Wt Readings from Last 3 Encounters:  05/21/21 215 lb 8 oz (97.8 kg)  02/18/21 218 lb (98.9 kg)  01/30/21 215 lb (97.5 kg)      Physical Exam Vitals reviewed.  Constitutional:      Appearance: She is obese.  HENT:     Head: Normocephalic and atraumatic.     Right Ear: External ear normal.     Left Ear: External ear normal.     Nose: Nose normal.  Eyes:     General: No scleral icterus.    Conjunctiva/sclera: Conjunctivae normal.  Cardiovascular:     Rate and Rhythm: Normal rate and regular rhythm.     Heart sounds: Normal heart sounds.  Pulmonary:     Effort: Pulmonary effort is normal.     Breath sounds: Normal breath sounds.  Abdominal:     Palpations: Abdomen is soft.  Musculoskeletal:     Comments: Trace edema on the right.  1+ edema on the left.   Lymphadenopathy:     Cervical: No cervical adenopathy.  Skin:    General: Skin is warm and dry.  Neurological:     Mental Status: She is alert and oriented to person, place, and time. Mental status is at baseline.  Psychiatric:        Mood and Affect: Mood normal.        Behavior: Behavior normal.        Thought Content: Thought content normal.    Patient still smoking half a pack per day.  No results found  for any visits on 05/21/21.  Assessment & Plan     1. Essential (primary) hypertension Patient noncompliant with medication.  Vies patient to start the hydralazine we prescribed last visit. - lisinopril (ZESTRIL) 40 MG tablet; Take 1 tablet (40 mg total) by mouth daily.  Dispense: 90 tablet; Refill: 0 - metoprolol succinate (TOPROL-XL) 50 MG 24 hr tablet; Take with or immediately following a meal.  Dispense: 90 tablet; Refill: 1  2. Class 3 severe obesity with serious comorbidity and body mass index (BMI) of 40.0 to 44.9 in adult, unspecified obesity type (  [place]) Diet and exercise has been stressed for years.  3. Status post total knee replacement, unspecified laterality Per Dr. Marry Guan from orthopedic  4. Current tobacco use Patient advised to quit smoking  5. Type 2 diabetes mellitus without complication, without long-term current use of insulin (HCC) S A1c was 6.5.  6. Depression, major, single episode, mild ([place]) Patient would almost certainly benefit from an SSRI or an SNRI.  7. Anxiety, mild  8. Lymphedema  9.  Gout  Return in about 6 months (around 11/18/2021).      I, Ruth Durie, MD, have reviewed all documentation for this visit. The documentation on 05/21/21 for the exam, diagnosis, procedures, and orders are all accurate and complete.    Galena Logie Cranford Mon, MD  [facility] [phone] (phone) [phone] (fax)  [place]

## 2021-05-21 ENCOUNTER — Other Ambulatory Visit: Payer: Self-pay

## 2021-05-21 ENCOUNTER — Ambulatory Visit (INDEPENDENT_AMBULATORY_CARE_PROVIDER_SITE_OTHER): Payer: PPO | Admitting: Family Medicine

## 2021-05-21 VITALS — BP 153/98 | HR 71 | Temp 97.8°F | Wt 215.5 lb

## 2021-05-21 DIAGNOSIS — I890 Lymphedema, not elsewhere classified: Secondary | ICD-10-CM

## 2021-05-21 DIAGNOSIS — I10 Essential (primary) hypertension: Secondary | ICD-10-CM | POA: Diagnosis not present

## 2021-05-21 DIAGNOSIS — F419 Anxiety disorder, unspecified: Secondary | ICD-10-CM

## 2021-05-21 DIAGNOSIS — F320 Major depressive disorder, single episode, mild: Secondary | ICD-10-CM

## 2021-05-21 DIAGNOSIS — Z720 Tobacco use: Secondary | ICD-10-CM

## 2021-05-21 DIAGNOSIS — E119 Type 2 diabetes mellitus without complications: Secondary | ICD-10-CM | POA: Diagnosis not present

## 2021-05-21 DIAGNOSIS — Z6841 Body Mass Index (BMI) 40.0 and over, adult: Secondary | ICD-10-CM | POA: Diagnosis not present

## 2021-05-21 DIAGNOSIS — Z96659 Presence of unspecified artificial knee joint: Secondary | ICD-10-CM

## 2021-05-21 MED ORDER — FLUTICASONE PROPIONATE 50 MCG/ACT NA SUSP
2.0000 | Freq: Every day | NASAL | 2 refills | Status: AC
  Filled 2021-05-21: qty 16, 30d supply, fill #0

## 2021-05-21 MED ORDER — ALLOPURINOL 100 MG PO TABS
ORAL_TABLET | ORAL | 2 refills | Status: AC
  Filled 2021-05-21 – 2021-07-16 (×2): qty 30, 30d supply, fill #0

## 2021-05-21 MED ORDER — METOPROLOL SUCCINATE ER 50 MG PO TB24
ORAL_TABLET | ORAL | 1 refills | Status: DC
  Filled 2021-05-21: qty 90, 90d supply, fill #0
  Filled 2021-07-16: qty 90, 90d supply, fill #1

## 2021-05-21 MED ORDER — LISINOPRIL 40 MG PO TABS
ORAL_TABLET | ORAL | 0 refills | Status: DC
  Filled 2021-05-21: qty 90, 90d supply, fill #0

## 2021-05-21 NOTE — Patient Instructions (Signed)
START HYDRALAZINE. STOP SMOKING!!

## 2021-05-28 DIAGNOSIS — M25569 Pain in unspecified knee: Secondary | ICD-10-CM | POA: Diagnosis not present

## 2021-06-04 DIAGNOSIS — M25569 Pain in unspecified knee: Secondary | ICD-10-CM | POA: Diagnosis not present

## 2021-06-06 DIAGNOSIS — M25569 Pain in unspecified knee: Secondary | ICD-10-CM | POA: Diagnosis not present

## 2021-06-09 DIAGNOSIS — M25569 Pain in unspecified knee: Secondary | ICD-10-CM | POA: Diagnosis not present

## 2021-06-13 DIAGNOSIS — M25569 Pain in unspecified knee: Secondary | ICD-10-CM | POA: Diagnosis not present

## 2021-06-16 DIAGNOSIS — M25569 Pain in unspecified knee: Secondary | ICD-10-CM | POA: Diagnosis not present

## 2021-06-18 DIAGNOSIS — M79672 Pain in left foot: Secondary | ICD-10-CM | POA: Diagnosis not present

## 2021-06-23 DIAGNOSIS — M79672 Pain in left foot: Secondary | ICD-10-CM | POA: Diagnosis not present

## 2021-07-02 DIAGNOSIS — M79672 Pain in left foot: Secondary | ICD-10-CM | POA: Diagnosis not present

## 2021-07-07 DIAGNOSIS — M79672 Pain in left foot: Secondary | ICD-10-CM | POA: Diagnosis not present

## 2021-07-09 DIAGNOSIS — M79672 Pain in left foot: Secondary | ICD-10-CM | POA: Diagnosis not present

## 2021-07-14 DIAGNOSIS — M79672 Pain in left foot: Secondary | ICD-10-CM | POA: Diagnosis not present

## 2021-07-16 ENCOUNTER — Other Ambulatory Visit: Payer: Self-pay

## 2021-07-16 ENCOUNTER — Other Ambulatory Visit: Payer: Self-pay | Admitting: Family Medicine

## 2021-07-16 DIAGNOSIS — I10 Essential (primary) hypertension: Secondary | ICD-10-CM

## 2021-07-16 MED ORDER — LISINOPRIL 40 MG PO TABS
ORAL_TABLET | ORAL | 1 refills | Status: AC
  Filled 2021-07-16 – 2021-07-22 (×2): qty 90, 90d supply, fill #0

## 2021-07-16 MED ORDER — LORAZEPAM 0.5 MG PO TABS
0.5000 mg | ORAL_TABLET | Freq: Two times a day (BID) | ORAL | 2 refills | Status: AC | PRN
  Filled 2021-07-16: qty 60, 30d supply, fill #0

## 2021-07-16 NOTE — Telephone Encounter (Signed)
Requested medication (s) are due for refill today: yes  Requested medication (s) are on the active medication list: no  Last refill:  11/19/20  Future visit scheduled: yes  Notes to clinic:  Unable to refill per protocol, cannot delegate.      Requested Prescriptions  Pending Prescriptions Disp Refills   LORazepam (ATIVAN) 0.5 MG tablet 60 tablet 2    Sig: TAKE 1 TABLET BY MOUTH TWICE DAILY AS NEEDED.     Not Delegated - Psychiatry:  Anxiolytics/Hypnotics Failed - 07/16/2021 12:00 PM      Failed - This refill cannot be delegated      Failed - Urine Drug Screen completed in last 360 days      Passed - Valid encounter within last 6 months    Recent Outpatient Visits           1 month ago Essential (primary) hypertension   [facility] Jerrol Banana., MD   4 months ago Hyperglycemia   [facility] Jerrol Banana., MD   8 months ago Essential (primary) hypertension   [facility] Jerrol Banana., MD   1 year ago Type 2 diabetes mellitus without complication, without long-term current use of insulin [facility])   [facility] Jerrol Banana., MD   1 year ago Annual physical exam   [facility] Carles Collet M, [place]       Future Appointments             In 4 months Jerrol Banana., MD [facility]            Signed Prescriptions Disp Refills   lisinopril (ZESTRIL) 40 MG tablet 90 tablet 1    Sig: Take 1 tablet (40 mg total) by mouth daily.     Cardiovascular:  ACE Inhibitors Failed - 07/16/2021 12:00 PM      Failed - Cr in normal range and within 180 days    Creatinine  Date Value Ref Range Status  02/25/2013 0.96 0.60 - 1.30 mg/dL Final   Creatinine, Ser  Date Value Ref Range Status  03/27/2021 1.14 (H) 0.44 - 1.00 mg/dL Final          Failed - K in normal range and within 180 days    Potassium  Date Value Ref Range Status  03/27/2021  3.3 (L) 3.5 - 5.1 mmol/L Final  02/25/2013 3.5 3.5 - 5.1 mmol/L Final          Failed - Last BP in normal range    BP Readings from Last 1 Encounters:  05/21/21 (!) 153/98          Passed - Patient is not pregnant      Passed - Valid encounter within last 6 months    Recent Outpatient Visits           1 month ago Essential (primary) hypertension   [place] Jerrol Banana., MD   4 months ago Hyperglycemia   [facility] Jerrol Banana., MD   8 months ago Essential (primary) hypertension   [facility] Jerrol Banana., MD   1 year ago Type 2 diabetes mellitus without complication, without long-term current use of insulin [facility])   [facility] Jerrol Banana., MD   1 year ago Annual physical exam   [facility] Carles Collet M, [place]       Future  Appointments             In 4 months Jerrol Banana., MD [facility], [place]

## 2021-07-16 NOTE — Telephone Encounter (Signed)
LOV: 05/21/2021  NOV: 11/18/2021

## 2021-07-16 NOTE — Telephone Encounter (Signed)
Requested Prescriptions  Pending Prescriptions Disp Refills   LORazepam (ATIVAN) 0.5 MG tablet 60 tablet 2    Sig: TAKE 1 TABLET BY MOUTH TWICE DAILY AS NEEDED.     Not Delegated - Psychiatry:  Anxiolytics/Hypnotics Failed - 07/16/2021 12:00 PM      Failed - This refill cannot be delegated      Failed - Urine Drug Screen completed in last 360 days      Passed - Valid encounter within last 6 months    Recent Outpatient Visits          1 month ago Essential (primary) hypertension   [facility] Jerrol Banana., MD   4 months ago Hyperglycemia   [facility] Jerrol Banana., MD   8 months ago Essential (primary) hypertension   [facility] Jerrol Banana., MD   1 year ago Type 2 diabetes mellitus without complication, without long-term current use of insulin [facility])   [facility] Jerrol Banana., MD   1 year ago Annual physical exam   [facility] Carles Collet M, [place]      Future Appointments            In 4 months Jerrol Banana., MD [facility]            lisinopril (ZESTRIL) 40 MG tablet 90 tablet 0    Sig: Take 1 tablet (40 mg total) by mouth daily.     Cardiovascular:  ACE Inhibitors Failed - 07/16/2021 12:00 PM      Failed - Cr in normal range and within 180 days    Creatinine  Date Value Ref Range Status  02/25/2013 0.96 0.60 - 1.30 mg/dL Final   Creatinine, Ser  Date Value Ref Range Status  03/27/2021 1.14 (H) 0.44 - 1.00 mg/dL Final         Failed - K in normal range and within 180 days    Potassium  Date Value Ref Range Status  03/27/2021 3.3 (L) 3.5 - 5.1 mmol/L Final  02/25/2013 3.5 3.5 - 5.1 mmol/L Final         Failed - Last BP in normal range    BP Readings from Last 1 Encounters:  05/21/21 (!) 153/98         Passed - Patient is not pregnant      Passed - Valid encounter within last 6 months    Recent Outpatient  Visits          1 month ago Essential (primary) hypertension   [place] Jerrol Banana., MD   4 months ago Hyperglycemia   [facility] Jerrol Banana., MD   8 months ago Essential (primary) hypertension   [facility] Jerrol Banana., MD   1 year ago Type 2 diabetes mellitus without complication, without long-term current use of insulin [facility])   [facility] Jerrol Banana., MD   1 year ago Annual physical exam   [facility] Trinna Post, [place]      Future Appointments            In 4 months Jerrol Banana., MD [facility], [place]

## 2021-07-22 ENCOUNTER — Other Ambulatory Visit: Payer: Self-pay

## 2021-08-05 ENCOUNTER — Ambulatory Visit (INDEPENDENT_AMBULATORY_CARE_PROVIDER_SITE_OTHER): Payer: PPO | Admitting: Vascular Surgery

## 2021-08-13 ENCOUNTER — Other Ambulatory Visit: Payer: Self-pay

## 2021-08-14 ENCOUNTER — Other Ambulatory Visit: Payer: Self-pay

## 2021-11-10 ENCOUNTER — Other Ambulatory Visit: Payer: Self-pay | Admitting: Family Medicine

## 2021-11-10 DIAGNOSIS — I10 Essential (primary) hypertension: Secondary | ICD-10-CM

## 2021-11-18 ENCOUNTER — Ambulatory Visit: Payer: PPO | Admitting: Family Medicine

## 2022-01-08 DIAGNOSIS — Z6841 Body Mass Index (BMI) 40.0 and over, adult: Secondary | ICD-10-CM | POA: Diagnosis not present

## 2022-01-08 DIAGNOSIS — M79672 Pain in left foot: Secondary | ICD-10-CM | POA: Diagnosis not present

## 2022-01-08 DIAGNOSIS — I890 Lymphedema, not elsewhere classified: Secondary | ICD-10-CM | POA: Diagnosis not present

## 2022-01-08 DIAGNOSIS — M7730 Calcaneal spur, unspecified foot: Secondary | ICD-10-CM | POA: Diagnosis not present

## 2022-01-08 DIAGNOSIS — Z13220 Encounter for screening for lipoid disorders: Secondary | ICD-10-CM | POA: Diagnosis not present

## 2022-01-08 DIAGNOSIS — M79671 Pain in right foot: Secondary | ICD-10-CM | POA: Diagnosis not present

## 2022-01-08 DIAGNOSIS — I10 Essential (primary) hypertension: Secondary | ICD-10-CM | POA: Diagnosis not present

## 2022-01-08 DIAGNOSIS — F17200 Nicotine dependence, unspecified, uncomplicated: Secondary | ICD-10-CM | POA: Diagnosis not present

## 2022-01-08 DIAGNOSIS — Z7689 Persons encountering health services in other specified circumstances: Secondary | ICD-10-CM | POA: Diagnosis not present

## 2022-01-08 DIAGNOSIS — R609 Edema, unspecified: Secondary | ICD-10-CM | POA: Diagnosis not present

## 2022-01-08 DIAGNOSIS — Z79899 Other long term (current) drug therapy: Secondary | ICD-10-CM | POA: Diagnosis not present

## 2022-01-08 DIAGNOSIS — R7303 Prediabetes: Secondary | ICD-10-CM | POA: Diagnosis not present

## 2022-01-30 ENCOUNTER — Ambulatory Visit: Payer: PPO

## 2022-02-19 DIAGNOSIS — Z79899 Other long term (current) drug therapy: Secondary | ICD-10-CM | POA: Diagnosis not present

## 2022-02-19 DIAGNOSIS — M79672 Pain in left foot: Secondary | ICD-10-CM | POA: Diagnosis not present

## 2022-02-19 DIAGNOSIS — Z13220 Encounter for screening for lipoid disorders: Secondary | ICD-10-CM | POA: Diagnosis not present

## 2022-02-19 DIAGNOSIS — R7303 Prediabetes: Secondary | ICD-10-CM | POA: Diagnosis not present

## 2022-02-19 DIAGNOSIS — Z6841 Body Mass Index (BMI) 40.0 and over, adult: Secondary | ICD-10-CM | POA: Diagnosis not present

## 2022-02-19 DIAGNOSIS — I890 Lymphedema, not elsewhere classified: Secondary | ICD-10-CM | POA: Diagnosis not present

## 2022-02-19 DIAGNOSIS — F17210 Nicotine dependence, cigarettes, uncomplicated: Secondary | ICD-10-CM | POA: Diagnosis not present

## 2022-02-19 DIAGNOSIS — Z122 Encounter for screening for malignant neoplasm of respiratory organs: Secondary | ICD-10-CM | POA: Diagnosis not present

## 2022-02-19 DIAGNOSIS — I2510 Atherosclerotic heart disease of native coronary artery without angina pectoris: Secondary | ICD-10-CM | POA: Diagnosis not present

## 2022-02-19 DIAGNOSIS — M79671 Pain in right foot: Secondary | ICD-10-CM | POA: Diagnosis not present

## 2022-02-19 DIAGNOSIS — Z7689 Persons encountering health services in other specified circumstances: Secondary | ICD-10-CM | POA: Diagnosis not present

## 2022-02-19 DIAGNOSIS — M7730 Calcaneal spur, unspecified foot: Secondary | ICD-10-CM | POA: Diagnosis not present

## 2022-02-19 DIAGNOSIS — I10 Essential (primary) hypertension: Secondary | ICD-10-CM | POA: Diagnosis not present

## 2022-03-08 DIAGNOSIS — T465X6A Underdosing of other antihypertensive drugs, initial encounter: Secondary | ICD-10-CM | POA: Diagnosis not present

## 2022-03-08 DIAGNOSIS — N179 Acute kidney failure, unspecified: Secondary | ICD-10-CM | POA: Diagnosis not present

## 2022-03-08 DIAGNOSIS — U071 COVID-19: Secondary | ICD-10-CM | POA: Diagnosis not present

## 2022-03-08 DIAGNOSIS — Z881 Allergy status to other antibiotic agents status: Secondary | ICD-10-CM | POA: Diagnosis not present

## 2022-03-08 DIAGNOSIS — R000 Tachycardia, unspecified: Secondary | ICD-10-CM | POA: Diagnosis not present

## 2022-03-08 DIAGNOSIS — D649 Anemia, unspecified: Secondary | ICD-10-CM | POA: Diagnosis not present

## 2022-03-08 DIAGNOSIS — E860 Dehydration: Secondary | ICD-10-CM | POA: Diagnosis not present

## 2022-03-08 DIAGNOSIS — N200 Calculus of kidney: Secondary | ICD-10-CM | POA: Diagnosis not present

## 2022-03-08 DIAGNOSIS — Z880 Allergy status to penicillin: Secondary | ICD-10-CM | POA: Diagnosis not present

## 2022-03-08 DIAGNOSIS — F17210 Nicotine dependence, cigarettes, uncomplicated: Secondary | ICD-10-CM | POA: Diagnosis not present

## 2022-03-08 DIAGNOSIS — I959 Hypotension, unspecified: Secondary | ICD-10-CM | POA: Diagnosis not present

## 2022-03-08 DIAGNOSIS — I10 Essential (primary) hypertension: Secondary | ICD-10-CM | POA: Diagnosis not present

## 2022-03-09 DIAGNOSIS — E860 Dehydration: Secondary | ICD-10-CM | POA: Diagnosis not present

## 2022-03-09 DIAGNOSIS — N281 Cyst of kidney, acquired: Secondary | ICD-10-CM | POA: Diagnosis not present

## 2022-03-09 DIAGNOSIS — U071 COVID-19: Secondary | ICD-10-CM | POA: Diagnosis not present

## 2022-03-09 DIAGNOSIS — N200 Calculus of kidney: Secondary | ICD-10-CM | POA: Diagnosis not present

## 2022-03-09 DIAGNOSIS — R918 Other nonspecific abnormal finding of lung field: Secondary | ICD-10-CM | POA: Diagnosis not present

## 2022-03-09 DIAGNOSIS — N179 Acute kidney failure, unspecified: Secondary | ICD-10-CM | POA: Diagnosis not present

## 2022-03-09 DIAGNOSIS — I10 Essential (primary) hypertension: Secondary | ICD-10-CM | POA: Diagnosis not present

## 2022-03-10 DIAGNOSIS — N179 Acute kidney failure, unspecified: Secondary | ICD-10-CM | POA: Diagnosis not present

## 2022-03-10 DIAGNOSIS — I10 Essential (primary) hypertension: Secondary | ICD-10-CM | POA: Diagnosis not present

## 2022-03-10 DIAGNOSIS — U071 COVID-19: Secondary | ICD-10-CM | POA: Diagnosis not present

## 2022-03-10 DIAGNOSIS — E860 Dehydration: Secondary | ICD-10-CM | POA: Diagnosis not present

## 2022-04-08 DIAGNOSIS — Z139 Encounter for screening, unspecified: Secondary | ICD-10-CM | POA: Diagnosis not present

## 2022-04-08 DIAGNOSIS — I890 Lymphedema, not elsewhere classified: Secondary | ICD-10-CM | POA: Diagnosis not present

## 2022-04-08 DIAGNOSIS — M79671 Pain in right foot: Secondary | ICD-10-CM | POA: Diagnosis not present

## 2022-04-08 DIAGNOSIS — M79672 Pain in left foot: Secondary | ICD-10-CM | POA: Diagnosis not present

## 2022-04-08 DIAGNOSIS — Z0000 Encounter for general adult medical examination without abnormal findings: Secondary | ICD-10-CM | POA: Diagnosis not present

## 2022-04-08 DIAGNOSIS — Z6839 Body mass index (BMI) 39.0-39.9, adult: Secondary | ICD-10-CM | POA: Diagnosis not present

## 2022-04-08 DIAGNOSIS — Z7689 Persons encountering health services in other specified circumstances: Secondary | ICD-10-CM | POA: Diagnosis not present

## 2022-04-08 DIAGNOSIS — F17200 Nicotine dependence, unspecified, uncomplicated: Secondary | ICD-10-CM | POA: Diagnosis not present

## 2022-04-08 DIAGNOSIS — Z7189 Other specified counseling: Secondary | ICD-10-CM | POA: Diagnosis not present

## 2022-04-08 DIAGNOSIS — Z1331 Encounter for screening for depression: Secondary | ICD-10-CM | POA: Diagnosis not present

## 2022-04-08 DIAGNOSIS — Z713 Dietary counseling and surveillance: Secondary | ICD-10-CM | POA: Diagnosis not present

## 2022-04-08 DIAGNOSIS — I10 Essential (primary) hypertension: Secondary | ICD-10-CM | POA: Diagnosis not present

## 2022-05-04 ENCOUNTER — Encounter (INDEPENDENT_AMBULATORY_CARE_PROVIDER_SITE_OTHER): Payer: Self-pay

## 2022-05-05 ENCOUNTER — Telehealth: Payer: Self-pay | Admitting: *Deleted

## 2022-05-05 NOTE — Telephone Encounter (Signed)
Left message to call back and schedule follow up [facility] CT scan.

## 2022-10-23 ENCOUNTER — Other Ambulatory Visit (HOSPITAL_COMMUNITY): Payer: Self-pay
